# Patient Record
Sex: Female | Born: 1950
Health system: Southern US, Community
[De-identification: ages and names within clinical notes are randomized; demographics above are authoritative.]

## PROBLEM LIST (undated history)

## (undated) DIAGNOSIS — S22000A Wedge compression fracture of unspecified thoracic vertebra, initial encounter for closed fracture: Secondary | ICD-10-CM

## (undated) DIAGNOSIS — C9110 Chronic lymphocytic leukemia of B-cell type not having achieved remission: Secondary | ICD-10-CM

## (undated) DIAGNOSIS — E119 Type 2 diabetes mellitus without complications: Secondary | ICD-10-CM

## (undated) DIAGNOSIS — A64 Unspecified sexually transmitted disease: Secondary | ICD-10-CM

## (undated) DIAGNOSIS — M7122 Synovial cyst of popliteal space [Baker], left knee: Secondary | ICD-10-CM

## (undated) DIAGNOSIS — C801 Malignant (primary) neoplasm, unspecified: Secondary | ICD-10-CM

## (undated) DIAGNOSIS — G709 Myoneural disorder, unspecified: Secondary | ICD-10-CM

## (undated) DIAGNOSIS — J45909 Unspecified asthma, uncomplicated: Secondary | ICD-10-CM

## (undated) DIAGNOSIS — E785 Hyperlipidemia, unspecified: Secondary | ICD-10-CM

## (undated) DIAGNOSIS — G35 Multiple sclerosis: Secondary | ICD-10-CM

## (undated) DIAGNOSIS — A630 Anogenital (venereal) warts: Secondary | ICD-10-CM

## (undated) HISTORY — DX: Wedge compression fracture of unspecified thoracic vertebra, initial encounter for closed fracture: S22.000A

## (undated) HISTORY — PX: ABDOMINAL HYSTERECTOMY: SHX81

## (undated) HISTORY — DX: Chronic lymphocytic leukemia of B-cell type not having achieved remission: C91.10

## (undated) HISTORY — DX: Type 2 diabetes mellitus without complications: E11.9

## (undated) HISTORY — DX: Anogenital (venereal) warts: A63.0

## (undated) HISTORY — DX: Synovial cyst of popliteal space (Baker), left knee: M71.22

## (undated) HISTORY — DX: Unspecified asthma, uncomplicated: J45.909

## (undated) HISTORY — DX: Multiple sclerosis: G35

## (undated) HISTORY — PX: TONSILLECTOMY: SUR1361

## (undated) HISTORY — DX: Hyperlipidemia, unspecified: E78.5

## (undated) HISTORY — DX: Malignant (primary) neoplasm, unspecified: C80.1

## (undated) HISTORY — DX: Unspecified sexually transmitted disease: A64

---

## 2004-04-02 ENCOUNTER — Ambulatory Visit (HOSPITAL_COMMUNITY): Admission: RE | Admit: 2004-04-02 | Discharge: 2004-04-02 | Payer: Self-pay | Admitting: Gastroenterology

## 2004-04-02 ENCOUNTER — Encounter (INDEPENDENT_AMBULATORY_CARE_PROVIDER_SITE_OTHER): Payer: Self-pay | Admitting: Specialist

## 2006-07-18 ENCOUNTER — Encounter: Admission: RE | Admit: 2006-07-18 | Discharge: 2006-10-16 | Payer: Self-pay

## 2009-07-03 ENCOUNTER — Encounter: Payer: Self-pay | Admitting: Internal Medicine

## 2009-09-16 ENCOUNTER — Encounter: Payer: Self-pay | Admitting: Internal Medicine

## 2010-03-29 ENCOUNTER — Encounter: Payer: Self-pay | Admitting: Internal Medicine

## 2010-04-02 ENCOUNTER — Ambulatory Visit: Payer: Self-pay | Admitting: Internal Medicine

## 2010-04-02 ENCOUNTER — Telehealth: Payer: Self-pay | Admitting: Internal Medicine

## 2010-04-02 DIAGNOSIS — G35 Multiple sclerosis: Secondary | ICD-10-CM | POA: Insufficient documentation

## 2010-04-02 DIAGNOSIS — Z8719 Personal history of other diseases of the digestive system: Secondary | ICD-10-CM | POA: Insufficient documentation

## 2010-04-02 DIAGNOSIS — E119 Type 2 diabetes mellitus without complications: Secondary | ICD-10-CM | POA: Insufficient documentation

## 2010-04-02 DIAGNOSIS — J45909 Unspecified asthma, uncomplicated: Secondary | ICD-10-CM | POA: Insufficient documentation

## 2010-04-02 DIAGNOSIS — E785 Hyperlipidemia, unspecified: Secondary | ICD-10-CM | POA: Insufficient documentation

## 2010-04-02 DIAGNOSIS — J309 Allergic rhinitis, unspecified: Secondary | ICD-10-CM | POA: Insufficient documentation

## 2010-04-15 ENCOUNTER — Telehealth: Payer: Self-pay

## 2010-05-27 ENCOUNTER — Ambulatory Visit: Payer: Self-pay | Admitting: Internal Medicine

## 2010-07-20 ENCOUNTER — Telehealth: Payer: Self-pay | Admitting: Internal Medicine

## 2010-12-12 LAB — CONVERTED CEMR LAB
AST: 34 units/L (ref 0–37)
Alkaline Phosphatase: 42 units/L (ref 39–117)
Basophils Relative: 0.3 % (ref 0.0–3.0)
Bilirubin, Direct: 0 mg/dL (ref 0.0–0.3)
CO2: 33 meq/L — ABNORMAL HIGH (ref 19–32)
Calcium: 9.8 mg/dL (ref 8.4–10.5)
Creatinine, Ser: 0.8 mg/dL (ref 0.4–1.2)
Eosinophils Absolute: 0.2 10*3/uL (ref 0.0–0.7)
GFR calc non Af Amer: 79.83 mL/min (ref >60)
Hemoglobin: 12.8 g/dL (ref 12.0–15.0)
Lymphocytes Relative: 28.3 % (ref 12.0–46.0)
MCHC: 34 g/dL (ref 30.0–36.0)
Monocytes Relative: 8.4 % (ref 3.0–12.0)
Neutro Abs: 2.7 10*3/uL (ref 1.4–7.7)
Neutrophils Relative %: 58.4 % (ref 43.0–77.0)
RBC: 4.3 M/uL (ref 3.87–5.11)
Sodium: 147 meq/L — ABNORMAL HIGH (ref 135–145)
Total Protein: 6.7 g/dL (ref 6.0–8.3)
WBC: 4.7 10*3/uL (ref 4.5–10.5)

## 2010-12-16 NOTE — Progress Notes (Signed)
Summary: lisinopril  Phone Note Call from Patient   Caller: Patient Call For: Gordy Savers  MD Reason for Call: Refill Medication Summary of Call: left lisinopril 10 mg once daily off med list - needs refill - [facility] Initial call taken by: Duard Brady LPN,  Apr 02, 2010 9:47 AM  Follow-up for Phone Call        #90 RF 4 Follow-up by: Gordy Savers  MD,  Apr 02, 2010 12:36 PM    New/Updated Medications: LISINOPRIL 20 MG TABS (LISINOPRIL) qd Prescriptions: LISINOPRIL 20 MG TABS (LISINOPRIL) qd  #90 x 3   Entered by:   Duard Brady LPN   Authorized by:   Gordy Savers  MD   Signed by:   Duard Brady LPN on 16/08/9603   Method used:   Faxed to ...       [facility]* (retail)       [address]       [place], [place]  [address]       Ph: [phone] or [phone]       Fax: [phone]   RxID:   [id number]  faxed to pharmacy   KIK

## 2010-12-16 NOTE — Letter (Signed)
Summary: Records from [facility] 2010  Records from [facility] 2010   Imported By: Maryln Gottron 10/01/2010 14:24:14  _____________________________________________________________________  External Attachment:    Type:   Image     Comment:   External Document

## 2010-12-16 NOTE — Assessment & Plan Note (Signed)
Summary: consult re: fall that pt had 4 wks ago/indention in face/cjr   Vital Signs:  Patient profile:   60 year old female Weight:      178 pounds Temp:     98.5 degrees F oral BP sitting:   112 / 70  (left arm) Cuff size:   regular  Vitals Entered By: Duard Brady LPN (May 27, 2010 10:47 AM) CC: fell 7 wks ago - hit [place] with screen door - check "dent"   CC:  fell 7 wks ago - hit [place] with screen door - check "dent".  History of Present Illness: 60 year old patient who is seen today for follow-up.  Says a history of mild type 2 diabetes and  MS.  she fell approximately 7 weeks ago, sustaining facial trauma, especially in the left temporal and periorbital area.  She was concerned about a slight area of indentation involving the left temporal area.  She has had no headaches or any focal neurological symptoms.  Her MS has been stable.  Allergies: 1)  ! Codeine  Past History:  Past Medical History: Reviewed history from 04/02/2010 and no changes required. Allergic rhinitis Asthma Diabetes mellitus, type II Hyperlipidemia multiple sclerosis Rachel Coffey Fax:  [phone] ) Diverticulitis, hx of  Physical Exam  General:  Well-developed,well-nourished,in no acute distress; alert,appropriate and cooperative throughout examination Head:  Normocephalic and atraumatic without obvious abnormalities. No apparent alopecia or balding. the left temporal area was examined and there is no tenderness or abnormalities appreciated Eyes:  No corneal or conjunctival inflammation noted. EOMI. Perrla. Funduscopic exam benign, without hemorrhages, exudates or papilledema. Vision grossly normal. Ears:  External ear exam shows no significant lesions or deformities.  Otoscopic examination reveals clear canals, tympanic membranes are intact bilaterally without bulging, retraction, inflammation or discharge. Hearing is grossly normal bilaterally. Mouth:  Oral mucosa and oropharynx without  lesions or exudates.  Teeth in good repair. Neck:  No deformities, masses, or tenderness noted. Lungs:  Normal respiratory effort, chest expands symmetrically. Lungs are clear to auscultation, no crackles or wheezes.   Impression & Recommendations:  Problem # 1:  MULTIPLE SCLEROSIS (ICD-340)  Problem # 2:  DIABETES MELLITUS, TYPE II (ICD-250.00)  Her updated medication list for this problem includes:    Glipizide 2.5 Mg Xr24h-tab (Glipizide) ..... Bid    Lisinopril 20 Mg Tabs (Lisinopril) ..... Qd  Complete Medication List: 1)  Zolpidem Tartrate 10 Mg Tabs (Zolpidem tartrate) .... One at bedtime as needed for sleep 2)  Trilipix 135 Mg Cpdr (Choline fenofibrate) .... Qhs 3)  Cyclobenzaprine Hcl 10 Mg Tabs (Cyclobenzaprine hcl) .... Qhs 4)  Glipizide 2.5 Mg Xr24h-tab (Glipizide) .... Bid 5)  Vitamin D3 1000 Unit Caps (Cholecalciferol) .... Qd 6)  Cinnamon 500 Mg Tabs (Cinnamon) .... 2 qd 7)  Black Cohosh 540 Mg Caps (Black cohosh) .... Qd 8)  Avonex Prefilled 30 Mcg/0.71ml Kit (Interferon beta-1a) 9)  Lisinopril 20 Mg Tabs (Lisinopril) .... Qd  Patient Instructions: 1)  Please schedule a follow-up appointment in 4 months. 2)  Limit your Sodium (Salt). 3)  It is important that you exercise regularly at least 20 minutes 5 times a week. If you develop chest pain, have severe difficulty breathing, or feel very tired , stop exercising immediately and seek medical attention. 4)  Take calcium +Vitamin D daily. 5)  Check your blood sugars regularly. If your readings are usually above : or below 70 you should contact our office. 6)  It is important that  your Diabetic A1c level is checked every 3 months. 7)  See your eye doctor yearly to check for diabetic eye damage.

## 2010-12-16 NOTE — Assessment & Plan Note (Signed)
Summary: NEW TO EST//CCM pt rsc/njr   Vital Signs:  Patient profile:   60 year old female Height:      67 inches Weight:      180 pounds BMI:     28.29 Temp:     98.0 degrees F oral BP sitting:   118 / 70  (left arm) Cuff size:   regular  Vitals Entered By: Duard Brady LPN (Apr 02, 2010 8:32 AM) CC: new to establish   CC:  new to establish.  History of Present Illness: 60 year old patient who is seen today to establish with our practice.  She is followed by neurology in [place].  Due to MS first diagnosed in 27-Mar-1998.  She has a history of type 2 diabetes, diagnosed in 27-Mar-2004.  She has been on glipizide with much glycemic control.  Her last him go.  A1c.1.  She denies any hypoglycemic symptoms.  She has recently retired.  She has a history of dyslipidemia, controlled on  fibrate therapy  Preventive Screening-Counseling & Management  Alcohol-Tobacco     Smoking Status: quit  Allergies (verified): 1)  ! Codeine  Past History:  Past Medical History: Allergic rhinitis Asthma Diabetes mellitus, type II Hyperlipidemia multiple sclerosis Junie Spencer Fax:  [phone] ) Diverticulitis, hx of  Past Surgical History: Hysterectomy 1992 colonoscopy Mar 27, 2008 Pap smear  2009/03/27 mammogram Mar 27, 2010 Family History: father died age 71, complications of cirrhosis, history of type 2 diabetes mother died Ates 80, palpitations of COPD one sister, history of scoliosis and congenital heart disease, as well as dyslipidemia  Social History: Reviewed history and no changes required. Retired Married discontinued tobacco 5 years agoSmoking Status:  quit  Review of Systems  The patient denies anorexia, fever, weight loss, weight gain, vision loss, decreased hearing, hoarseness, chest pain, syncope, dyspnea on exertion, peripheral edema, prolonged cough, headaches, hemoptysis, abdominal pain, melena, hematochezia, severe indigestion/heartburn, hematuria, incontinence, genital sores, muscle  weakness, suspicious skin lesions, transient blindness, difficulty walking, depression, unusual weight change, abnormal bleeding, enlarged lymph nodes, angioedema, and breast masses.    Physical Exam  General:  Well-developed,well-nourished,in no acute distress; alert,appropriate and cooperative throughout examination;  blood pressure low normal Head:  Normocephalic and atraumatic without obvious abnormalities. No apparent alopecia or balding. Eyes:  No corneal or conjunctival inflammation noted. EOMI. Perrla. Funduscopic exam benign, without hemorrhages, exudates or papilledema. Vision grossly normal. Ears:  External ear exam shows no significant lesions or deformities.  Otoscopic examination reveals clear canals, tympanic membranes are intact bilaterally without bulging, retraction, inflammation or discharge. Hearing is grossly normal bilaterally. Nose:  External nasal examination shows no deformity or inflammation. Nasal mucosa are pink and moist without lesions or exudates. Mouth:  Oral mucosa and oropharynx without lesions or exudates.  Teeth in good repair. Neck:  No deformities, masses, or tenderness noted. Chest Wall:  No deformities, masses, or tenderness noted. Lungs:  Normal respiratory effort, chest expands symmetrically. Lungs are clear to auscultation, no crackles or wheezes. Heart:  Normal rate and regular rhythm. S1 and S2 normal without gallop, murmur, click, rub or other extra sounds. Abdomen:  Bowel sounds positive,abdomen soft and non-tender without masses, organomegaly or hernias noted. Msk:  No deformity or scoliosis noted of thoracic or lumbar spine.   Pulses:  R and L carotid,radial,femoral,dorsalis pedis and posterior tibial pulses are full and equal bilaterally Extremities:  No clubbing, cyanosis, edema, or deformity noted with normal full range of motion of all joints.   Neurologic:  slight diminished  sensation to light touch, plantar aspect, left foot Skin:  Intact  without suspicious lesions or rashes Cervical Nodes:  No lymphadenopathy noted Axillary Nodes:  No palpable lymphadenopathy Inguinal Nodes:  No significant adenopathy Psych:  Cognition and judgment appear intact. Alert and cooperative with normal attention span and concentration. No apparent delusions, illusions, hallucinations  Diabetes Management Exam:    Eye Exam:       Eye Exam done here today          Results: normal   Impression & Recommendations:  Problem # 1:  MULTIPLE SCLEROSIS (ICD-340)  Orders: Venipuncture ([address]) TLB-BMP (Basic Metabolic Panel-BMET) (80048-METABOL) TLB-CBC Platelet - w/Differential (85025-CBCD) TLB-Hepatic/Liver Function Pnl (80076-HEPATIC) TLB-TSH (Thyroid Stimulating Hormone) (84443-TSH)  Problem # 2:  HYPERLIPIDEMIA (ICD-272.4)  Her updated medication list for this problem includes:    Trilipix 135 Mg Cpdr (Choline fenofibrate) ..... Qhs  Orders: Venipuncture (60454) TLB-BMP (Basic Metabolic Panel-BMET) (80048-METABOL) TLB-CBC Platelet - w/Differential (85025-CBCD) TLB-Hepatic/Liver Function Pnl (80076-HEPATIC) TLB-TSH (Thyroid Stimulating Hormone) (84443-TSH)  Problem # 3:  DIABETES MELLITUS, TYPE II (ICD-250.00)  Her updated medication list for this problem includes:    Glipizide 2.5 Mg Xr24h-tab (Glipizide) ..... Bid  Orders: Venipuncture (09811) TLB-BMP (Basic Metabolic Panel-BMET) (80048-METABOL) TLB-CBC Platelet - w/Differential (85025-CBCD) TLB-Hepatic/Liver Function Pnl (80076-HEPATIC) TLB-TSH (Thyroid Stimulating Hormone) (84443-TSH) TLB-A1C / Hgb A1C (Glycohemoglobin) (83036-A1C)  Complete Medication List: 1)  Zolpidem Tartrate 10 Mg Tabs (Zolpidem tartrate) .... One at bedtime as needed for sleep 2)  Trilipix 135 Mg Cpdr (Choline fenofibrate) .... Qhs 3)  Cyclobenzaprine Hcl 10 Mg Tabs (Cyclobenzaprine hcl) .... Qhs 4)  Glipizide 2.5 Mg Xr24h-tab (Glipizide) .... Bid 5)  Vitamin D3 1000 Unit Caps (Cholecalciferol) ....  Qd 6)  Cinnamon 500 Mg Tabs (Cinnamon) .... 2 qd 7)  Black Cohosh 540 Mg Caps (Black cohosh) .... Qd 8)  Avonex Prefilled 30 Mcg/0.32ml Kit (Interferon beta-1a)  Patient Instructions: 1)  Please schedule a follow-up appointment in 3 months. 2)  It is important that you exercise regularly at least 20 minutes 5 times a week. If you develop chest pain, have severe difficulty breathing, or feel very tired , stop exercising immediately and seek medical attention. 3)  Take calcium +Vitamin D daily. 4)  Check your blood sugars regularly. If your readings are usually above : or below 70 you should contact our office. 5)  It is important that your Diabetic A1c level is checked every 3 months. 6)  See your eye doctor yearly to check for diabetic eye damage. Prescriptions: GLIPIZIDE 2.5 MG XR24H-TAB (GLIPIZIDE) bid  #180 x 4   Entered and Authorized by:   Gordy Savers  MD   Signed by:   Gordy Savers  MD on 04/02/2010   Method used:   Print then Give to Patient   RxID:   [id number] CYCLOBENZAPRINE HCL 10 MG TABS (CYCLOBENZAPRINE HCL) qhs  #90 x 4   Entered and Authorized by:   Gordy Savers  MD   Signed by:   Gordy Savers  MD on 04/02/2010   Method used:   Print then Give to Patient   RxID:   [id number] TRILIPIX 135 MG CPDR (CHOLINE FENOFIBRATE) qhs  #90 x 4   Entered and Authorized by:   Gordy Savers  MD   Signed by:   Gordy Savers  MD on 04/02/2010   Method used:   Print then Give to Patient   RxID:   [id number] ZOLPIDEM TARTRATE 10 MG TABS (ZOLPIDEM  TARTRATE) one at bedtime as needed for sleep  #30 x 1   Entered and Authorized by:   Gordy Savers  MD   Signed by:   Gordy Savers  MD on 04/02/2010   Method used:   Print then Give to Patient   RxID:   [id number]

## 2010-12-16 NOTE — Letter (Signed)
Summary: [facility]  [facility]   Imported By: Maryln Gottron 04/07/2010 11:26:29  _____________________________________________________________________  External Attachment:    Type:   Image     Comment:   External Document

## 2010-12-16 NOTE — Progress Notes (Signed)
Summary: refill ventolin hfa  Phone Note Refill Request Message from:  Fax from Pharmacy on July 20, 2010 1:33 PM  ventolin hfa 20mcg/inh aer gm#18 sig: 2 puffs qid as needed  not currently on med list - was being rx'd by Dr. Laqueta Due , they are requesting we fill.  [facility]  [phone]   Method Requested: Fax to Local Pharmacy Initial call taken by: Duard Brady LPN,  July 20, 2010 1:35 PM  Follow-up for Phone Call        OK  RF 2 Follow-up by: Gordy Savers  MD,  July 20, 2010 2:28 PM    New/Updated Medications: VENTOLIN HFA 108 (90 BASE) MCG/ACT AERS (ALBUTEROL SULFATE) 2 puffs qid prn Prescriptions: VENTOLIN HFA 108 (90 BASE) MCG/ACT AERS (ALBUTEROL SULFATE) 2 puffs qid prn  #1 x 2   Entered by:   Duard Brady LPN   Authorized by:   Gordy Savers  MD   Signed by:   Duard Brady LPN on 45/40/9811   Method used:   Faxed to ...       [facility]* (retail)       [address]       [place], [place]  [address]       Ph: [phone] or [phone]       Fax: [phone]   RxID:   [id number]

## 2010-12-16 NOTE — Progress Notes (Signed)
Summary: copy of bood work  Phone Note Call from Patient   Caller: Patient Summary of Call: Please mail or e-mail copy of blood work to [email or web address] Initial call taken by: Kathrynn Speed CMA,  April 15, 2010 10:39 AM  Follow-up for Phone Call        pt aware - copy of labs being mailed KIK Follow-up by: Duard Brady LPN,  April 15, 1609 12:10 PM

## 2011-04-01 NOTE — Op Note (Signed)
NAME:  Rachel Coffey, Rachel Coffey [facility]                         ACCOUNT NO.:  [id number]   MEDICAL RECORD NO.:  [id number]                   PATIENT TYPE:  AMB   LOCATION:  ENDO                                 FACILITY:  [facility]   PHYSICIAN:  John C. Madilyn Fireman, M.D.                 DATE OF BIRTH:  1950/12/23   DATE OF PROCEDURE:  04/02/2004  DATE OF DISCHARGE:                                 OPERATIVE REPORT   PROCEDURE:  Colonoscopy.   INDICATIONS FOR PROCEDURE:  Colon cancer screening in a 60 year old patient  with no prior screening.   DESCRIPTION OF PROCEDURE:  The patient was placed in the left lateral  decubitus position then placed on the pulse monitor with continuous low flow  oxygen delivered by nasal cannula. She was sedated with 100 mcg IV fentanyl  and 10 mg IV Versed. The Olympus video colonoscope was inserted into the  rectum and advanced to the cecum, confirmed by transillumination at  McBurney's point and visualization of the ileocecal valve and appendiceal  orifice. The prep was excellent. The cecum and ascending colon appeared  normal with no masses, polyps, diverticula or other mucosal abnormalities.  Within the transverse colon, there was a 6 mm polyp that was fulgurated by  hot biopsy. The remainder of the transverse, descending and rectum appeared  normal with no further polyps, masses, diverticula or other mucosal  abnormalities.  The scope was then withdrawn and the patient returned to the  recovery room in stable condition. She tolerated the procedure well and  there were no immediate complications.   IMPRESSION:  6 mm transverse colon polyp otherwise normal study.   PLAN:  Await histology to determine method and interval for future colon  screening.                                               John C. Madilyn Fireman, M.D.    [facility]MED[facility]  D:  04/02/2004  T:  04/02/2004  Job:  [id number]   cc:   Laqueta Due, M.D.  [place], [place]

## 2012-02-23 ENCOUNTER — Other Ambulatory Visit: Payer: Self-pay | Admitting: Internal Medicine

## 2012-07-11 ENCOUNTER — Other Ambulatory Visit: Payer: Self-pay | Admitting: Dermatology

## 2012-11-20 ENCOUNTER — Other Ambulatory Visit: Payer: Self-pay | Admitting: Gastroenterology

## 2012-11-20 DIAGNOSIS — R109 Unspecified abdominal pain: Secondary | ICD-10-CM

## 2012-11-20 DIAGNOSIS — R197 Diarrhea, unspecified: Secondary | ICD-10-CM

## 2012-11-21 ENCOUNTER — Other Ambulatory Visit: Payer: Self-pay

## 2012-11-23 ENCOUNTER — Ambulatory Visit
Admission: RE | Admit: 2012-11-23 | Discharge: 2012-11-23 | Disposition: A | Discharge status: Home / Self Care | Payer: 59 | Source: Ambulatory Visit | Attending: Gastroenterology | Admitting: Gastroenterology

## 2012-11-23 DIAGNOSIS — R197 Diarrhea, unspecified: Secondary | ICD-10-CM

## 2012-11-23 DIAGNOSIS — R109 Unspecified abdominal pain: Secondary | ICD-10-CM

## 2013-01-31 ENCOUNTER — Encounter (INDEPENDENT_AMBULATORY_CARE_PROVIDER_SITE_OTHER): Payer: Self-pay | Admitting: Surgery

## 2013-02-06 ENCOUNTER — Encounter (INDEPENDENT_AMBULATORY_CARE_PROVIDER_SITE_OTHER): Payer: Self-pay | Admitting: Surgery

## 2013-02-06 ENCOUNTER — Ambulatory Visit (INDEPENDENT_AMBULATORY_CARE_PROVIDER_SITE_OTHER): Payer: 59 | Admitting: Surgery

## 2013-02-06 VITALS — BP 102/60 | HR 81 | Temp 98.8°F | Resp 18 | Ht 67.5 in | Wt 181.6 lb

## 2013-02-06 DIAGNOSIS — K8010 Calculus of gallbladder with chronic cholecystitis without obstruction: Secondary | ICD-10-CM

## 2013-02-06 NOTE — Progress Notes (Signed)
General Surgery [facility]  Chief Complaint  Patient presents with  . New Evaluation    eval symptomatic GB - referral by Dr. Dorena Cookey and Dr. Sigmund Hazel    HISTORY: Patient is a 62 year old white female referred by her gastroenterologist for consideration for cholecystectomy. Patient has had intermittent episodes of epigastric abdominal pain radiating to the back. These to been associated with nausea, vomiting, and diarrhea. The most severe episode occurred in January 2014. She denies fevers or chills. There is no history of jaundice or acholic stools.  Workup included abdominal ultrasound performed on 11/23/2012. This documented multiple gallstones and probable fatty infiltration of the liver. Laboratory studies show normal liver function test.  Patient has had previous gynecologic surgery. She also has a history of multiple sclerosis and has been on medication. This has somewhat complicated her symptoms.  Past Medical History  Diagnosis Date  . Diabetes mellitus without complication   . Asthma   . Hyperlipidemia   . MS (multiple sclerosis)      Current Outpatient Prescriptions  Medication Sig Dispense Refill  . ALPRAZolam (XANAX) 0.5 MG tablet Take 0.5 mg by mouth at bedtime as needed for sleep.      Marland Kitchen ascorbic acid (VITAMIN C) 500 MG tablet Take 500 mg by mouth daily.      . calcium-vitamin D 250-100 MG-UNIT per tablet Take 1 tablet by mouth 2 (two) times daily.      . Choline Fenofibrate (TRILIPIX) 135 MG capsule Take 135 mg by mouth daily.      . cyclobenzaprine (FLEXERIL) 10 MG tablet Take 10 mg by mouth 3 (three) times daily as needed for muscle spasms.      Marland Kitchen glipiZIDE (GLUCOTROL XL) 2.5 MG 24 hr tablet Take 2.5 mg by mouth daily.      Marland Kitchen lisinopril (PRINIVIL,ZESTRIL) 10 MG tablet Take 10 mg by mouth daily.      . Multiple Vitamins-Minerals (MULTIVITAMIN WITH MINERALS) tablet Take 1 tablet by mouth daily.      . VENTOLIN HFA 108 (90 BASE) MCG/ACT  inhaler INHALE TWO PUFFS FOUR TIMES A DAY AS    NEEDED  1 Inhaler  0   No current facility-administered medications for this visit.     Allergies  Allergen Reactions  . Codeine   . Hydrocodone Itching     Family History  Problem Relation Age of Onset  . COPD Mother   . Heart disease Mother   . Diabetes Father   . Heart disease Sister      History   Social History  . Marital Status: Married    Spouse Name: N/A    Number of Children: N/A  . Years of Education: N/A   Social History Main Topics  . Smoking status: Former Smoker    Types: Cigarettes    Quit date: 01/31/2005  . Smokeless tobacco: Never Used  . Alcohol Use: No  . Drug Use: No  . Sexually Active: None   Other Topics Concern  . None   Social History Narrative  . None     REVIEW OF SYSTEMS - PERTINENT POSITIVES ONLY: Epigastric abdominal pain with radiation to back. Intermittent episodes of nausea and vomiting. Frequent episodes of diarrhea. No history of hepatobiliary or pancreatic disease.  EXAM: Filed Vitals:   02/06/13 0911  BP: 102/60  Pulse: 81  Temp: 98.8 F (37.1 C)  Resp: 18    HEENT: normocephalic; pupils equal and reactive; sclerae clear; dentition good; mucous membranes  moist NECK:  symmetric on extension; no palpable anterior or posterior cervical lymphadenopathy; no supraclavicular masses; no tenderness CHEST: clear to auscultation bilaterally without rales, rhonchi, or wheezes CARDIAC: regular rate and rhythm without significant murmur; peripheral pulses are full ABDOMEN: soft without distension; bowel sounds present; no mass; no hepatosplenomegaly; no hernia; well-healed incisions below umbilicus and in the Pfannenstiel location; mild tenderness in the right upper quadrant and epigastrium EXT:  non-tender without edema; no deformity NEURO: no gross focal deficits; no sign of tremor   LABORATORY RESULTS: See [facility] ([facility]Epic) for most recent results   RADIOLOGY  RESULTS: See [facility] (CHL-Epic) for most recent results   IMPRESSION: #1 symptomatic cholelithiasis, probable chronic cholecystitis #2 personal history of multiple sclerosis  PLAN: I discussed at length with the patient and her husband the indications for surgery. I provided them with written literature to review regarding laparoscopic gallbladder surgery. I have recommended laparoscopic cholecystectomy with intraoperative cholangiography. We discussed risk and benefits of the procedure including the possibility of conversion to open surgery. We have discussed the hospital stay to be anticipated and her recovery. She and her husband understand and wish to proceed with surgery in the near future.  The risks and benefits of the procedure have been discussed at length with the patient.  The patient understands the proposed procedure, potential alternative treatments, and the course of recovery to be expected.  All of the patient's questions have been answered at this time.  The patient wishes to proceed with surgery.  Velora Heckler, MD, FACS General & Endocrine Surgery [facility]   Visit Diagnoses: 1. Cholelithiasis with cholecystitis     Primary Care Physician: Neldon Labella, MD

## 2013-02-06 NOTE — Patient Instructions (Signed)
  [facility]  LAPAROSCOPIC SURGERY - POST-OP INSTRUCTIONS  Always review your discharge instruction sheet given to you by the facility where your surgery was performed.  A prescription for pain medication may be given to you upon discharge.  Take your pain medication as prescribed.  If narcotic pain medicine is not needed, then you may take acetaminophen (Tylenol) or ibuprofen (Advil) as needed.  Take your usually prescribed medications unless otherwise directed.  If you need a refill on your pain medication, please contact your pharmacy.  They will contact our office to request authorization. Prescriptions will not be filled after 5 P.M. or on weekends.  You should follow a light diet the first few days after arrival home, such as soup and crackers or toast.  Be sure to include plenty of fluids daily.  Most patients will experience some swelling and bruising in the area of the incisions.  Ice packs will help.  Swelling and bruising can take several days to resolve.   It is common to experience some constipation if taking pain medication after surgery.  Increasing fluid intake and taking a stool softener (such as Colace) will usually help or prevent this problem from occurring.  A mild laxative (Milk of Magnesia or Miralax) should be taken according to package instructions if there are no bowel movements after 48 hours.  Unless discharge instructions indicate otherwise, you may remove your bandages 24-48 hours after surgery, and you may shower at that time.  You may have steri-strips (small skin tapes) in place directly over the incision.  These strips should be left on the skin for 7-10 days.  If your surgeon used skin glue on the incision, you may shower in 24 hours.  The glue will flake off over the next 2-3 weeks.  Any sutures or staples will be removed at the office during your follow-up visit.  ACTIVITIES:  You may resume regular (light) daily activities beginning the  next day-such as daily self-care, walking, climbing stairs-gradually increasing activities as tolerated.  You may have sexual intercourse when it is comfortable.  Refrain from any heavy lifting or straining until approved by your doctor.  You may drive when you are no longer taking prescription pain medication, you can comfortably wear a seatbelt, and you can safely maneuver your car and apply brakes.  You should see your doctor in the office for a follow-up appointment approximately 2-3 weeks after your surgery.  Make sure that you call for this appointment within a day or two after you arrive home to insure a convenient appointment time.  WHEN TO CALL YOUR DOCTOR: 1. Fever over 101.0 2. Inability to urinate 3. Continued bleeding from incision 4. Increased pain, redness, or drainage from the incision 5. Increasing abdominal pain  The clinic staff is available to answer your questions during regular business hours.  Please don't hesitate to call and ask to speak to one of the nurses for clinical concerns.  If you have a medical emergency, go to the nearest emergency room or call 911.  A surgeon from [facility] is always on call for the hospital.  Jack Bolio M. Amai Cappiello, MD, FACS [facility] Office: [phone] Toll Free:  [phone] FAX [phone]  Web site: [email or web address] 

## 2013-02-11 ENCOUNTER — Encounter (HOSPITAL_COMMUNITY): Payer: Self-pay | Admitting: Pharmacy Technician

## 2013-02-19 ENCOUNTER — Encounter (HOSPITAL_COMMUNITY): Payer: Self-pay

## 2013-02-19 ENCOUNTER — Encounter (HOSPITAL_COMMUNITY)
Admission: RE | Admit: 2013-02-19 | Discharge: 2013-02-19 | Disposition: A | Discharge status: Home / Self Care | Payer: 59 | Source: Ambulatory Visit | Attending: Surgery | Admitting: Surgery

## 2013-02-19 HISTORY — DX: Myoneural disorder, unspecified: G70.9

## 2013-02-19 HISTORY — PX: COLONOSCOPY: SHX174

## 2013-02-19 LAB — BASIC METABOLIC PANEL
BUN: 19 mg/dL (ref 6–23)
Creatinine, Ser: 0.55 mg/dL (ref 0.50–1.10)
GFR calc Af Amer: >90 mL/min (ref >90)
GFR calc non Af Amer: >90 mL/min (ref >90)
Potassium: 4.3 mEq/L (ref 3.5–5.1)

## 2013-02-19 LAB — SURGICAL PCR SCREEN: MRSA, PCR: NEGATIVE

## 2013-02-19 NOTE — Pre-Procedure Instructions (Addendum)
02-19-13 recent labs -3-18-14CBC/d, Lipase, CMP(02-06-13), with chart. BMP done, EKG done today.W.Shreyansh Tiffany,RN

## 2013-02-19 NOTE — Patient Instructions (Addendum)
20 Rachel Coffey  02/19/2013   Your procedure is scheduled on: 4-14  -2014  Report to [facility] at    0530    AM.  Call this number if you have problems the morning of surgery: [phone]  Or Presurgical Testing [phone](Colisha Redler)     Do not eat food:After Midnight.    Take these medicines the morning of surgery with A SIP OF WATER: Bring Ventolin inhaler and use if needed.   Do not wear jewelry, make-up or nail polish.  Do not wear lotions, powders, or perfumes. You may wear deodorant.  Do not shave 12 hours prior to first CHG shower(legs and under arms).(face and neck okay.)  Do not bring valuables to the hospital.  Contacts, dentures or bridgework,body piercing,  may not be worn into surgery.  Leave suitcase in the car. After surgery it may be brought to your room.  For patients admitted to the hospital, checkout time is 11:00 AM the day of discharge.   Patients discharged the day of surgery will not be allowed to drive home. Must have responsible person with you x 24 hours once discharged.  Name and phone number of your driver: mahima, hottle - [phone]  Special Instructions: CHG(Chlorhedine 4%-"Hibiclens","Betasept","Aplicare") Shower Use Special Wash: see special instructions.(avoid face and genitals)   Please read over the following fact sheets that you were given: MRSA Information, Blood Transfusion fact sheet, Incentive Spirometry Instruction.    Failure to follow these instructions may result in Cancellation of your surgery.   Patient signature_______________________________________________________

## 2013-02-21 ENCOUNTER — Encounter (INDEPENDENT_AMBULATORY_CARE_PROVIDER_SITE_OTHER): Payer: Self-pay

## 2013-02-25 ENCOUNTER — Encounter (HOSPITAL_COMMUNITY): Payer: Self-pay | Admitting: Anesthesiology

## 2013-02-25 ENCOUNTER — Encounter (HOSPITAL_COMMUNITY): Payer: Self-pay | Admitting: *Deleted

## 2013-02-25 ENCOUNTER — Observation Stay (HOSPITAL_COMMUNITY)
Admission: RE | Admit: 2013-02-25 | Discharge: 2013-02-26 | Disposition: A | Discharge status: Home / Self Care | Payer: 59 | Source: Ambulatory Visit | Attending: Surgery | Admitting: Surgery

## 2013-02-25 ENCOUNTER — Encounter (HOSPITAL_COMMUNITY)
Admission: RE | Disposition: A | Discharge status: Home / Self Care | Payer: Self-pay | Source: Ambulatory Visit | Attending: Surgery

## 2013-02-25 ENCOUNTER — Ambulatory Visit (HOSPITAL_COMMUNITY): Payer: 59 | Admitting: Anesthesiology

## 2013-02-25 ENCOUNTER — Ambulatory Visit (HOSPITAL_COMMUNITY): Payer: 59

## 2013-02-25 DIAGNOSIS — Z79899 Other long term (current) drug therapy: Secondary | ICD-10-CM | POA: Insufficient documentation

## 2013-02-25 DIAGNOSIS — Z01810 Encounter for preprocedural cardiovascular examination: Secondary | ICD-10-CM | POA: Insufficient documentation

## 2013-02-25 DIAGNOSIS — K8010 Calculus of gallbladder with chronic cholecystitis without obstruction: Principal | ICD-10-CM | POA: Diagnosis present

## 2013-02-25 DIAGNOSIS — E119 Type 2 diabetes mellitus without complications: Secondary | ICD-10-CM | POA: Insufficient documentation

## 2013-02-25 DIAGNOSIS — E785 Hyperlipidemia, unspecified: Secondary | ICD-10-CM | POA: Insufficient documentation

## 2013-02-25 DIAGNOSIS — Z01812 Encounter for preprocedural laboratory examination: Secondary | ICD-10-CM | POA: Insufficient documentation

## 2013-02-25 DIAGNOSIS — G35 Multiple sclerosis: Secondary | ICD-10-CM | POA: Insufficient documentation

## 2013-02-25 HISTORY — PX: CHOLECYSTECTOMY: SHX55

## 2013-02-25 LAB — GLUCOSE, CAPILLARY
Glucose-Capillary: 150 mg/dL — ABNORMAL HIGH (ref 70–99)
Glucose-Capillary: 113 mg/dL — ABNORMAL HIGH (ref 70–99)

## 2013-02-25 SURGERY — LAPAROSCOPIC CHOLECYSTECTOMY WITH INTRAOPERATIVE CHOLANGIOGRAM
Anesthesia: General | Site: Abdomen | Wound class: Contaminated

## 2013-02-25 MED ORDER — CEFAZOLIN SODIUM-DEXTROSE 2-3 GM-% IV SOLR
2.0000 g | INTRAVENOUS | Status: AC
  Administered 2013-02-25: 2 g via INTRAVENOUS

## 2013-02-25 MED ORDER — GLYCOPYRROLATE 0.2 MG/ML IJ SOLN
INTRAMUSCULAR | Status: DC | PRN
  Administered 2013-02-25 (×2): .3 mg via INTRAVENOUS

## 2013-02-25 MED ORDER — SODIUM CHLORIDE 0.9 % IJ SOLN
INTRAMUSCULAR | Status: DC | PRN
  Administered 2013-02-25: 08:00:00

## 2013-02-25 MED ORDER — FENTANYL CITRATE 0.05 MG/ML IJ SOLN
INTRAMUSCULAR | Status: DC | PRN
  Administered 2013-02-25: 25 ug via INTRAVENOUS
  Administered 2013-02-25: 100 ug via INTRAVENOUS
  Administered 2013-02-25: 25 ug via INTRAVENOUS
  Administered 2013-02-25: 100 ug via INTRAVENOUS

## 2013-02-25 MED ORDER — IOHEXOL 300 MG/ML  SOLN
INTRAMUSCULAR | Status: AC
  Filled 2013-02-25: qty 1

## 2013-02-25 MED ORDER — ALPRAZOLAM 0.5 MG PO TABS: 0.5000 mg | ORAL_TABLET | Freq: Every day | ORAL | Status: DC | PRN

## 2013-02-25 MED ORDER — KCL IN DEXTROSE-NACL 20-5-0.45 MEQ/L-%-% IV SOLN
INTRAVENOUS | Status: DC
  Administered 2013-02-25 (×2): via INTRAVENOUS
  Filled 2013-02-25 (×2): qty 1000

## 2013-02-25 MED ORDER — HYDROMORPHONE HCL PF 1 MG/ML IJ SOLN
0.2500 mg | INTRAMUSCULAR | Status: DC | PRN
  Administered 2013-02-25 (×2): 0.5 mg via INTRAVENOUS

## 2013-02-25 MED ORDER — LIDOCAINE HCL (CARDIAC) 20 MG/ML IV SOLN
INTRAVENOUS | Status: DC | PRN
  Administered 2013-02-25: 80 mg via INTRAVENOUS

## 2013-02-25 MED ORDER — LACTATED RINGERS IR SOLN
Status: DC | PRN
  Administered 2013-02-25: 1

## 2013-02-25 MED ORDER — GLIPIZIDE ER 2.5 MG PO TB24
2.5000 mg | ORAL_TABLET | Freq: Two times a day (BID) | ORAL | Status: DC
  Administered 2013-02-25 – 2013-02-26 (×2): 2.5 mg via ORAL
  Filled 2013-02-25 (×4): qty 1

## 2013-02-25 MED ORDER — INSULIN ASPART 100 UNIT/ML ~~LOC~~ SOLN: 0.0000 [IU] | Freq: Three times a day (TID) | SUBCUTANEOUS | Status: DC

## 2013-02-25 MED ORDER — PROMETHAZINE HCL 25 MG/ML IJ SOLN
6.2500 mg | INTRAMUSCULAR | Status: DC | PRN
  Administered 2013-02-25: 12.5 mg via INTRAVENOUS

## 2013-02-25 MED ORDER — ONDANSETRON HCL 4 MG/2ML IJ SOLN
INTRAMUSCULAR | Status: DC | PRN
  Administered 2013-02-25: 4 mg via INTRAVENOUS

## 2013-02-25 MED ORDER — CYCLOBENZAPRINE HCL 10 MG PO TABS
10.0000 mg | ORAL_TABLET | Freq: Three times a day (TID) | ORAL | Status: DC | PRN
  Filled 2013-02-25: qty 1

## 2013-02-25 MED ORDER — INSULIN ASPART 100 UNIT/ML ~~LOC~~ SOLN: 0.0000 [IU] | Freq: Every day | SUBCUTANEOUS | Status: DC

## 2013-02-25 MED ORDER — LACTATED RINGERS IV SOLN
INTRAVENOUS | Status: DC | PRN
  Administered 2013-02-25: 07:00:00 via INTRAVENOUS

## 2013-02-25 MED ORDER — CEFAZOLIN SODIUM-DEXTROSE 2-3 GM-% IV SOLR
INTRAVENOUS | Status: AC
  Filled 2013-02-25: qty 50

## 2013-02-25 MED ORDER — HYDROMORPHONE HCL PF 1 MG/ML IJ SOLN
INTRAMUSCULAR | Status: AC
  Filled 2013-02-25: qty 2

## 2013-02-25 MED ORDER — PROPOFOL 10 MG/ML IV BOLUS
INTRAVENOUS | Status: DC | PRN
  Administered 2013-02-25: 120 mg via INTRAVENOUS

## 2013-02-25 MED ORDER — NEOSTIGMINE METHYLSULFATE 1 MG/ML IJ SOLN
INTRAMUSCULAR | Status: DC | PRN
  Administered 2013-02-25 (×2): 2 mg via INTRAVENOUS

## 2013-02-25 MED ORDER — TRAMADOL HCL 50 MG PO TABS
50.0000 mg | ORAL_TABLET | Freq: Four times a day (QID) | ORAL | Status: DC | PRN
  Administered 2013-02-25 – 2013-02-26 (×3): 50 mg via ORAL
  Filled 2013-02-25 (×3): qty 1

## 2013-02-25 MED ORDER — ALBUTEROL SULFATE HFA 108 (90 BASE) MCG/ACT IN AERS
2.0000 | INHALATION_SPRAY | Freq: Four times a day (QID) | RESPIRATORY_TRACT | Status: DC | PRN
  Filled 2013-02-25: qty 6.7

## 2013-02-25 MED ORDER — PROMETHAZINE HCL 25 MG/ML IJ SOLN
12.5000 mg | INTRAMUSCULAR | Status: DC | PRN
  Administered 2013-02-26: 12.5 mg via INTRAVENOUS
  Filled 2013-02-25: qty 1

## 2013-02-25 MED ORDER — ACETAMINOPHEN 325 MG PO TABS: 650.0000 mg | ORAL_TABLET | ORAL | Status: DC | PRN

## 2013-02-25 MED ORDER — KCL IN DEXTROSE-NACL 20-5-0.45 MEQ/L-%-% IV SOLN
INTRAVENOUS | Status: AC
  Administered 2013-02-25: 10:00:00
  Filled 2013-02-25: qty 1000

## 2013-02-25 MED ORDER — 0.9 % SODIUM CHLORIDE (POUR BTL) OPTIME
TOPICAL | Status: DC | PRN
  Administered 2013-02-25: 1000 mL

## 2013-02-25 MED ORDER — PROMETHAZINE HCL 25 MG/ML IJ SOLN
INTRAMUSCULAR | Status: AC
  Filled 2013-02-25: qty 1

## 2013-02-25 MED ORDER — LISINOPRIL 10 MG PO TABS
10.0000 mg | ORAL_TABLET | Freq: Every morning | ORAL | Status: DC
  Administered 2013-02-25 – 2013-02-26 (×2): 10 mg via ORAL
  Filled 2013-02-25 (×2): qty 1

## 2013-02-25 MED ORDER — ROCURONIUM BROMIDE 100 MG/10ML IV SOLN
INTRAVENOUS | Status: DC | PRN
  Administered 2013-02-25: 25 mg via INTRAVENOUS
  Administered 2013-02-25: 10 mg via INTRAVENOUS

## 2013-02-25 MED ORDER — BUPIVACAINE-EPINEPHRINE 0.25% -1:200000 IJ SOLN
INTRAMUSCULAR | Status: AC
  Filled 2013-02-25: qty 1

## 2013-02-25 MED ORDER — BUPIVACAINE-EPINEPHRINE 0.5% -1:200000 IJ SOLN
INTRAMUSCULAR | Status: DC | PRN
  Administered 2013-02-25: 20 mL

## 2013-02-25 MED ORDER — HYDROMORPHONE HCL PF 1 MG/ML IJ SOLN
1.0000 mg | INTRAMUSCULAR | Status: DC | PRN
  Administered 2013-02-25 – 2013-02-26 (×4): 1 mg via INTRAVENOUS
  Filled 2013-02-25 (×4): qty 1

## 2013-02-25 SURGICAL SUPPLY — 35 items
APPLIER CLIP ROT 10 11.4 M/L (STAPLE) ×2
BENZOIN TINCTURE PRP APPL 2/3 (GAUZE/BANDAGES/DRESSINGS) ×2 IMPLANT
CABLE HIGH FREQUENCY MONO STRZ (ELECTRODE) ×2 IMPLANT
CANISTER SUCTION 2500CC (MISCELLANEOUS) ×2 IMPLANT
CHLORAPREP W/TINT 26ML (MISCELLANEOUS) ×2 IMPLANT
CLIP APPLIE ROT 10 11.4 M/L (STAPLE) ×1 IMPLANT
CLOTH BEACON ORANGE TIMEOUT ST (SAFETY) ×2 IMPLANT
COVER MAYO STAND STRL (DRAPES) ×2 IMPLANT
DECANTER SPIKE VIAL GLASS SM (MISCELLANEOUS) ×2 IMPLANT
DRAPE C-ARM 42X72 X-RAY (DRAPES) ×2 IMPLANT
DRAPE LAPAROSCOPIC ABDOMINAL (DRAPES) ×2 IMPLANT
DRAPE UTILITY XL STRL (DRAPES) ×2 IMPLANT
ELECT REM PT RETURN 9FT ADLT (ELECTROSURGICAL) ×2
ELECTRODE REM PT RTRN 9FT ADLT (ELECTROSURGICAL) ×1 IMPLANT
GLOVE BIOGEL PI IND STRL 7.0 (GLOVE) ×1 IMPLANT
GLOVE BIOGEL PI INDICATOR 7.0 (GLOVE) ×1
GLOVE SURG ORTHO 8.0 STRL STRW (GLOVE) ×2 IMPLANT
GOWN STRL NON-REIN LRG LVL3 (GOWN DISPOSABLE) IMPLANT
GOWN STRL REIN XL XLG (GOWN DISPOSABLE) ×6 IMPLANT
HEMOSTAT SURGICEL 4X8 (HEMOSTASIS) IMPLANT
KIT BASIN OR (CUSTOM PROCEDURE TRAY) ×2 IMPLANT
NS IRRIG 1000ML POUR BTL (IV SOLUTION) ×2 IMPLANT
POUCH SPECIMEN RETRIEVAL 10MM (ENDOMECHANICALS) ×2 IMPLANT
SCISSORS LAP 5X35 DISP (ENDOMECHANICALS) IMPLANT
SET CHOLANGIOGRAPH MIX (MISCELLANEOUS) ×2 IMPLANT
SET IRRIG TUBING LAPAROSCOPIC (IRRIGATION / IRRIGATOR) ×2 IMPLANT
SLEEVE XCEL OPT CAN 5 100 (ENDOMECHANICALS) ×2 IMPLANT
SOLUTION ANTI FOG 6CC (MISCELLANEOUS) ×2 IMPLANT
STRIP CLOSURE SKIN 1/2X4 (GAUZE/BANDAGES/DRESSINGS) ×2 IMPLANT
SUT MNCRL AB 4-0 PS2 18 (SUTURE) ×2 IMPLANT
TOWEL OR 17X26 10 PK STRL BLUE (TOWEL DISPOSABLE) ×2 IMPLANT
TRAY LAP CHOLE (CUSTOM PROCEDURE TRAY) ×2 IMPLANT
TROCAR XCEL BLUNT TIP 100MML (ENDOMECHANICALS) ×2 IMPLANT
TROCAR XCEL NON-BLD 11X100MML (ENDOMECHANICALS) ×2 IMPLANT
TUBING INSUFFLATION 10FT LAP (TUBING) ×2 IMPLANT

## 2013-02-25 NOTE — Interval H&P Note (Signed)
History and Physical Interval Note:  02/25/2013 7:20 AM  Rachel Coffey  has presented today for surgery, with the diagnosis of cholelithiasis, chronic cholecystitis.  The various methods of treatment have been discussed with the patient and family. After consideration of risks, benefits and other options for treatment, the patient has consented to    Procedure(s): LAPAROSCOPIC CHOLECYSTECTOMY WITH INTRAOPERATIVE CHOLANGIOGRAM (N/A) as a surgical intervention .    The patient's history has been reviewed, patient examined, no change in status, stable for surgery.  I have reviewed the patient's chart and labs.  Questions were answered to the patient's satisfaction.    Velora Heckler, MD, [facility] Office: [phone]    [name]

## 2013-02-25 NOTE — Op Note (Signed)
Procedure Note  Pre-operative Diagnosis: Calculus of gallbladder with other cholecystitis, without mention of obstruction  Post-operative Diagnosis: Same  Surgeon:  Velora Heckler, MD, FACS  Procedure:  Laparoscopic cholecystectomy with intra-operative cholangiography  Assistant:  none   Anesthesia:  General  Indications: This patient presents with symptomatic gallbladder disease and will undergo laparoscopic cholecystectomy with intraoperative cholangiography.  Procedure Details: The patient was seen in the pre-op holding area. The risks, benefits, complications, treatment options, and expected outcomes have been discussed with the patient. The patient and/or family agreed with the proposed plan and signed the informed consent form.  The patient was taken to Operating Room, identified as Rachel Coffey and the procedure verified as Laparoscopic Cholecystectomy with Intraoperative Cholangiogram. A "time out" was completed and the above information confirmed.  Prior to the induction of general anesthesia, antibiotic prophylaxis was administered. General endotracheal anesthesia was then administered and tolerated well. After the induction, the abdomen was prepped in the usual strict aseptic fashion. The patient was in the supine position.  An incision was made in the skin near the umbilicus. The midline fascia was incised and the peritoneal cavity entered and the Hasson canula was introduced under direct vision. Hasson canula was secured with a pursestring 0-Vicryl suture. Pneumoperitoneum was then established with carbon dioxide and tolerated well without any adverse changes in the patient's vital signs. Additional trocars were introduced under direct vision along the right costal margin in the midline, mid-clavicular line, and anterior axillary line.  The gallbladder was identified and the fundus grasped and retracted cephalad. Adhesions were taken down bluntly and with the electrocautery  as needed, taking care not to injure any adjacent structures. The infundibulum was grasped and retracted laterally, exposing the peritoneum overlying the triangle of Calot. This was incised and structures exposed in a blunt fashion. The cystic duct was clearly identified and bluntly dissected circumferentially and clipped at the neck of the gallbladder.  An incision was made in the cystic duct and the cholangiogram catheter introduced. The catheter was secured using an ligaclip.  Real-time cholangiography was performed using the C-arm.  There was rapid filling of a normal caliber common bile duct.  There was reflux of contrast into the left and right hepatic ductal systems.  There was free flow distally into the duodenum without filling defect or obstruction.  Catheter was removed from the peritoneal cavity.  The cystic duct was then triply ligated with surgical clips and divided. The cystic artery was identified, dissected circumferentially, ligated with ligaclips, and divided.   The gallbladder was dissected from the liver bed with the electrocautery used for hemostasis. The gallbladder was completely removed and placed into an endocatch bag. The right upper quadrant was irrigated and inspected. Hemostasis was achieved with the electrocautery. Warm saline irrigation was utilized and was repeatedly aspirated until clear.  Pneumoperitoneum was released after viewing removal of the trocars with good hemostasis noted. The umbilical wound was irrigated and the fascia was then closed with the pursestring suture.  The skin was then closed with 4-0 Monocril subcuticular sutures and sterile dressings were applied.  Instrument, sponge, and needle counts were correct at the conclusion of the case.  The patient tolerated the procedure well.  Estimated Blood Loss: Minimal         Drains: none         Specimens: Gallbladder to pathology         Disposition: PACU - hemodynamically stable.         Condition:  stable   Velora Heckler, MD, [facility] Office: [phone]

## 2013-02-25 NOTE — Anesthesia Preprocedure Evaluation (Addendum)
Anesthesia Evaluation  Patient identified by MRN, date of birth, ID band Patient awake    Reviewed: Allergy & Precautions, H&P , NPO status , Patient's Chart, lab work & pertinent test results, reviewed documented beta blocker date and time   Airway Mallampati: II TM Distance: >3 FB Neck ROM: full    Dental no notable dental hx.    Pulmonary asthma ,  Rare asthma symptoms. breath sounds clear to auscultation  Pulmonary exam normal       Cardiovascular Exercise Tolerance: Good negative cardio ROS  Rhythm:regular Rate:Normal     Neuro/Psych Multiple Sclerosis. Symptoms are neuropathy of feet (mostly tingling).  Neuromuscular disease negative psych ROS   GI/Hepatic negative GI ROS, Neg liver ROS,   Endo/Other  diabetes, Type 2, Oral Hypoglycemic Agents  Renal/GU negative Renal ROS  negative genitourinary   Musculoskeletal   Abdominal   Peds  Hematology negative hematology ROS (+)   Anesthesia Other Findings   Reproductive/Obstetrics negative OB ROS                          Anesthesia Physical Anesthesia Plan  ASA: III  Anesthesia Plan: General   Post-op Pain Management:    Induction: Intravenous  Airway Management Planned: Oral ETT  Additional Equipment:   Intra-op Plan:   Post-operative Plan: Extubation in OR  Informed Consent: I have reviewed the patients History and Physical, chart, labs and discussed the procedure including the risks, benefits and alternatives for the proposed anesthesia with the patient or authorized representative who has indicated his/her understanding and acceptance.   Dental Advisory Given  Plan Discussed with: CRNA  Anesthesia Plan Comments:         Anesthesia Quick Evaluation

## 2013-02-25 NOTE — Preoperative (Signed)
Beta Blockers   Reason not to administer Beta Blockers:Not Applicable 

## 2013-02-25 NOTE — Transfer of Care (Signed)
Immediate Anesthesia Transfer of Care Note  Patient: Rachel Coffey  Procedure(s) Performed: Procedure(s): LAPAROSCOPIC CHOLECYSTECTOMY WITH INTRAOPERATIVE CHOLANGIOGRAM (N/A)  Patient Location: PACU  Anesthesia Type:General  Level of Consciousness: awake and alert   Airway & Oxygen Therapy: Patient Spontanous Breathing and Patient connected to face mask oxygen  Post-op Assessment: Report given to PACU RN and Post -op Vital signs reviewed and stable  Post vital signs: Reviewed and stable  Complications: No apparent anesthesia complications

## 2013-02-25 NOTE — Anesthesia Postprocedure Evaluation (Signed)
  Anesthesia Post-op Note  Patient: Rachel Coffey  Procedure(s) Performed: Procedure(s) (LRB): LAPAROSCOPIC CHOLECYSTECTOMY WITH INTRAOPERATIVE CHOLANGIOGRAM (N/A)  Patient Location: PACU  Anesthesia Type: General  Level of Consciousness: awake and alert   Airway and Oxygen Therapy: Patient Spontanous Breathing  Post-op Pain: mild  Post-op Assessment: Post-op Vital signs reviewed, Patient's Cardiovascular Status Stable, Respiratory Function Stable, Patent Airway and No signs of Nausea or vomiting  Last Vitals:  Filed Vitals:   02/25/13 1000  BP: 123/63  Pulse:   Temp:   Resp:     Post-op Vital Signs: stable   Complications: No apparent anesthesia complications

## 2013-02-25 NOTE — H&P (View-Only) (Signed)
General Surgery - [facility]  Chief Complaint  Patient presents with  . New Evaluation    eval symptomatic GB - referral by Dr. John Hayes and Dr. Lisa Miller    HISTORY: Patient is a 61-year-old white female referred by her gastroenterologist for consideration for cholecystectomy. Patient has had intermittent episodes of epigastric abdominal pain radiating to the back. These to been associated with nausea, vomiting, and diarrhea. The most severe episode occurred in January 2014. She denies fevers or chills. There is no history of jaundice or acholic stools.  Workup included abdominal ultrasound performed on 11/23/2012. This documented multiple gallstones and probable fatty infiltration of the liver. Laboratory studies show normal liver function test.  Patient has had previous gynecologic surgery. She also has a history of multiple sclerosis and has been on medication. This has somewhat complicated her symptoms.  Past Medical History  Diagnosis Date  . Diabetes mellitus without complication   . Asthma   . Hyperlipidemia   . MS (multiple sclerosis)      Current Outpatient Prescriptions  Medication Sig Dispense Refill  . ALPRAZolam (XANAX) 0.5 MG tablet Take 0.5 mg by mouth at bedtime as needed for sleep.      . ascorbic acid (VITAMIN C) 500 MG tablet Take 500 mg by mouth daily.      . calcium-vitamin D 250-100 MG-UNIT per tablet Take 1 tablet by mouth 2 (two) times daily.      . Choline Fenofibrate (TRILIPIX) 135 MG capsule Take 135 mg by mouth daily.      . cyclobenzaprine (FLEXERIL) 10 MG tablet Take 10 mg by mouth 3 (three) times daily as needed for muscle spasms.      . glipiZIDE (GLUCOTROL XL) 2.5 MG 24 hr tablet Take 2.5 mg by mouth daily.      . lisinopril (PRINIVIL,ZESTRIL) 10 MG tablet Take 10 mg by mouth daily.      . Multiple Vitamins-Minerals (MULTIVITAMIN WITH MINERALS) tablet Take 1 tablet by mouth daily.      . VENTOLIN HFA 108 (90 BASE) MCG/ACT  inhaler INHALE TWO PUFFS FOUR TIMES A DAY AS    NEEDED  1 Inhaler  0   No current facility-administered medications for this visit.     Allergies  Allergen Reactions  . Codeine   . Hydrocodone Itching     Family History  Problem Relation Age of Onset  . COPD Mother   . Heart disease Mother   . Diabetes Father   . Heart disease Sister      History   Social History  . Marital Status: Married    Spouse Name: N/A    Number of Children: N/A  . Years of Education: N/A   Social History Main Topics  . Smoking status: Former Smoker    Types: Cigarettes    Quit date: 01/31/2005  . Smokeless tobacco: Never Used  . Alcohol Use: No  . Drug Use: No  . Sexually Active: None   Other Topics Concern  . None   Social History Narrative  . None     REVIEW OF SYSTEMS - PERTINENT POSITIVES ONLY: Epigastric abdominal pain with radiation to back. Intermittent episodes of nausea and vomiting. Frequent episodes of diarrhea. No history of hepatobiliary or pancreatic disease.  EXAM: Filed Vitals:   02/06/13 0911  BP: 102/60  Pulse: 81  Temp: 98.8 F (37.1 C)  Resp: 18    HEENT: normocephalic; pupils equal and reactive; sclerae clear; dentition good; mucous membranes   moist NECK:  symmetric on extension; no palpable anterior or posterior cervical lymphadenopathy; no supraclavicular masses; no tenderness CHEST: clear to auscultation bilaterally without rales, rhonchi, or wheezes CARDIAC: regular rate and rhythm without significant murmur; peripheral pulses are full ABDOMEN: soft without distension; bowel sounds present; no mass; no hepatosplenomegaly; no hernia; well-healed incisions below umbilicus and in the Pfannenstiel location; mild tenderness in the right upper quadrant and epigastrium EXT:  non-tender without edema; no deformity NEURO: no gross focal deficits; no sign of tremor   LABORATORY RESULTS: See [facility] (CHL-Epic) for most recent results   RADIOLOGY  RESULTS: See [facility] (CHL-Epic) for most recent results   IMPRESSION: #1 symptomatic cholelithiasis, probable chronic cholecystitis #2 personal history of multiple sclerosis  PLAN: I discussed at length with the patient and her husband the indications for surgery. I provided them with written literature to review regarding laparoscopic gallbladder surgery. I have recommended laparoscopic cholecystectomy with intraoperative cholangiography. We discussed risk and benefits of the procedure including the possibility of conversion to open surgery. We have discussed the hospital stay to be anticipated and her recovery. She and her husband understand and wish to proceed with surgery in the near future.  The risks and benefits of the procedure have been discussed at length with the patient.  The patient understands the proposed procedure, potential alternative treatments, and the course of recovery to be expected.  All of the patient's questions have been answered at this time.  The patient wishes to proceed with surgery.  Creek Gan M. Angeni Chaudhuri, MD, FACS General & Endocrine Surgery [facility]   Visit Diagnoses: 1. Cholelithiasis with cholecystitis     Primary Care Physician: MILLER,LISA LYNN, MD   

## 2013-02-26 ENCOUNTER — Encounter (HOSPITAL_COMMUNITY): Payer: Self-pay | Admitting: Surgery

## 2013-02-26 MED ORDER — TRAMADOL HCL 50 MG PO TABS: 50.0000 mg | ORAL_TABLET | Freq: Four times a day (QID) | ORAL | Status: DC | PRN

## 2013-02-26 NOTE — Progress Notes (Signed)
Vital signs are stable, patient is alert and oriented, incisions are within normal limits, patient is tolerating diet, patient to follow up with MD within 2 weeks, Stanford Breed RN 02-26-2013 11:00am

## 2013-02-26 NOTE — Discharge Summary (Signed)
Physician Discharge Summary [facility]  Patient ID: Rachel Coffey MRN: [id number] DOB/AGE: July 28, 1951 62 y.o.  Admit date: 02/25/2013 Discharge date: 02/26/2013  Admission Diagnoses:  Chronic cholecystitis, cholelithiasis  Discharge Diagnoses:  Principal Problem:   Cholelithiasis with cholecystitis   Discharged Condition: good  Hospital Course: patient admitted for observation after lap chole with IOC.  Post op course uneventful.  Tolerating regular diet.  Pain well controlled.  Prepared for discharge on POD#1.  Consults: None  Significant Diagnostic Studies: labs: none  Treatments: surgery: laparoscopic cholecystectomy with IOC  Discharge Exam: Blood pressure 135/68, pulse 77, temperature 97.9 F (36.6 C), temperature source Oral, resp. rate 16, height 5\' 7"  (1.702 m), weight 181 lb (82.101 kg), SpO2 97.00%. HEENT - clear Chest - clear bilat Cor - RRR Abd - soft without distension; dressings dry and intact  Disposition: Home with family  Discharge Orders   Future Appointments Provider Department Dept Phone   03/11/2013 2:00 PM Velora Heckler, MD [facility], [place] [phone]   Future Orders Complete By Expires     Diet - low sodium heart healthy  As directed     Discharge instructions  As directed     Comments:      [facility]  LAPAROSCOPIC SURGERY - POST-OP INSTRUCTIONS  Always review your discharge instruction sheet given to you by the facility where your surgery was performed.  A prescription for pain medication may be given to you upon discharge.  Take your pain medication as prescribed.  If narcotic pain medicine is not needed, then you may take acetaminophen (Tylenol) or ibuprofen (Advil) as needed.  Take your usually prescribed medications unless otherwise directed.  If you need a refill on your pain medication, please contact your pharmacy.  They will contact our office to request authorization.  Prescriptions will not be filled after 5 P.M. or on weekends.  You should follow a light diet the first few days after arrival home, such as soup and crackers or toast.  Be sure to include plenty of fluids daily.  Most patients will experience some swelling and bruising in the area of the incisions.  Ice packs will help.  Swelling and bruising can take several days to resolve.   It is common to experience some constipation if taking pain medication after surgery.  Increasing fluid intake and taking a stool softener (such as Colace) will usually help or prevent this problem from occurring.  A mild laxative (Milk of Magnesia or Miralax) should be taken according to package instructions if there are no bowel movements after 48 hours.  Unless discharge instructions indicate otherwise, you may remove your bandages 24-48 hours after surgery, and you may shower at that time.  You may have steri-strips (small skin tapes) in place directly over the incision.  These strips should be left on the skin for 7-10 days.  If your surgeon used skin glue on the incision, you may shower in 24 hours.  The glue will flake off over the next 2-3 weeks.  Any sutures or staples will be removed at the office during your follow-up visit.  ACTIVITIES:  You may resume regular (light) daily activities beginning the next day-such as daily self-care, walking, climbing stairs-gradually increasing activities as tolerated.  You may have sexual intercourse when it is comfortable.  Refrain from any heavy lifting or straining until approved by your doctor.  You may drive when you are no longer taking prescription pain medication, you can  comfortably wear a seatbelt, and you can safely maneuver your car and apply brakes.  You should see your doctor in the office for a follow-up appointment approximately 2-3 weeks after your surgery.  Make sure that you call for this appointment within a day or two after you arrive home to insure a convenient  appointment time.  WHEN TO CALL YOUR DOCTOR: Fever over 101.0 Inability to urinate Continued bleeding from incision Increased pain, redness, or drainage from the incision Increasing abdominal pain  The clinic staff is available to answer your questions during regular business hours.  Please don't hesitate to call and ask to speak to one of the nurses for clinical concerns.  If you have a medical emergency, go to the nearest emergency room or call 911.  A surgeon from [facility] is always on call for the hospital.  Velora Heckler, MD, [facility] Office: [phone] Toll Free:  [phone] FAX [phone]  Web site: [email or web address]    Increase activity slowly  As directed     Remove dressing in 24 hours  As directed         Medication List    TAKE these medications       albuterol 108 (90 BASE) MCG/ACT inhaler  Commonly known as:  PROVENTIL HFA;VENTOLIN HFA  Inhale 2 puffs into the lungs every 6 (six) hours as needed for wheezing.     ALPRAZolam 0.5 MG tablet  Commonly known as:  XANAX  Take 0.5 mg by mouth daily as needed (for pain).     CALCIUM 600+D 600-400 MG-UNIT per tablet  Generic drug:  Calcium Carbonate-Vitamin D  Take 1 tablet by mouth daily.     calcium-vitamin D 250-100 MG-UNIT per tablet  Take 1 tablet by mouth 2 (two) times daily.     cholecalciferol 1000 UNITS tablet  Commonly known as:  VITAMIN D  Take 500 Units by mouth daily.     Cinnamon 500 MG Tabs  Take 500 mg by mouth daily.     cyclobenzaprine 10 MG tablet  Commonly known as:  FLEXERIL  Take 10 mg by mouth 3 (three) times daily as needed for muscle spasms.     glipiZIDE 2.5 MG 24 hr tablet  Commonly known as:  GLUCOTROL XL  Take 2.5 mg by mouth 2 (two) times daily.     lisinopril 10 MG tablet  Commonly known as:  PRINIVIL,ZESTRIL  Take 10 mg by mouth every morning.     multivitamin with minerals tablet  Take 1 tablet by  mouth daily.     simvastatin 20 MG tablet  Commonly known as:  ZOCOR  Take 20 mg by mouth at bedtime.     traMADol 50 MG tablet  Commonly known as:  ULTRAM  Take 1 tablet (50 mg total) by mouth every 6 (six) hours as needed for pain.     TRILIPIX 135 MG capsule  Generic drug:  Choline Fenofibrate  Take 135 mg by mouth daily.         Velora Heckler, MD, [facility] Office: [phone]   Signed: Velora Heckler 02/26/2013, 10:29 AM

## 2013-02-26 NOTE — Care Management Note (Signed)
    Page 1 of 1   02/26/2013     11:02:02 AM   CARE MANAGEMENT NOTE 02/26/2013  Patient:  Rachel Coffey, Rachel Coffey   Account Number:  [id number]  Date Initiated:  02/26/2013  Documentation initiated by:  Lorenda Ishihara  Subjective/Objective Assessment:   62 yo female admitted s/p lap chole.     Action/Plan:   Home when stable   Anticipated DC Date:  02/26/2013   Anticipated DC Plan:  HOME/SELF CARE      DC Planning Services  CM consult      Choice offered to / List presented to:             Status of service:  Completed, signed off Medicare Important Message given?   (If response is "NO", the following Medicare IM given date fields will be blank) Date Medicare IM given:   Date Additional Medicare IM given:    Discharge Disposition:  HOME/SELF CARE  Per UR Regulation:  Reviewed for med. necessity/level of care/duration of stay  If discussed at Long Length of Stay Meetings, dates discussed:    Comments:

## 2013-03-04 ENCOUNTER — Encounter (INDEPENDENT_AMBULATORY_CARE_PROVIDER_SITE_OTHER): Payer: Self-pay

## 2013-03-11 ENCOUNTER — Encounter (INDEPENDENT_AMBULATORY_CARE_PROVIDER_SITE_OTHER): Payer: Self-pay | Admitting: Surgery

## 2013-03-11 ENCOUNTER — Ambulatory Visit (INDEPENDENT_AMBULATORY_CARE_PROVIDER_SITE_OTHER): Payer: 59 | Admitting: Surgery

## 2013-03-11 VITALS — BP 120/64 | HR 70 | Temp 97.2°F | Resp 16 | Ht 68.0 in | Wt 181.0 lb

## 2013-03-11 DIAGNOSIS — K8010 Calculus of gallbladder with chronic cholecystitis without obstruction: Secondary | ICD-10-CM

## 2013-03-11 NOTE — Progress Notes (Signed)
General Surgery [facility]  Visit Diagnoses: 1. Cholelithiasis with cholecystitis     HISTORY: Patient returns for postoperative visit having undergone laparoscopic cholecystectomy with intraoperative cholangiography on 02/25/2013. Final pathology shows chronic cholecystitis and cholelithiasis. Intraoperative cholangiogram was normal.  EXAM: Abdomen is soft, nontender, without distention. Surgical wounds are well healed. No sign of herniation. No sign of infection. Right upper quadrant is soft and nontender without mass.  IMPRESSION: Status post laparoscopic cholecystectomy with intraoperative cholangiography  PLAN: Patient is released to full activity without restriction. She will begin applying topical creams to her incisions. She will return for surgical care as needed.  Velora Heckler, MD, FACS General & Endocrine Surgery [facility]

## 2013-03-11 NOTE — Patient Instructions (Signed)
  COCOA BUTTER & VITAMIN E CREAM  ([employer] or other brand)  Apply cocoa butter/vitamin E cream to your incision 2 - 3 times daily.  Massage cream into incision for one minute with each application.  Use sunscreen (50 SPF or higher) for first 6 months after surgery if area is exposed to sun.  You may substitute Mederma or other scar reducing creams as desired.   

## 2013-04-24 ENCOUNTER — Other Ambulatory Visit: Payer: Self-pay | Admitting: Gastroenterology

## 2013-05-11 ENCOUNTER — Other Ambulatory Visit: Payer: Self-pay | Admitting: Internal Medicine

## 2014-01-08 ENCOUNTER — Ambulatory Visit (HOSPITAL_COMMUNITY)
Admission: RE | Admit: 2014-01-08 | Discharge: 2014-01-08 | Disposition: A | Discharge status: Home / Self Care | Payer: 59 | Source: Ambulatory Visit | Attending: Family Medicine | Admitting: Family Medicine

## 2014-01-08 DIAGNOSIS — E119 Type 2 diabetes mellitus without complications: Secondary | ICD-10-CM | POA: Insufficient documentation

## 2014-01-08 DIAGNOSIS — J45909 Unspecified asthma, uncomplicated: Secondary | ICD-10-CM | POA: Insufficient documentation

## 2014-01-08 DIAGNOSIS — R29898 Other symptoms and signs involving the musculoskeletal system: Secondary | ICD-10-CM | POA: Insufficient documentation

## 2014-01-08 DIAGNOSIS — M545 Low back pain, unspecified: Secondary | ICD-10-CM | POA: Insufficient documentation

## 2014-01-08 DIAGNOSIS — IMO0001 Reserved for inherently not codable concepts without codable children: Secondary | ICD-10-CM | POA: Insufficient documentation

## 2014-01-08 DIAGNOSIS — E785 Hyperlipidemia, unspecified: Secondary | ICD-10-CM | POA: Insufficient documentation

## 2014-01-08 DIAGNOSIS — G35 Multiple sclerosis: Secondary | ICD-10-CM | POA: Insufficient documentation

## 2014-01-08 NOTE — Evaluation (Signed)
Physical Therapy Evaluation  Patient Details  Name: Rachel Coffey MRN: [id number] Date of Birth: July 21, 1951  Today's Date: 01/08/2014 Time: 1520-1610 PT Time Calculation (min): 50 min Charge:  Evaluation 1520-1600; there ex 1600-1610             Visit#: 2 of 12  Re-eval: 02/07/14 Assessment Diagnosis: lumbar pain  Prior Therapy: none  Authorization: [employer]    Past Medical History:  Past Medical History  Diagnosis Date  . Diabetes mellitus without complication   . Hyperlipidemia   . MS (multiple sclerosis)   . Asthma     stress induced with allergies  . Neuromuscular disorder     dx. "39- MS" -Dr. Barkley Boards, [place](Neuropathy and "band around the chest"   Past Surgical History:  Past Surgical History  Procedure Laterality Date  . Colonoscopy  02-19-13    1 polyp removal in past  . Abdominal hysterectomy      with 1 ovary removed  . Tonsillectomy    . Cholecystectomy N/A 02/25/2013    Procedure: LAPAROSCOPIC CHOLECYSTECTOMY WITH INTRAOPERATIVE CHOLANGIOGRAM;  Surgeon: Earnstine Regal, MD;  Location: [facility];  Service: General;  Laterality: N/A;    Subjective Symptoms/Limitations Symptoms: Ms. Maya states that she has been experiencing pain when she is weight bearing B lumbar area which is progessive in nature..  She has been taking xanax everyday to control her pain. Pertinent History: MS  How long can you sit comfortably?: sitting is no problem for her How long can you stand comfortably?: fifteen minutes How long can you walk comfortably?: fifteen miutes.   Pain Assessment Currently in Pain?: No/denies (pain is with weight bearing 5/10) Pain Location: Back Pain Orientation: Right;Left Pain Type: Chronic pain Pain Onset: More than a month ago Pain Frequency: Intermittent Pain Relieving Factors: lying down Effect of Pain on Daily Activities: increases  Multiple Pain Sites: No  Balance Screening Balance Screen Has the patient fallen in the past 6 months:  No  Prior Functioning  Prior Function Vocation: Retired Leisure: Hobbies-yes (Comment) Comments: read, working out   Sensation/Coordination/Flexibility/Functional Tests Functional Tests Functional Tests: FS 47  Assessment RLE Strength Right Hip Flexion: 5/5 Right Hip Extension: 3/5 Right Hip ABduction:  (4+/5) Right Hip ADduction: 5/5 Right Knee Flexion: 3+/5 Right Knee Extension: 5/5 Right Ankle Dorsiflexion: 5/5 LLE Strength Left Hip Flexion: 5/5 Left Hip Extension: 3/5 Left Hip ABduction: 5/5 Left Hip ADduction: 5/5 Left Knee Flexion: 3+/5 Left Knee Extension: 5/5 Left Ankle Dorsiflexion: 5/5 Lumbar AROM Lumbar Flexion: wfl Lumbar Extension: wfl Lumbar - Right Side Bend: wfl Lumbar - Left Side Bend: wfl with increased pain  Lumbar - Right Rotation: wfl Lumbar - Left Rotation: wfl Palpation Palpation: Pt has tight paraspinal mm B   Exercise/Treatments    Stretches Active Hamstring Stretch: 3 reps;30 seconds Single Knee to Chest Stretch: 3 reps;30 seconds Supine Ab Set: 10 reps Clam: 10 reps Bent Knee Raise: 10 reps    Physical Therapy Assessment and Plan PT Assessment and Plan Clinical Impression Statement: Pt is a 63 yo with MS who has had progressive lumbar pain upon standing.  She has been referred to PT to decrease her sx. Exam demonstrates decresed strength as well as mm spasms in the lumbar area. Pt will benefit from skilled PT to address these issues and improve the pts quality of life.  Pt will benefit from skilled therapeutic intervention in order to improve on the following deficits: Decreased activity tolerance;Pain;Increased muscle spasms;Increased fascial restricitons Rehab Potential: Good PT  Frequency: Min 2X/week PT Duration: 4 weeks PT Treatment/Interventions: Therapeutic exercise;Therapeutic activities;Manual techniques PT Plan: begin manual techniques to decrease spasm and fascial restrictions.     Goals Home Exercise  Program Pt/caregiver will Perform Home Exercise Program: For increased strengthening PT Short Term Goals Time to Complete Short Term Goals: 2 weeks PT Short Term Goal 1: Pt pain to be no greater than 3/10 80% of the day. PT Short Term Goal 2: Pt strength to improve by 1/2 grade PT Short Term Goal 3: Pt to be able to walk for 20 minutes without increased pain in order to complete short shopping sprees PT Short Term Goal 4: Pt to be able to stand for 20 minutes to make a small meal without increased pain. PT Long Term Goals Time to Complete Long Term Goals: 4 weeks PT Long Term Goal 1: Pt pain to be no greater than a 1 80% of the day PT Long Term Goal 2: Pt strength to be improved one grade for the above to occur. Long Term Goal 3: Pt to be able to walk for 40 minutes without having increased pain,. Long Term Goal 4: Pt to be able to stand for 40 miinutes in order to make a normal meal.   PT Long Term Goal 5: Pt to no longer have any mm spasms in lumbar spine.   Problem List Patient Active Problem List   Diagnosis Date Noted  . Weakness of back 01/08/2014  . Back pain, lumbosacral 01/08/2014  . Cholelithiasis with cholecystitis 02/06/2013  . DIABETES MELLITUS, TYPE II 04/02/2010  . HYPERLIPIDEMIA 04/02/2010  . MULTIPLE SCLEROSIS 04/02/2010  . ALLERGIC RHINITIS 04/02/2010  . ASTHMA 04/02/2010  . DIVERTICULITIS, HX OF 04/02/2010    PT Plan of Care PT Home Exercise Plan: given Consulted and Agree with Plan of Care: Patient  GP    Rena Sweeden,CINDY 01/08/2014, 4:43 PM  Physician Documentation Your signature is required to indicate approval of the treatment plan as stated above.  Please sign and either send electronically or make a copy of this report for your files and return this physician signed original.   Please mark one 1.__approve of plan  2. ___approve of plan with the following conditions.   ______________________________                                                           _____________________ Physician Signature                                                                                                             Date

## 2014-01-13 ENCOUNTER — Ambulatory Visit (HOSPITAL_COMMUNITY)
Admission: RE | Admit: 2014-01-13 | Discharge: 2014-01-13 | Disposition: A | Discharge status: Home / Self Care | Payer: 59 | Source: Ambulatory Visit | Attending: Family Medicine | Admitting: Family Medicine

## 2014-01-13 DIAGNOSIS — E119 Type 2 diabetes mellitus without complications: Secondary | ICD-10-CM | POA: Insufficient documentation

## 2014-01-13 DIAGNOSIS — M545 Low back pain, unspecified: Secondary | ICD-10-CM | POA: Insufficient documentation

## 2014-01-13 DIAGNOSIS — G35 Multiple sclerosis: Secondary | ICD-10-CM | POA: Insufficient documentation

## 2014-01-13 DIAGNOSIS — IMO0001 Reserved for inherently not codable concepts without codable children: Secondary | ICD-10-CM | POA: Insufficient documentation

## 2014-01-13 DIAGNOSIS — E785 Hyperlipidemia, unspecified: Secondary | ICD-10-CM | POA: Insufficient documentation

## 2014-01-13 DIAGNOSIS — J45909 Unspecified asthma, uncomplicated: Secondary | ICD-10-CM | POA: Insufficient documentation

## 2014-01-13 NOTE — Progress Notes (Signed)
Physical Therapy Treatment Patient Details  Name: Rachel Coffey MRN: [id number] Date of Birth: 1951-11-04  Today's Date: 01/13/2014 Time: 0932-3557 PT Time Calculation (min): 39 min Charges: Therex x [phone]) Manual x 62'([phone])  Visit#: 3 of 12  Re-eval: 02/07/14 Authorization: [employer]    Subjective: Symptoms/Limitations Symptoms: Pt states that her back feels weak and tired. Pain Assessment Currently in Pain?: Yes Pain Score: 2  (Standing) Pain Location: Back Pain Orientation: Right;Left   Exercise/Treatments Stretches Active Hamstring Stretch: 3 reps;30 seconds Single Knee to Chest Stretch: 3 reps;30 seconds Supine Ab Set: 10 reps Clam: 10 reps Bent Knee Raise: 10 reps Bridge: 10 reps;5 seconds Prone  Other Prone Lumbar Exercises: Manual internal rotation stretch bilateral 3x30  Manual Therapy Manual Therapy: Massage Massage: Along with myofascial release to lumbar musculature to decrease tightness and pain  Physical Therapy Assessment and Plan PT Assessment and Plan Clinical Impression Statement: Pt completes therex well after initial cueing and demo. Decrease bilateral hip internal rotation noted. Tightness noted throughout thoracic and lumbar musculature. Manual techniques completed to decrease tightness and pain. Pt reports decreased stiffness and pain at end of session. Pt will benefit from skilled therapeutic intervention in order to improve on the following deficits: Decreased activity tolerance;Pain;Increased muscle spasms;Increased fascial restricitons Rehab Potential: Good PT Frequency: Min 2X/week PT Duration: 4 weeks PT Treatment/Interventions: Therapeutic exercise;Therapeutic activities;Manual techniques PT Plan: Continue to progress core stability and decrease pain per PT POC. Begin prone exercises next session.     Problem List Patient Active Problem List   Diagnosis Date Noted  . Weakness of back 01/08/2014  . Back pain, lumbosacral  01/08/2014  . Cholelithiasis with cholecystitis 02/06/2013  . DIABETES MELLITUS, TYPE II 04/02/2010  . HYPERLIPIDEMIA 04/02/2010  . MULTIPLE SCLEROSIS 04/02/2010  . ALLERGIC RHINITIS 04/02/2010  . ASTHMA 04/02/2010  . DIVERTICULITIS, HX OF 04/02/2010    PT - End of Session Activity Tolerance: Patient tolerated treatment well General Behavior During Therapy: [facility] for tasks assessed/performed  [name], PTA 01/13/2014, 4:36 PM

## 2014-01-15 ENCOUNTER — Ambulatory Visit (HOSPITAL_COMMUNITY)
Admission: RE | Admit: 2014-01-15 | Discharge: 2014-01-15 | Disposition: A | Discharge status: Home / Self Care | Payer: 59 | Source: Ambulatory Visit | Attending: Family Medicine | Admitting: Family Medicine

## 2014-01-15 DIAGNOSIS — M545 Low back pain, unspecified: Secondary | ICD-10-CM

## 2014-01-15 DIAGNOSIS — R29898 Other symptoms and signs involving the musculoskeletal system: Secondary | ICD-10-CM

## 2014-01-15 NOTE — Progress Notes (Signed)
Physical Therapy Treatment Patient Details  Name: Rachel Coffey MRN: [id number] Date of Birth: 1951-04-20  Today's Date: 01/15/2014 Time: 1520-1600 PT Time Calculation (min): 40 min Charge:  The ex 24; manual 16 Visit#: 4 of 12  Re-eval: 02/07/14    Authorization: [employer]  Authorization Time Period:    Authorization Visit#:   of     Subjective: Symptoms/Limitations Symptoms: Pt states that she spent five hours working in the garage secondary to her hot water heater leaking Pain Assessment Pain Score: 4  Pain Location: Back Pain Orientation: Right;Left      Exercise/Treatments  Stretches Active Hamstring Stretch: 3 reps;30 seconds Single Knee to Chest Stretch: 3 reps;30 seconds Lower Trunk Rotation: 5 reps Pelvic Tilt: 5 reps Standing Scapular Retraction: 10 reps;Theraband Theraband Level (Scapular Retraction): Level 3 (Green) Row: 10 reps;Theraband Theraband Level (Row): Level 3 (Green) Shoulder Extension: 10 reps;Theraband Theraband Level (Shoulder Extension): Level 3 (Green) Single Arm Raise: 10 reps Straight Leg Raise: 10 reps Opposite Arm/Leg Raise: 10 reps Other Prone Lumbar Exercises: rows and B shoulder extension x 10     Manual Therapy Manual Therapy: Myofascial release Massage: along with myofascial release and grade II/III mobs to improve motion.  Physical Therapy Assessment and Plan PT Assessment and Plan Clinical Impression Statement: Pt has very tight paaraspinal mm decreased but not obliterated with manual techniques..  Instructed pt in t-band and prone exercises today. PT Plan: Continue stabiliy and stretching exercises.     Problem List Patient Active Problem List   Diagnosis Date Noted  . Weakness of back 01/08/2014  . Back pain, lumbosacral 01/08/2014  . Cholelithiasis with cholecystitis 02/06/2013  . DIABETES MELLITUS, TYPE II 04/02/2010  . HYPERLIPIDEMIA 04/02/2010  . MULTIPLE SCLEROSIS 04/02/2010  . ALLERGIC RHINITIS 04/02/2010  .  ASTHMA 04/02/2010  . DIVERTICULITIS, HX OF 04/02/2010       GP    RUSSELL,CINDY 01/15/2014, 5:04 PM

## 2014-01-16 ENCOUNTER — Ambulatory Visit (HOSPITAL_COMMUNITY): Payer: 59 | Admitting: Physical Therapy

## 2014-01-21 ENCOUNTER — Ambulatory Visit (HOSPITAL_COMMUNITY)
Admission: RE | Admit: 2014-01-21 | Discharge: 2014-01-21 | Disposition: A | Discharge status: Home / Self Care | Payer: 59 | Source: Ambulatory Visit | Attending: Family Medicine | Admitting: Family Medicine

## 2014-01-21 NOTE — Progress Notes (Addendum)
Physical Therapy Treatment Patient Details  Name: Rachel Coffey MRN: [id number] Date of Birth: Jan 03, 1951  Today's Date: 01/21/2014 Time: 4920-1007 PT Time Calculation (min): 41 min Charges: Therex x 38'  Visit#: 5 of 12  Re-eval: 02/07/14  Authorization: [employer]   Subjective: Symptoms/Limitations Symptoms: Pt states that she has been working hard on stretching her back at home. Pain Assessment Currently in Pain?: Yes Pain Score: 2  Pain Location: Back Pain Orientation: Right;Left   Exercise/Treatments Stretches Single Knee to Chest Stretch: 2 reps;30 seconds Standing Scapular Retraction: 10 reps;Theraband Theraband Level (Scapular Retraction): Level 3 (Green) Row: 10 reps;Theraband Theraband Level (Row): Level 3 (Green) Shoulder Extension: 10 reps;Theraband Theraband Level (Shoulder Extension): Level 3 (Green) Supine Large Ball Abdominal Isometric: 10 reps;5 seconds Large Ball Oblique Isometric: 10 reps;5 seconds Prone  Single Arm Raise: 10 reps Straight Leg Raise: 10 reps Opposite Arm/Leg Raise: 10 reps Multifidus contraction/relax x 10  Physical Therapy Assessment and Plan PT Assessment and Plan Clinical Impression Statement: Pt completes therex well after initial cueing and demo. Progress abdominal strengthening with minimal difficulty. Began multifidus contractions with focus on relaxing after contraction. Pt is without complaint of increased pain throughout session. PT Plan: Continue to progress core stability and lumbar/lower extremity flexibility.     Problem List Patient Active Problem List   Diagnosis Date Noted  . Weakness of back 01/08/2014  . Back pain, lumbosacral 01/08/2014  . Cholelithiasis with cholecystitis 02/06/2013  . DIABETES MELLITUS, TYPE II 04/02/2010  . HYPERLIPIDEMIA 04/02/2010  . MULTIPLE SCLEROSIS 04/02/2010  . ALLERGIC RHINITIS 04/02/2010  . ASTHMA 04/02/2010  . DIVERTICULITIS, HX OF 04/02/2010    PT - End of Session Activity  Tolerance: Patient tolerated treatment well General Behavior During Therapy: [facility] for tasks assessed/performed  [name], PTA  01/21/2014, 5:29 PM

## 2014-01-23 ENCOUNTER — Ambulatory Visit (HOSPITAL_COMMUNITY)
Admission: RE | Admit: 2014-01-23 | Discharge: 2014-01-23 | Disposition: A | Discharge status: Home / Self Care | Payer: 59 | Source: Ambulatory Visit | Attending: Family Medicine | Admitting: Family Medicine

## 2014-01-23 NOTE — Progress Notes (Signed)
Physical Therapy Treatment Patient Details  Name: Rachel Coffey MRN: [id number] Date of Birth: January 10, 1951  Today's Date: 01/23/2014 Time: 6834-1962 PT Time Calculation (min): 39 min Charges: Therex x 38'  Visit#: 6 of 12  Re-eval: 02/07/14  Authorization: [employer]    Subjective: Symptoms/Limitations Symptoms: Pt is currently pain free in lower back. Pt reports increased cervical pain. Pain Assessment Currently in Pain?: Yes Pain Score: 4  Pain Location: Neck Pain Orientation: Right   Exercise/Treatments Stretches Double Knee to Chest Stretch: 1 rep;30 seconds Lower Trunk Rotation: 5 reps;10 seconds Piriformis Stretch: 2 reps;30 seconds Standing Scapular Retraction:  (Held due to cervical pain) Row: 10 reps;Theraband Theraband Level (Row): Level 3 (Green) Shoulder Extension: 10 reps;Theraband Theraband Level (Shoulder Extension): Level 3 (Green) Supine Large Ball Abdominal Isometric: 10 reps;5 seconds Large Ball Oblique Isometric: 10 reps;5 seconds Prone  Straight Leg Raise: 10 reps Opposite Arm/Leg Raise: 10 reps Other Prone Lumbar Exercises: Multifidus contraction/relax x 10 Quadruped Madcat/Old Horse: 5 reps Other Quadruped Lumbar Exercises: Child's pose stretch 3x15"   Physical Therapy Assessment and Plan PT Assessment and Plan Clinical Impression Statement: Pt completes therex well after initial cueing and demo. Progressed to figure four piriformis and child's pose stretch to improve mobility. Decreased tightness noted in lumbar musculature this session. Pt is to attempt standing to make a meal over the week and assess pain. Pt has met or is progressing toward all goals.  PT Plan: Continue to progress core stability and lumbar/lower extremity flexibility. Begin quadruped arm/leg raises.     Goals PT Short Term Goals PT Short Term Goal 1: Pt pain to be no greater than 3/10 80% of the day. PT Short Term Goal 1 - Progress: Met PT Short Term Goal 2: Pt strength  to improve by 1/2 grade PT Short Term Goal 2 - Progress: Progressing toward goal PT Short Term Goal 3: Pt to be able to walk for 20 minutes without increased pain in order to complete short shopping sprees PT Short Term Goal 3 - Progress: Met PT Short Term Goal 4: Pt to be able to stand for 20 minutes to make a small meal without increased pain. PT Short Term Goal 4 - Progress: Progressing toward goal PT Long Term Goals Time to Complete Long Term Goals: 4 weeks PT Long Term Goal 1: Pt pain to be no greater than a 1 80% of the day PT Long Term Goal 1 - Progress: Progressing toward goal PT Long Term Goal 2: Pt strength to be improved one grade for the above to occur. PT Long Term Goal 2 - Progress: Progressing toward goal Long Term Goal 3: Pt to be able to walk for 40 minutes without having increased pain,. Long Term Goal 3 Progress: Met Long Term Goal 4: Pt to be able to stand for 40 miinutes in order to make a normal meal.   Long Term Goal 4 Progress: Progressing toward goal PT Long Term Goal 5: Pt to no longer have any mm spasms in lumbar spine.  Long Term Goal 5 Progress: Progressing toward goal  Problem List Patient Active Problem List   Diagnosis Date Noted  . Weakness of back 01/08/2014  . Back pain, lumbosacral 01/08/2014  . Cholelithiasis with cholecystitis 02/06/2013  . DIABETES MELLITUS, TYPE II 04/02/2010  . HYPERLIPIDEMIA 04/02/2010  . MULTIPLE SCLEROSIS 04/02/2010  . ALLERGIC RHINITIS 04/02/2010  . ASTHMA 04/02/2010  . DIVERTICULITIS, HX OF 04/02/2010    PT - End of Session Activity Tolerance: Patient tolerated  treatment well General Behavior During Therapy: [facility] for tasks assessed/performed  [name], PTA  01/23/2014, 4:02 PM

## 2014-01-27 ENCOUNTER — Ambulatory Visit (HOSPITAL_COMMUNITY)
Admission: RE | Admit: 2014-01-27 | Discharge: 2014-01-27 | Disposition: A | Discharge status: Home / Self Care | Payer: 59 | Source: Ambulatory Visit | Attending: Family Medicine | Admitting: Family Medicine

## 2014-01-27 NOTE — Progress Notes (Signed)
Physical Therapy Treatment Patient Details  Name: Rachel Coffey MRN: [id number] Date of Birth: 1951/05/28  Today's Date: 01/27/2014 Time: 1510-1558 PT Time Calculation (min): 48 min Charges: Therex x 43'  Visit#: 7 of 12  Re-eval: 02/07/14  Authorization: [employer]    Subjective: Symptoms/Limitations Symptoms: Pt reports continued HEP compliance. Pain Assessment Currently in Pain?: Yes Pain Score: 1  Pain Location: Back Pain Orientation: Right   Exercise/Treatments Stretches Piriformis Stretch: 2 reps;30 seconds Seated Hip Flexion on Ball: 10 reps Other Seated Lumbar Exercises: Anterior/posterior right/left pelvic tilts on physiobal x 15 each  Supine Bridge: 10 reps;Limitations Bridge Limitations: With BLE on physiobal  Large Ball Abdominal Isometric: 10 reps;5 seconds Large Ball Oblique Isometric: 10 reps;5 seconds Other Supine Lumbar Exercises: Trunk rotation with feet on physioball x 10  Prone  Other Prone Lumbar Exercises: Multifidus contraction/relax x 10 Other Prone Lumbar Exercises: POE x 3' for lumbar extensor relaxation  Quadruped Madcat/Old Horse: 10 reps Single Arm Raise: 10 reps Straight Leg Raise: 5 reps;Limitations Straight Leg Raises Limitations: Unable to tolerate Other Quadruped Lumbar Exercises: Child's pose stretch 3x15"   Physical Therapy Assessment and Plan PT Assessment and Plan Clinical Impression Statement: Progressed activates with physioball to improve core/postural stability. Pt completes therex well after initial cueing and demo. Pt requires multimodal cueing to completed relax lower lumbar musculature. Pt appears to be progressing well overall. HEP given for physioball activities. PT Plan: Continue to progress core stability and lumbar/lower extremity flexibility.     Problem List Patient Active Problem List   Diagnosis Date Noted  . Weakness of back 01/08/2014  . Back pain, lumbosacral 01/08/2014  . Cholelithiasis with cholecystitis  02/06/2013  . DIABETES MELLITUS, TYPE II 04/02/2010  . HYPERLIPIDEMIA 04/02/2010  . MULTIPLE SCLEROSIS 04/02/2010  . ALLERGIC RHINITIS 04/02/2010  . ASTHMA 04/02/2010  . DIVERTICULITIS, HX OF 04/02/2010    PT - End of Session Activity Tolerance: Patient tolerated treatment well General Behavior During Therapy: [facility] for tasks assessed/performed  [name], PTA 01/27/2014, 4:22 PM

## 2014-01-29 ENCOUNTER — Ambulatory Visit (HOSPITAL_COMMUNITY)
Admission: RE | Admit: 2014-01-29 | Discharge: 2014-01-29 | Disposition: A | Discharge status: Home / Self Care | Payer: 59 | Source: Ambulatory Visit | Attending: Family Medicine | Admitting: Family Medicine

## 2014-01-29 NOTE — Evaluation (Signed)
Physical Therapy Discharge Summary  Patient Details  Name: Rachel Coffey MRN: [id number] Date of Birth: Oct 11, 1951  Today's Date: 01/29/2014 Time: 3614-4315 PT Time Calculation (min): 30 min Charges: MMT x 1 [phone]) Self [phone])              Visit#: 8 of 12  Re-eval: 02/07/14 Assessment Diagnosis: lumbar pain   Authorization: [facility]      Past Medical History:  Past Medical History  Diagnosis Date  . Diabetes mellitus without complication   . Hyperlipidemia   . MS (multiple sclerosis)   . Asthma     stress induced with allergies  . Neuromuscular disorder     dx. "2- MS" -Dr. Barkley Boards, [place](Neuropathy and "band around the chest"   Past Surgical History:  Past Surgical History  Procedure Laterality Date  . Colonoscopy  02-19-13    1 polyp removal in past  . Abdominal hysterectomy      with 1 ovary removed  . Tonsillectomy    . Cholecystectomy N/A 02/25/2013    Procedure: LAPAROSCOPIC CHOLECYSTECTOMY WITH INTRAOPERATIVE CHOLANGIOGRAM;  Surgeon: Earnstine Regal, MD;  Location: [facility];  Service: General;  Laterality: N/A;    Subjective Symptoms/Limitations Symptoms: Pt states that she feels ready for discharge. Pain Assessment Currently in Pain?: No/denies  Precautions/Restrictions       Assessment RLE Strength Right Hip Flexion: 5/5 (Last measured 01/08/14: 5/5) Right Hip Extension: 5/5 (Last measured 01/08/14: 3/5) Right Hip ABduction: 5/5 (Last measured 01/08/14: 4+/5) Right Hip ADduction: 5/5 (Last measured 01/08/14: 5/5) Right Knee Flexion: 5/5 (Last measured 01/08/14: 3+/5) Right Knee Extension: 5/5 (Last measured 01/08/14: 5/5) Right Ankle Dorsiflexion: 5/5 (Last measured 01/08/14: 5/5) LLE Strength Left Hip Flexion: 5/5 (Last measured 01/08/14: 5/5) Left Hip Extension: 5/5 (Last measured 01/08/14: 3/5) Left Hip ABduction: 5/5 (Last measured 01/08/14: 5/5) Left Hip ADduction: 5/5 (Last measured 01/08/14: 5/5) Left Knee Flexion: 5/5 (Last measured  01/08/14: 3+/5) Left Knee Extension: 5/5 (Last measured 01/08/14: 5/5) Left Ankle Dorsiflexion: 5/5 (Last measured 01/08/14: 5/5) Lumbar AROM Lumbar Flexion: wfl (Last measured 01/08/14: wfl) Lumbar Extension: wfl (Last measured 01/08/14: wfl) Lumbar - Right Side Bend: wfl (Last measured 01/08/14: wfl) Lumbar - Left Side Bend: wfl (Last measured 01/08/14: wfl with increased pain) Lumbar - Right Rotation: wfl (Last measured 01/08/14: wfl) Lumbar - Left Rotation: wfl (Last measured 01/08/14: wfl)  Physical Therapy Assessment and Plan PT Assessment and Plan Clinical Impression Statement:  Has progressed well with therapy. Pt displays significant improvement in LE strength and decrease tightness/spams in lumbar musculature. Pt is independent with HEP. Pt has met all goals except long term goals 1 and 5 which she is progressing toward. Pt is comfortable with D/C to HEP.  PT Plan: Recommend D/C to HEP.    Goals PT Short Term Goals PT Short Term Goal 1: Pt pain to be no greater than 3/10 80% of the day. PT Short Term Goal 1 - Progress: Met PT Short Term Goal 2: Pt strength to improve by 1/2 grade PT Short Term Goal 2 - Progress: Met PT Short Term Goal 3: Pt to be able to walk for 20 minutes without increased pain in order to complete short shopping sprees PT Short Term Goal 3 - Progress: Met PT Short Term Goal 4: Pt to be able to stand for 20 minutes to make a small meal without increased pain. PT Short Term Goal 4 - Progress: Met PT Long Term Goals Time to Complete Long Term Goals: 4 weeks PT  Long Term Goal 1: Pt pain to be no greater than a 1 80% of the day PT Long Term Goal 1 - Progress: Progressing toward goal PT Long Term Goal 2: Pt strength to be improved one grade for the above to occur. PT Long Term Goal 2 - Progress: Met Long Term Goal 3: Pt to be able to walk for 40 minutes without having increased pain,. Long Term Goal 3 Progress: Met Long Term Goal 4: Pt to be able to stand for 40  minutes in order to make a normal meal.   Long Term Goal 4 Progress: Met PT Long Term Goal 5: Pt to no longer have any mm spasms in lumbar spine.  Long Term Goal 5 Progress: Progressing toward goal  Problem List Patient Active Problem List   Diagnosis Date Noted  . Weakness of back 01/08/2014  . Back pain, lumbosacral 01/08/2014  . Cholelithiasis with cholecystitis 02/06/2013  . DIABETES MELLITUS, TYPE II 04/02/2010  . HYPERLIPIDEMIA 04/02/2010  . MULTIPLE SCLEROSIS 04/02/2010  . ALLERGIC RHINITIS 04/02/2010  . ASTHMA 04/02/2010  . DIVERTICULITIS, HX OF 04/02/2010    PT - End of Session Activity Tolerance: Patient tolerated treatment well General Behavior During Therapy: [facility] for tasks assessed/performed  [name], PTA  01/29/2014, 5:07 PM  Physician Documentation Your signature is required to indicate approval of the treatment plan as stated above.  Please sign and either send electronically or make a copy of this report for your files and return this physician signed original.   Please mark one 1.__approve of plan  2. ___approve of plan with the following conditions.   ______________________________                                                          _____________________ Physician Signature                                                                                                             Date

## 2014-02-03 ENCOUNTER — Ambulatory Visit (HOSPITAL_COMMUNITY): Payer: 59 | Admitting: Physical Therapy

## 2014-02-05 ENCOUNTER — Ambulatory Visit (HOSPITAL_COMMUNITY): Payer: 59 | Admitting: Physical Therapy

## 2014-04-03 NOTE — Addendum Note (Signed)
Encounter addended by: Debby Bud, OT on: 04/03/2014 12:55 PM<BR>     Documentation filed: Episodes

## 2014-07-25 ENCOUNTER — Encounter: Payer: Self-pay | Admitting: Obstetrics and Gynecology

## 2014-07-25 ENCOUNTER — Ambulatory Visit (INDEPENDENT_AMBULATORY_CARE_PROVIDER_SITE_OTHER): Payer: 59 | Admitting: Obstetrics and Gynecology

## 2014-07-25 VITALS — BP 132/70 | HR 76 | Resp 14 | Ht 67.0 in | Wt 187.0 lb

## 2014-07-25 DIAGNOSIS — N816 Rectocele: Secondary | ICD-10-CM

## 2014-07-25 DIAGNOSIS — N952 Postmenopausal atrophic vaginitis: Secondary | ICD-10-CM

## 2014-07-25 NOTE — Patient Instructions (Signed)
Atrophic Vaginitis Atrophic vaginitis is a problem of low levels of estrogen in women. This problem can happen at any age. It is most common in women who have gone through menopause ("the change").  HOW WILL I KNOW IF I HAVE THIS PROBLEM? You may have:  Trouble with peeing (urinating), such as:  Going to the bathroom often.  A hard time holding your pee until you reach a bathroom.  Leaking pee.  Having pain when you pee.  Itching or a burning feeling.  Vaginal bleeding and spotting.  Pain during sex.  Dryness of the vagina.  A yellow, bad-smelling fluid (discharge) coming from the vagina. HOW WILL MY DOCTOR CHECK FOR THIS PROBLEM?  During your exam, your doctor will likely find the problem.  If there is a vaginal fluid, it may be checked for infection. HOW WILL THIS PROBLEM BE TREATED? Keep the vulvar skin as clean as possible. Moisturizers and lubricants can help with some of the symptoms. Estrogen replacement can help. There are 2 ways to take estrogen:  Systemic estrogen gets estrogen to your whole body. It takes many weeks or months before the symptoms get better.  You take an estrogen pill.  You use a skin patch. This is a patch that you put on your skin.  If you still have your uterus, your doctor may ask you to take a hormone. Talk to your doctor about the right medicine for you.  Estrogen cream.  This puts estrogen only at the part of your body where you apply it. The cream is put into the vagina or put on the vulvar skin. For some women, estrogen cream works faster than pills or the patch. CAN ALL WOMEN WITH THIS PROBLEM USE ESTROGEN? No. Women with certain types of cancer, liver problems, or problems with blood clots should not take estrogen. Your doctor can help you decide the best treatment for your symptoms. Document Released: 04/18/2008 Document Revised: 11/05/2013 Document Reviewed: 04/18/2008 [facility] Patient Information 2015 [place], [place]. This  information is not intended to replace advice given to you by your health care provider. Make sure you discuss any questions you have with your health care provider.  Vaginitis Vaginitis is an inflammation of the vagina. It is most often caused by a change in the normal balance of the bacteria and yeast that live in the vagina. This change in balance causes an overgrowth of certain bacteria or yeast, which causes the inflammation. There are different types of vaginitis, but the most common types are:  Bacterial vaginosis.  Yeast infection (candidiasis).  Trichomoniasis vaginitis. This is a sexually transmitted infection (STI).  Viral vaginitis.  Atropic vaginitis.  Allergic vaginitis. CAUSES  The cause depends on the type of vaginitis. Vaginitis can be caused by:  Bacteria (bacterial vaginosis).  Yeast (yeast infection).  A parasite (trichomoniasis vaginitis)  A virus (viral vaginitis).  Low hormone levels (atrophic vaginitis). Low hormone levels can occur during pregnancy, breastfeeding, or after menopause.  Irritants, such as bubble baths, scented tampons, and feminine sprays (allergic vaginitis). Other factors can change the normal balance of the yeast and bacteria that live in the vagina. These include:  Antibiotic medicines.  Poor hygiene.  Diaphragms, vaginal sponges, spermicides, birth control pills, and intrauterine devices (IUD).  Sexual intercourse.  Infection.  Uncontrolled diabetes.  A weakened immune system. SYMPTOMS  Symptoms can vary depending on the cause of the vaginitis. Common symptoms include:  Abnormal vaginal discharge.  The discharge is white, gray, or yellow with bacterial vaginosis.  The discharge is thick, white, and cheesy with a yeast infection.  The discharge is frothy and yellow or greenish with trichomoniasis.  A bad vaginal odor.  The odor is fishy with bacterial vaginosis.  Vaginal itching, pain, or swelling.  Painful  intercourse.  Pain or burning when urinating. Sometimes, there are no symptoms. TREATMENT  Treatment will vary depending on the type of infection.   Bacterial vaginosis and trichomoniasis are often treated with antibiotic creams or pills.  Yeast infections are often treated with antifungal medicines, such as vaginal creams or suppositories.  Viral vaginitis has no cure, but symptoms can be treated with medicines that relieve discomfort. Your sexual partner should be treated as well.  Atrophic vaginitis may be treated with an estrogen cream, pill, suppository, or vaginal ring. If vaginal dryness occurs, lubricants and moisturizing creams may help. You may be told to avoid scented soaps, sprays, or douches.  Allergic vaginitis treatment involves quitting the use of the product that is causing the problem. Vaginal creams can be used to treat the symptoms. HOME CARE INSTRUCTIONS   Take all medicines as directed by your caregiver.  Keep your genital area clean and dry. Avoid soap and only rinse the area with water.  Avoid douching. It can remove the healthy bacteria in the vagina.  Do not use tampons or have sexual intercourse until your vaginitis has been treated. Use sanitary pads while you have vaginitis.  Wipe from front to back. This avoids the spread of bacteria from the rectum to the vagina.  Let air reach your genital area.  Wear cotton underwear to decrease moisture buildup.  Avoid wearing underwear while you sleep until your vaginitis is gone.  Avoid tight pants and underwear or nylons without a cotton panel.  Take off wet clothing (especially bathing suits) as soon as possible.  Use mild, non-scented products. Avoid using irritants, such as:  Scented feminine sprays.  Fabric softeners.  Scented detergents.  Scented tampons.  Scented soaps or bubble baths.  Practice safe sex and use condoms. Condoms may prevent the spread of trichomoniasis and viral  vaginitis. SEEK MEDICAL CARE IF:   You have abdominal pain.  You have a fever or persistent symptoms for more than 2-3 days.  You have a fever and your symptoms suddenly get worse. Document Released: 08/28/2007 Document Revised: 07/25/2012 Document Reviewed: 04/12/2012 [facility] Patient Information 2015 [place], [place]. This information is not intended to replace advice given to you by your health care provider. Make sure you discuss any questions you have with your health care provider.

## 2014-07-25 NOTE — Progress Notes (Signed)
Patient ID: Rachel Coffey, female   DOB: January 28, 1951, 63 y.o.   MRN: [id number] GYNECOLOGY VISIT  PCP:  Jefferson Fuel  Referring provider:  Kathyrn Lass, MD  HPI: 63 y.o.   Married  Caucasian  female   [phone] with No LMP recorded. Patient has had a hysterectomy.   here for   Evaluation of green vaginal discharge and possible rectocele.  Vaginal hysterectomy in her 45s for prolapse and probable LSO later for an ovarian cyst.  (Not completely certain of unilaterality.) Rectocele present for 4 years.  Feels with wiping.  Does rare manipulation to have bowel movements.  Has rectal urgency for bowel movements.  This is normal for patient.  Takes Fibercon.   Uses Imodium every three days.  Some rectal itching.  Had green vaginal discharge since July 2015. Notes more in the am upon waking.  No iching, burning, or odor.  Treated with Monistat. Treated for BV by PCP and was treated with Flagyl. Also had treatment for yeast infection and was treated for yeast vaginitis.  Saw GYN at [facility] and had suggestion for treatment for estrogen for atrophic symptoms when she had negative vaginitis panel.  Patient has declined this.  Not vaginal internal products or douching.  Not having intercourse.   Patient has concerns about her multiple sclerosis.  Saw Dr. Matilde Sprang for evaluation.  He suggested Macrodantin.  Took for a short period of time. Had diarrhea following that.  Diarrhea preceded this due to a medication change for her MS.  Hgb:   Urine:    GYNECOLOGIC HISTORY: No LMP recorded. Patient has had a hysterectomy. Sexually active:  no Partner preference: female Contraception:  hysterectomy  Menopausal hormone therapy: no DES exposure:  no  Blood transfusions:  no  Sexually transmitted diseases:  Hx of genital warts years ago.   GYN procedures and prior surgeries:  TVH --still has one ovary(pt thinks she has right ovary left) Last mammogram: 11/2013 UMP:NTIRW                Last  pap and high risk HPV testing:  Years ERX:VQMGQQ  History of abnormal pap smear:  no   OB History   Grav Para Term Preterm Abortions TAB SAB Ect Mult Living   3 2 2  1  1   2        LIFESTYLE: Exercise:   no          Tobacco:   Former smoker Alcohol:     no Drug use:  no  Patient Active Problem List   Diagnosis Date Noted  . Weakness of back 01/08/2014  . Back pain, lumbosacral 01/08/2014  . Cholelithiasis with cholecystitis 02/06/2013  . DIABETES MELLITUS, TYPE II 04/02/2010  . HYPERLIPIDEMIA 04/02/2010  . MULTIPLE SCLEROSIS 04/02/2010  . ALLERGIC RHINITIS 04/02/2010  . ASTHMA 04/02/2010  . DIVERTICULITIS, HX OF 04/02/2010    Past Medical History  Diagnosis Date  . Diabetes mellitus without complication   . Hyperlipidemia   . MS (multiple sclerosis)   . Asthma     stress induced with allergies  . Neuromuscular disorder     dx. "18- MS" -Dr. Barkley Boards, [place] (Neuropathy and "band around the chest"  . Genital warts   . STD (sexually transmitted disease)     genital warts--years ago    Past Surgical History  Procedure Laterality Date  . Colonoscopy  02-19-13    1 polyp removal in past  . Tonsillectomy    . Cholecystectomy N/A 02/25/2013  Procedure: LAPAROSCOPIC CHOLECYSTECTOMY WITH INTRAOPERATIVE CHOLANGIOGRAM;  Surgeon: Earnstine Regal, MD;  Location: [facility];  Service: General;  Laterality: N/A;  . Abdominal hysterectomy      with 1 ovary removed-TVH    Current Outpatient Prescriptions  Medication Sig Dispense Refill  . albuterol (PROVENTIL HFA;VENTOLIN HFA) 108 (90 BASE) MCG/ACT inhaler Inhale 2 puffs into the lungs every 6 (six) hours as needed for wheezing.      Marland Kitchen ALPRAZolam (XANAX) 0.5 MG tablet Take 0.5 mg by mouth daily as needed (for pain).       . Calcium Carbonate-Vitamin D (CALCIUM 600+D) 600-400 MG-UNIT per tablet Take 1 tablet by mouth daily.      . calcium-vitamin D 250-100 MG-UNIT per tablet Take 1 tablet by mouth 2 (two) times daily.      .  cholecalciferol (VITAMIN D) 1000 UNITS tablet Take 500 Units by mouth daily.      . Choline Fenofibrate (TRILIPIX) 135 MG capsule Take 135 mg by mouth daily.      . Cinnamon 500 MG TABS Take 500 mg by mouth daily.      . cyclobenzaprine (FLEXERIL) 10 MG tablet Take 10 mg by mouth 3 (three) times daily as needed for muscle spasms.      Marland Kitchen glipiZIDE (GLUCOTROL XL) 2.5 MG 24 hr tablet Take 2.5 mg by mouth 2 (two) times daily.       Marland Kitchen lisinopril (PRINIVIL,ZESTRIL) 10 MG tablet Take 10 mg by mouth every morning.       . Multiple Vitamins-Minerals (MULTIVITAMIN WITH MINERALS) tablet Take 1 tablet by mouth daily.      . simvastatin (ZOCOR) 20 MG tablet Take 20 mg by mouth at bedtime.      . traMADol (ULTRAM) 50 MG tablet Take 1 tablet (50 mg total) by mouth every 6 (six) hours as needed for pain.  20 tablet  1   No current facility-administered medications for this visit.     ALLERGIES: Codeine; Fenofibrate; and Hydrocodone  Family History  Problem Relation Age of Onset  . COPD Mother   . Heart disease Mother   . Diabetes Father   . Heart disease Sister     History   Social History  . Marital Status: Married    Spouse Name: N/A    Number of Children: N/A  . Years of Education: N/A   Occupational History  . Not on file.   Social History Main Topics  . Smoking status: Former Smoker    Types: Cigarettes    Quit date: 01/31/2005  . Smokeless tobacco: Never Used  . Alcohol Use: No  . Drug Use: No  . Sexual Activity: No     Comment: TVH--still has 1 ovary   Other Topics Concern  . Not on file   Social History Narrative  . No narrative on file    ROS:  Pertinent items are noted in HPI.  PHYSICAL EXAMINATION:    BP 132/70  Pulse 76  Resp 14  Ht 5\' 7"  (1.702 m)  Wt 187 lb (84.823 kg)  BMI 29.28 kg/m2   Wt Readings from Last 3 Encounters:  07/25/14 187 lb (84.823 kg)  03/11/13 181 lb (82.101 kg)  02/25/13 181 lb (82.101 kg)     Ht Readings from Last 3 Encounters:   07/25/14 5\' 7"  (1.702 m)  03/11/13 5\' 8"  (1.727 m)  02/25/13 5\' 7"  (1.702 m)    General appearance: alert, cooperative and appears stated age Head: Normocephalic, without obvious abnormality,  atraumatic Neck: no adenopathy, supple, symmetrical, trachea midline and thyroid not enlarged, symmetric, no tenderness/mass/nodules Lungs: clear to auscultation bilaterally Abdomen: soft, non-tender; no masses,  no organomegaly Extremities: extremities normal, atraumatic, no cyanosis or edema Skin: Skin color, texture, turgor normal. No rashes or lesions Lymph nodes: Cervical, supraclavicular, and axillary nodes normal. No abnormal inguinal nodes palpated Neurologic: Grossly normal  Pelvic: External genitalia:  no lesions              Urethra:  normal appearing urethra with no masses, tenderness or lesions              Bartholins and Skenes: normal                 Vagina:  Erythema of mucosa, no drainage, no lesions, second degree rectocele.              Cervix:  absent                 Bimanual Exam:  Uterus:   absent                                      Adnexa:  no masses                                      Rectovaginal: Confirms                                      Anus:  normal sphincter tone, no lesions  Wet prep - ph 5.5, negative yeast, clue cells, trichomonas.  ASSESSMENT  Atrophic vaginitis.  Rectocele. Declines surgical repair.  Rectal urgency.  Multiple sclerosis.  Diabetes mellitus.   PLAN  Discussion regarding atrophic vaginitis and vaginitis in general.  Discussed vaginal estrogens versus using lubricants - H2O based, cooking oils, and Vit E.   Will use lubricants first.  I did discuss risks of breast cancer, DVT, PE, MI, and stroke with estrogen use.  Rectocele discussed briefly and ACOG literature given.   An After Visit Summary was printed and given to the patient.  45 minutes face to face time of which over 50% was spent in counseling.

## 2014-08-18 ENCOUNTER — Encounter: Payer: Self-pay | Admitting: Obstetrics and Gynecology

## 2014-09-15 ENCOUNTER — Encounter: Payer: Self-pay | Admitting: Obstetrics and Gynecology

## 2016-01-13 DIAGNOSIS — C4361 Malignant melanoma of right upper limb, including shoulder: Secondary | ICD-10-CM | POA: Insufficient documentation

## 2016-02-24 ENCOUNTER — Other Ambulatory Visit: Payer: Self-pay | Admitting: Physician Assistant

## 2016-02-24 DIAGNOSIS — R609 Edema, unspecified: Secondary | ICD-10-CM

## 2016-02-26 ENCOUNTER — Ambulatory Visit
Admission: RE | Admit: 2016-02-26 | Discharge: 2016-02-26 | Disposition: A | Discharge status: Home / Self Care | Payer: 59 | Source: Ambulatory Visit | Attending: Physician Assistant | Admitting: Physician Assistant

## 2016-02-26 ENCOUNTER — Other Ambulatory Visit: Payer: Self-pay

## 2016-02-26 DIAGNOSIS — R609 Edema, unspecified: Secondary | ICD-10-CM

## 2016-05-06 ENCOUNTER — Other Ambulatory Visit: Payer: Self-pay | Admitting: Physician Assistant

## 2016-05-06 DIAGNOSIS — R1032 Left lower quadrant pain: Secondary | ICD-10-CM

## 2016-05-08 ENCOUNTER — Encounter (HOSPITAL_COMMUNITY): Payer: Self-pay | Admitting: Emergency Medicine

## 2016-05-08 ENCOUNTER — Emergency Department (HOSPITAL_COMMUNITY): Payer: 59

## 2016-05-08 ENCOUNTER — Emergency Department (HOSPITAL_COMMUNITY)
Admission: EM | Admit: 2016-05-08 | Discharge: 2016-05-08 | Disposition: A | Discharge status: Home / Self Care | Payer: 59 | Attending: Emergency Medicine | Admitting: Emergency Medicine

## 2016-05-08 DIAGNOSIS — S6391XA Sprain of unspecified part of right wrist and hand, initial encounter: Secondary | ICD-10-CM | POA: Diagnosis not present

## 2016-05-08 DIAGNOSIS — Y929 Unspecified place or not applicable: Secondary | ICD-10-CM | POA: Diagnosis not present

## 2016-05-08 DIAGNOSIS — Y999 Unspecified external cause status: Secondary | ICD-10-CM | POA: Insufficient documentation

## 2016-05-08 DIAGNOSIS — W010XXA Fall on same level from slipping, tripping and stumbling without subsequent striking against object, initial encounter: Secondary | ICD-10-CM | POA: Insufficient documentation

## 2016-05-08 DIAGNOSIS — E119 Type 2 diabetes mellitus without complications: Secondary | ICD-10-CM | POA: Insufficient documentation

## 2016-05-08 DIAGNOSIS — E785 Hyperlipidemia, unspecified: Secondary | ICD-10-CM | POA: Insufficient documentation

## 2016-05-08 DIAGNOSIS — Z87891 Personal history of nicotine dependence: Secondary | ICD-10-CM | POA: Diagnosis not present

## 2016-05-08 DIAGNOSIS — S6991XA Unspecified injury of right wrist, hand and finger(s), initial encounter: Secondary | ICD-10-CM | POA: Diagnosis present

## 2016-05-08 DIAGNOSIS — Y939 Activity, unspecified: Secondary | ICD-10-CM | POA: Insufficient documentation

## 2016-05-08 MED ORDER — OXYCODONE-ACETAMINOPHEN 5-325 MG PO TABS
1.0000 | ORAL_TABLET | Freq: Once | ORAL | Status: AC
  Administered 2016-05-08: 1 via ORAL
  Filled 2016-05-08: qty 1

## 2016-05-08 MED ORDER — OXYCODONE-ACETAMINOPHEN 5-325 MG PO TABS: 1.0000 | ORAL_TABLET | Freq: Four times a day (QID) | ORAL | Status: DC | PRN

## 2016-05-08 MED ORDER — IBUPROFEN 800 MG PO TABS
800.0000 mg | ORAL_TABLET | Freq: Once | ORAL | Status: AC
  Administered 2016-05-08: 800 mg via ORAL
  Filled 2016-05-08: qty 1

## 2016-05-08 NOTE — Discharge Instructions (Signed)
Your x-rays are negative for fracture or dislocation. Your examination is consistent with a sprain of the hand, and/or a portion of the wrist. Please use ice for 15 minute intervals over the next 48 hours. Continue your current medications. May use Percocet at bedtime if needed for pain, or every 6 hours for more severe pain. This medication may cause drowsiness, and/or constipation. Please use a stool softener when taking this medication. Intermetacarpal Sprain The intermetacarpal ligaments run between the knuckles at the base of the fingers. These ligaments are vulnerable to sprain and injury in which the ligament becomes overstretched or torn. Intermetacarpal sprains are classified into 3 categories. Grade 1 sprains cause pain, but the tendon is not lengthened. Grade 2 sprains include a lengthened ligament, due to the ligament being stretched or partially ruptured. With grade 2 sprains there is still function, although function may be decreased. Grade 3 sprains include a complete tear of the ligament, and the joint usually displays a loss of function.  SYMPTOMS   Severe pain at the time of injury.  Often, a feeling of popping or tearing inside the hand.  Tenderness and inflammation at the knuckles.  Bruising within a couple days of injury.  Impaired ability to use the hand. CAUSES  This condition occurs when the intermetacarpal ligaments are subjected to a greater stress than they can handle. This causes the ligaments to become stretched or torn. RISK INCREASES WITH:  Previous hand injury.  Fighting sports (boxing, wrestling, martial arts).  Sports in which you could fall on an outstretched hand (soccer, basketball, volleyball).  Other sports with repeated hand trauma (water polo, gymnastics).  Poor hand strength and flexibility.  Inadequate or poorly fitted protective equipment. PREVENTION   Warm up and stretch properly before activity.  Maintain appropriate conditioning:  Hand  flexibility.  Muscle strength and endurance.  Applying tape, protective strapping, or a brace may help prevent injury.  Provide the hand with support during sports and practice activities for 6 to 12 months following injury. PROGNOSIS  With proper treatment, healing should occur without impairment. The length of healing varies from 2 to 12 weeks, depending on the severity of injury. RELATED COMPLICATIONS   Longer healing time, if activities are resumed too soon.  Recurring symptoms or repeated injury, resulting in a chronic problem.  Injury to other nearby structures (bone, cartilage, tendon).  Arthritis of the knuckle (intermetacarpal) joint, with repeated sprains.  Prolonged disability (sometimes).  Hand and finger stiffness or weakness. TREATMENT Treatment first involves ice and medicine to reduce pain and inflammation. An elastic compression bandage may be worn to reduce discomfort and to protect the area. Depending on the severity of injury, you may be required to restrain the area with a cast, splint, or brace. After the ligament has been allowed to heal, strengthening and stretching exercises may be needed to regain strength and a full range of motion. Exercises may be completed at home or with a therapist. Surgery is rarely needed. MEDICATION   If pain medicine is needed, nonsteroidal anti-inflammatory medicines (aspirin and ibuprofen), or other minor pain relievers (acetaminophen), are often advised.  Do not take pain medicine for 7 days before surgery.  Stronger pain relievers may be prescribed if your caregiver thinks they are needed. Use only as directed and only as much as you need. HEAT AND COLD  Cold treatment (icing) should be applied for 10 to 15 minutes every 2 to 3 hours for inflammation and pain, and immediately after activity that aggravates  your symptoms. Use ice packs or an ice massage.  Heat treatment may be used before performing stretching and  strengthening activities prescribed by your caregiver, physical therapist, or athletic trainer. Use a heat pack or a warm water soak. SEEK MEDICAL CARE IF:   Symptoms remain or get worse, despite treatment for longer than 2 to 4 weeks.  You experience pain, numbness, discoloration, or coldness in the hand or fingers.  You develop blue, gray, or dark fingernails.  Any of the following occur after surgery: increased pain, swelling, redness, drainage of fluids, bleeding in the affected area, or signs of infection, including fever.  New, unexplained symptoms develop. (Drugs used in treatment may produce side effects.)   This information is not intended to replace advice given to you by your health care provider. Make sure you discuss any questions you have with your health care provider.   Document Released: 10/31/2005 Document Revised: 11/21/2014 Document Reviewed: 04/20/2015 Elsevier Interactive Patient Education [employer].

## 2016-05-08 NOTE — ED Notes (Signed)
Pt reports falling down today after tripping and has pain to right wrist from catching self.  Pt denies loc/head injury.

## 2016-05-08 NOTE — ED Provider Notes (Signed)
History  By signing my name below, I, Bea Graff, attest that this documentation has been prepared under the direction and in the presence of Lily Kocher, PA-C. Electronically Signed: Bea Graff, ED Scribe. 05/08/2016. 7:23 PM.  Chief Complaint  Patient presents with  . Wrist Pain   The history is provided by the patient and medical records. No language interpreter was used.    HPI Comments:  Rachel Coffey is a 65 y.o. female who presents to the Emergency Department complaining of right wrist pain that began secondary to tripping and falling on the outstretched right hand that occurred PTA. She reports associated swelling of the right wrist. She has not taken anything for pain. Touching the area or trying to move the right wrist increases the pain. She denies numbness, tingling or weakness of the right hand, wrist or RUE, head trauma, LOC.  Past Medical History  Diagnosis Date  . Diabetes mellitus without complication ([place])   . Hyperlipidemia   . MS (multiple sclerosis) ([place])   . Asthma     stress induced with allergies  . Neuromuscular disorder ([place])     dx. "16- MS" -Dr. Barkley Boards, [place](Neuropathy and "band around the chest"  . Genital warts   . STD (sexually transmitted disease)     genital warts--years ago   Past Surgical History  Procedure Laterality Date  . Colonoscopy  02-19-13    1 polyp removal in past  . Tonsillectomy    . Cholecystectomy N/A 02/25/2013    Procedure: LAPAROSCOPIC CHOLECYSTECTOMY WITH INTRAOPERATIVE CHOLANGIOGRAM;  Surgeon: Earnstine Regal, MD;  Location: [facility];  Service: General;  Laterality: N/A;  . Abdominal hysterectomy      with 1 ovary removed-TVH   Family History  Problem Relation Age of Onset  . COPD Mother   . Heart disease Mother   . Diabetes Father   . Heart disease Sister    Social History  Substance Use Topics  . Smoking status: Former Smoker    Types: Cigarettes    Quit date: 01/31/2005  . Smokeless tobacco:  Never Used  . Alcohol Use: No   OB History    Gravida Para Term Preterm AB TAB SAB Ectopic Multiple Living   3 2 2  1  1   2      Review of Systems  Musculoskeletal: Positive for arthralgias.  Neurological: Negative for syncope.  All other systems reviewed and are negative.   Allergies  Codeine; Fenofibrate; and Hydrocodone  Home Medications   Prior to Admission medications   Medication Sig Start Date End Date Taking? Authorizing Provider  albuterol (PROVENTIL HFA;VENTOLIN HFA) 108 (90 BASE) MCG/ACT inhaler Inhale 2 puffs into the lungs every 6 (six) hours as needed for wheezing.    Historical Provider, MD  ALPRAZolam Duanne Moron) 0.5 MG tablet Take 0.5 mg by mouth daily as needed (for pain).     Historical Provider, MD  Calcium Carbonate-Vitamin D (CALCIUM 600+D) 600-400 MG-UNIT per tablet Take 1 tablet by mouth daily.    Historical Provider, MD  calcium-vitamin D 250-100 MG-UNIT per tablet Take 1 tablet by mouth 2 (two) times daily.    Historical Provider, MD  cholecalciferol (VITAMIN D) 1000 UNITS tablet Take 500 Units by mouth daily.    Historical Provider, MD  Choline Fenofibrate (TRILIPIX) 135 MG capsule Take 135 mg by mouth daily.    Historical Provider, MD  Cinnamon 500 MG TABS Take 500 mg by mouth daily.    Historical Provider, MD  cyclobenzaprine (FLEXERIL)  10 MG tablet Take 10 mg by mouth 3 (three) times daily as needed for muscle spasms.    Historical Provider, MD  glipiZIDE (GLUCOTROL XL) 2.5 MG 24 hr tablet Take 2.5 mg by mouth 2 (two) times daily.     Historical Provider, MD  lisinopril (PRINIVIL,ZESTRIL) 10 MG tablet Take 10 mg by mouth every morning.     Historical Provider, MD  Multiple Vitamins-Minerals (MULTIVITAMIN WITH MINERALS) tablet Take 1 tablet by mouth daily.    Historical Provider, MD  simvastatin (ZOCOR) 20 MG tablet Take 20 mg by mouth at bedtime.    Historical Provider, MD  traMADol (ULTRAM) 50 MG tablet Take 1 tablet (50 mg total) by mouth every 6 (six)  hours as needed for pain. 02/26/13   Armandina Gemma, MD   Triage Vitals: BP 103/74 mmHg  Pulse 87  Temp(Src) 98.2 F (36.8 C) (Oral)  Resp 18  Ht 5\' 7"  (1.702 m)  Wt 184 lb (83.462 kg)  BMI 28.81 kg/m2  SpO2 97% Physical Exam  Constitutional: She is oriented to person, place, and time. She appears well-developed and well-nourished.  HENT:  Head: Normocephalic and atraumatic.  Eyes: EOM are normal.  Neck: Normal range of motion.  Cardiovascular: Normal rate.   Cap refill of right hand less than 2 seconds. Right radial pulse is 2+.  Pulmonary/Chest: Effort normal.  Musculoskeletal: Normal range of motion.  Small bruise to anterior right shoulder. Soreness to palpation of anterior right shoulder. No deformity of right humerus. No effusion of the right elbow. No hematoma of the right forearm. Tenderness to palpation and swelling at 2nd and 3rd MCP joints. Tenderness of right thumb. Minimal swelling.  Neurological: She is alert and oriented to person, place, and time.  Skin: Skin is warm and dry.  Psychiatric: She has a normal mood and affect. Her behavior is normal.  Nursing note and vitals reviewed.   ED Course  Procedures (including critical care time) DIAGNOSTIC STUDIES: Oxygen Saturation is 97% on RA, normal by my interpretation.   COORDINATION OF CARE: 7:22 PM- Will order X-Ray of right hand and provide ice pack for swelling. Pt verbalizes understanding and agrees to plan.  Medications - No data to display  Labs Review Labs Reviewed - No data to display  Imaging Review Dg Wrist Complete Right  05/08/2016  CLINICAL DATA:  Right wrist pain after fall. EXAM: RIGHT WRIST - COMPLETE 3+ VIEW COMPARISON:  None. FINDINGS: Osseous alignment is normal. No fracture line or displaced fracture fragment seen. No significant degenerative change. Adjacent soft tissues are unremarkable. IMPRESSION: Negative. Electronically Signed   By: Franki Cabot M.D.   On: 05/08/2016 18:59   I have  personally reviewed and evaluated these images and lab results as part of my medical decision-making.   EKG Interpretation None      MDM  X-ray of the right wrist is negative for acute fracture. X-ray of the right hand shows degenerative changes, but no acute fracture. Vital signs are within normal limits.  Suspect the patient has a strain/sprain of the right hand/wrist. Patient will use an ice pack for 15 minute intervals. She has mobic daily. Will add Percocet at bedtime, and or every 6 hours if needed for more severe pain. The patient will follow-up with orthopedics if not improving. Patient is in agreement with this discharge plan.    Final diagnoses:  None    *I have reviewed nursing notes, vital signs, and all appropriate lab and imaging results for this  patient.**  I personally performed the services described in this documentation, which was scribed in my presence. The recorded information has been reviewed and is accurate.    Lily Kocher, PA-C 05/08/16 2051  Fredia Sorrow, MD 05/11/16 [phone]

## 2016-05-11 ENCOUNTER — Ambulatory Visit
Admission: RE | Admit: 2016-05-11 | Discharge: 2016-05-11 | Disposition: A | Discharge status: Home / Self Care | Payer: 59 | Source: Ambulatory Visit | Attending: Physician Assistant | Admitting: Physician Assistant

## 2016-05-11 DIAGNOSIS — R1032 Left lower quadrant pain: Secondary | ICD-10-CM

## 2016-05-11 MED ORDER — IOPAMIDOL (ISOVUE-300) INJECTION 61%
100.0000 mL | Freq: Once | INTRAVENOUS | Status: AC | PRN
  Administered 2016-05-11: 100 mL via INTRAVENOUS

## 2016-05-31 ENCOUNTER — Telehealth: Payer: Self-pay | Admitting: Oncology

## 2016-05-31 ENCOUNTER — Ambulatory Visit (HOSPITAL_BASED_OUTPATIENT_CLINIC_OR_DEPARTMENT_OTHER): Payer: 59 | Admitting: Oncology

## 2016-05-31 VITALS — BP 110/54 | HR 83 | Temp 98.2°F | Resp 18 | Ht 67.0 in | Wt 186.9 lb

## 2016-05-31 DIAGNOSIS — E119 Type 2 diabetes mellitus without complications: Secondary | ICD-10-CM

## 2016-05-31 DIAGNOSIS — R591 Generalized enlarged lymph nodes: Secondary | ICD-10-CM | POA: Diagnosis not present

## 2016-05-31 DIAGNOSIS — Z85820 Personal history of malignant melanoma of skin: Secondary | ICD-10-CM

## 2016-05-31 DIAGNOSIS — G35 Multiple sclerosis: Secondary | ICD-10-CM

## 2016-05-31 NOTE — Progress Notes (Signed)
Please see consult note.  

## 2016-05-31 NOTE — Telephone Encounter (Signed)
Gave  Pt cal & avs

## 2016-05-31 NOTE — Consult Note (Signed)
Reason for Referral: Abdominal adenopathy.   HPI: 65 year old woman currently resides in [place] after living in multiple locations. She is a pleasant woman with history of diabetes and multiple sclerosis. She is currently not receiving any therapy for her MS. She was found to have a melanoma on her right forearm diagnosed in February 2017. The melanoma was noted due to be at the 0.69 mm with Clark's level IV and no ulceration. She subsequently underwent wide excision and sentinel lymph node biopsy in March 2017 at [facility] and the final staging was stage Ib T1b N0. She subsequently started developing respiratory symptoms including cough and bronchitis and developed abdominal discomfort after persistent cough. She was evaluated by her gastroenterologist and a CT scan of the abdomen and pelvis done on 05/11/2016 showed abdominal adenopathy. The CT scan showed significant adenopathy in the gastrohepatic area as well as the portacaval and the periaortic region. Anterior celiac axis lymph node measuring 3.4 x 4.1 cm. Mesenteric lymph node measuring 4.3 x 6 cm. This on these findings patient referred to me for evaluation clinically, she continues to have lower pelvic pain associated with these findings. Her pain is rather dull and intermittent in nature. Is not associated with any nausea, vomiting or weight loss. Her appetite, performance status remains reasonable. She denied any constitutional symptoms of fevers or chills or sweats. She denied any decline in her ability to perform activities of daily living. She denied any lymphadenopathy anywhere else. She denies any major changes in her bowel habits. He is up-to-date on colonoscopies.  She denies any headaches, blurry vision, syncope or seizures. She does not report any fevers, chills, sweats or weight loss. He does not report any other satiety or abdominal distention. She does not report any chest pain, palpitation or leg edema. She  does not report any cough, wheezing or hemoptysis. She does not report any nausea, abdominal pain or diarrhea. She does not report any frequency urgency or hesitancy. She does not report any skeletal complaints. She denied any new neurological deficits. Remaining review of systems unremarkable.   Past Medical History  Diagnosis Date  . Diabetes mellitus without complication ([place])   . Hyperlipidemia   . MS (multiple sclerosis) ([place])   . Asthma     stress induced with allergies  . Neuromuscular disorder ([place])     dx. "41- MS" -Dr. Barkley Boards, [place](Neuropathy and "band around the chest"  . Genital warts   . STD (sexually transmitted disease)     genital warts--years ago  :  Past Surgical History  Procedure Laterality Date  . Colonoscopy  02-19-13    1 polyp removal in past  . Tonsillectomy    . Cholecystectomy N/A 02/25/2013    Procedure: LAPAROSCOPIC CHOLECYSTECTOMY WITH INTRAOPERATIVE CHOLANGIOGRAM;  Surgeon: Earnstine Regal, MD;  Location: [facility];  Service: General;  Laterality: N/A;  . Abdominal hysterectomy      with 1 ovary removed-TVH  :   Current outpatient prescriptions:  .  albuterol (PROVENTIL HFA;VENTOLIN HFA) 108 (90 BASE) MCG/ACT inhaler, Inhale 2 puffs into the lungs every 6 (six) hours as needed for wheezing., Disp: , Rfl:  .  ALPRAZolam (XANAX) 0.5 MG tablet, Take 0.5 mg by mouth daily as needed (for pain). , Disp: , Rfl:  .  Calcium Carbonate-Vitamin D (CALCIUM 600+D) 600-400 MG-UNIT per tablet, Take 1 tablet by mouth daily., Disp: , Rfl:  .  cholecalciferol (VITAMIN D) 1000 UNITS tablet, Take 2,000 Units by mouth daily. , Disp: ,  Rfl:  .  cyclobenzaprine (FLEXERIL) 10 MG tablet, Take 10 mg by mouth 3 (three) times daily as needed for muscle spasms., Disp: , Rfl:  .  glipiZIDE (GLUCOTROL XL) 2.5 MG 24 hr tablet, Take 2.5 mg by mouth 2 (two) times daily. , Disp: , Rfl:  .  lisinopril (PRINIVIL,ZESTRIL) 10 MG tablet, Take 10 mg by mouth every morning. , Disp: , Rfl:  .   meloxicam (MOBIC) 15 MG tablet, Take 15 mg by mouth daily. Prn, Disp: , Rfl:  .  NON FORMULARY, Apply 100 mg topically as needed., Disp: , Rfl:  .  Probiotic Product (PROBIOTIC PEARLS PO), Take 1 tablet by mouth daily., Disp: , Rfl:  .  simvastatin (ZOCOR) 20 MG tablet, Take 20 mg by mouth at bedtime., Disp: , Rfl: :  Allergies  Allergen Reactions  . Codeine Itching  . Fenofibrate     aching joints  . Hydrocodone Itching  :  Family History  Problem Relation Age of Onset  . COPD Mother   . Heart disease Mother   . Diabetes Father   . Heart disease Sister   :  Social History   Social History  . Marital Status: Married    Spouse Name: N/A  . Number of Children: N/A  . Years of Education: N/A   Occupational History  . Not on file.   Social History Main Topics  . Smoking status: Former Smoker    Types: Cigarettes    Quit date: 01/31/2005  . Smokeless tobacco: Never Used  . Alcohol Use: No  . Drug Use: No  . Sexual Activity: No     Comment: TVH--still has 1 ovary   Other Topics Concern  . Not on file   Social History Narrative  :  Pertinent items are noted in HPI.  Exam: Blood pressure 110/54, pulse 83, temperature 98.2 F (36.8 C), temperature source Oral, resp. rate 18, height 5\' 7"  (1.702 m), weight 186 lb 14.4 oz (84.777 kg), SpO2 98 %.  ECOG 0  General appearance: alert and cooperative. Without distress. Head: Normocephalic, without obvious abnormality Throat: No oral thrush. Neck: no adenopathy Back: negative Resp: clear to auscultation bilaterally without rhonchi, wheezes or dullness to percussion. Chest wall: no tenderness Cardio: regular rate and rhythm, S1, S2 normal, no murmur, click, rub or gallop GI: soft, non-tender; bowel sounds normal; no masses,  no organomegaly Extremities: extremities normal, atraumatic, no cyanosis or edema Pulses: 2+ and symmetric Skin: Skin color, texture, turgor normal. No rashes or lesions Lymph nodes: Cervical,  supraclavicular, and axillary nodes normal.     Ct Abdomen Pelvis W Contrast  05/12/2016  CLINICAL DATA:  Left lower quadrant pain 5 weeks. Previous hysterectomy and cholecystectomy. Newly diagnosed melanoma right arm post resection. EXAM: CT ABDOMEN AND PELVIS WITH CONTRAST TECHNIQUE: Multidetector CT imaging of the abdomen and pelvis was performed using the standard protocol following bolus administration of intravenous contrast. CONTRAST:  163mL ISOVUE-300 IOPAMIDOL (ISOVUE-300) INJECTION 61% COMPARISON:  None. FINDINGS: Lower chest: Lung bases are within normal. Right retrocrural adenopathy. Hepatobiliary: There are a few small scattered liver cysts. Evidence of previous cholecystectomy. Pancreas: No mass, inflammatory changes, or other significant abnormality. Spleen: Within normal limits in size and appearance. Adrenals/Urinary Tract: No masses identified. No evidence of hydronephrosis. Stomach/Bowel: No evidence of obstruction, inflammatory process, or abnormal fluid collections. Normal appendix. Minimal diverticulosis of the colon. Vascular/Lymphatic: There is significant adenopathy over the gastrohepatic ligament, portacaval and periaortic region as well as within the mesentery and  along the iliac chains. Anterior celiac axis lymph node measures 3.4 x 4.1 cm. Lower midline mesenteric lymph node measures 4.3 x 6 cm. Minimal calcified plaque over the abdominal aorta and iliac arteries. Reproductive: Previous hysterectomy. Adnexal regions are within normal. Other: None. Musculoskeletal: Small focal sclerotic density over the left inferior pubic ramus likely a bone island, although cannot exclude metastatic disease. IMPRESSION: No acute findings in the abdomen/pelvis. Extensive adenopathy as described within the right retrocrural region, abdomen/retroperitoneum and pelvis. Largest node over the mesentery of the midline lower abdomen measuring 4.3 x 6 cm. Findings may be due to metastatic disease in this  patient with newly diagnosed melanoma. Other neoplastic processes such as lymphoma/leukemia should be considered. Few small liver cysts. Minimal diverticulosis of the colon. Solitary sclerotic focus over the left inferior pubic ramus likely a bone island, although cannot exclude metastatic disease. Electronically Signed   By: Marin Olp M.D.   On: 05/12/2016 08:26      Assessment and Plan:    65 year old woman with the following issues:  1. Extensive adenopathy in the abdomen and retroperitoneum. The largest lymph node over the mesentery measuring 4.3 x 6 cm. These findings are associated with lower pelvic abdominal pain but no other symptoms. Her physical examination did not show any evidence of palpable adenopathy.  The differential diagnosis was discussed today with the patient and her family. Lymphoproliferative process is the most likely diagnosis with conditions such as lymphoma would be a very likely presentation. Other considerations would include metastatic solid tumor malignancy with abdominal adenopathy. Metastatic melanoma as possible but less likely given her early stage disease. Metastatic colon cancer would also be less likely given her vigilance and surveillance.  Her management standpoint, staging PET CT scan would be required to determine any other areas of lymphadenopathy and to complete the staging process. This will also an important step in determining tissue sampling. Given the possibility of lymphoma, and excisional biopsy would be preferred at this time.  I will refer her to [facility] surgery where she has been seen in the past by Dr. Harlow Asa. I will kindly asked him to perform an excisional biopsy after her PET scan is completed. The biopsy could be of her mesenteric adenopathy or potentially from a different site that would be easily accessible such as axillary or supraclavicular area.  I have discussed the role of core biopsies in this particular setting  utilizing interventional radiology. I feel the likelihood of obtaining adequate sample that will determine not only the diagnosis but the grade of her lymphoma would be problematic. Again to spare her and necessary procedures, I prefer that she undergoes an excisional biopsy by Gen. surgery if possible. I alerted her to the possibility that she might require a laparoscopic procedure or potentially laparotomy.  We have discussed treatment option at this time which certainly will be dictated by the nature of her illness. It is very likely if we are dealing with lymphoma that was she will require treatment given the bulk of her disease.  2. History of melanoma: Her melanoma is early stage at 0.69 mm with negative sentinel lymph node sampling completed in March 2017. This is less likely to spread at this time but continuous monitoring will be needed.  3. IV access: She will likely require a Port-A-Cath insertion if we are dealing with advanced malignancy. If she has lymphoma, she will require treatment to in her abdominal symptomatology and bulky disease. It would be reasonable to have a Port-A-Cath  insertion at the same time of her biopsy if that makes the logistics easier for her and Dr. Harlow Asa.   4. Follow-up: Will be upon completing her PET scan and tissue biopsy.

## 2016-06-02 ENCOUNTER — Telehealth: Payer: Self-pay | Admitting: Oncology

## 2016-06-02 NOTE — Telephone Encounter (Signed)
Faxed records to Dr. Gerkin °

## 2016-06-03 ENCOUNTER — Telehealth: Payer: Self-pay | Admitting: Oncology

## 2016-06-03 DIAGNOSIS — R590 Localized enlarged lymph nodes: Secondary | ICD-10-CM | POA: Insufficient documentation

## 2016-06-03 NOTE — Telephone Encounter (Signed)
Pt called in with concerns and requested to be transfer to Dr. Benay Spice due to other family members being treated by him. She verbalized a dis-connection with current md. Pt family member (daughter) also commented on reasons she wanted her mother to tranfer care to Dr. Benay Spice. Practice Administrator was made aware of pt's concerns and assisted with the transfer of care to Dr. Benay Spice. Pt's records was provided to  Dr Benay Spice to  review her records and provide an appt date/time.

## 2016-06-13 ENCOUNTER — Ambulatory Visit (HOSPITAL_COMMUNITY)
Admission: RE | Admit: 2016-06-13 | Discharge: 2016-06-13 | Disposition: A | Discharge status: Home / Self Care | Payer: 59 | Source: Ambulatory Visit | Attending: Oncology | Admitting: Oncology

## 2016-06-13 DIAGNOSIS — R591 Generalized enlarged lymph nodes: Secondary | ICD-10-CM

## 2016-06-13 DIAGNOSIS — C8590 Non-Hodgkin lymphoma, unspecified, unspecified site: Secondary | ICD-10-CM | POA: Insufficient documentation

## 2016-06-13 LAB — GLUCOSE, CAPILLARY: Glucose-Capillary: 125 mg/dL — ABNORMAL HIGH (ref 65–99)

## 2016-06-13 MED ORDER — FLUDEOXYGLUCOSE F - 18 (FDG) INJECTION
8.9000 | Freq: Once | INTRAVENOUS | Status: AC | PRN
  Administered 2016-06-13: 8.9 via INTRAVENOUS

## 2016-06-17 ENCOUNTER — Other Ambulatory Visit (HOSPITAL_COMMUNITY): Payer: Self-pay | Admitting: Surgery

## 2016-06-17 ENCOUNTER — Other Ambulatory Visit: Payer: Self-pay | Admitting: Radiology

## 2016-06-17 DIAGNOSIS — R590 Localized enlarged lymph nodes: Secondary | ICD-10-CM

## 2016-06-20 ENCOUNTER — Ambulatory Visit (HOSPITAL_COMMUNITY)
Admission: RE | Admit: 2016-06-20 | Discharge: 2016-06-20 | Disposition: A | Discharge status: Home / Self Care | Payer: 59 | Source: Ambulatory Visit | Attending: Surgery | Admitting: Surgery

## 2016-06-20 ENCOUNTER — Encounter (HOSPITAL_COMMUNITY): Payer: Self-pay

## 2016-06-20 ENCOUNTER — Telehealth: Payer: Self-pay | Admitting: *Deleted

## 2016-06-20 DIAGNOSIS — R590 Localized enlarged lymph nodes: Secondary | ICD-10-CM

## 2016-06-20 DIAGNOSIS — Z7984 Long term (current) use of oral hypoglycemic drugs: Secondary | ICD-10-CM | POA: Insufficient documentation

## 2016-06-20 DIAGNOSIS — Z8249 Family history of ischemic heart disease and other diseases of the circulatory system: Secondary | ICD-10-CM | POA: Diagnosis not present

## 2016-06-20 DIAGNOSIS — Z833 Family history of diabetes mellitus: Secondary | ICD-10-CM | POA: Insufficient documentation

## 2016-06-20 DIAGNOSIS — C8583 Other specified types of non-Hodgkin lymphoma, intra-abdominal lymph nodes: Secondary | ICD-10-CM | POA: Insufficient documentation

## 2016-06-20 DIAGNOSIS — Z825 Family history of asthma and other chronic lower respiratory diseases: Secondary | ICD-10-CM | POA: Diagnosis not present

## 2016-06-20 DIAGNOSIS — Z87891 Personal history of nicotine dependence: Secondary | ICD-10-CM | POA: Insufficient documentation

## 2016-06-20 DIAGNOSIS — E785 Hyperlipidemia, unspecified: Secondary | ICD-10-CM | POA: Diagnosis not present

## 2016-06-20 DIAGNOSIS — Z79899 Other long term (current) drug therapy: Secondary | ICD-10-CM | POA: Diagnosis not present

## 2016-06-20 DIAGNOSIS — G35 Multiple sclerosis: Secondary | ICD-10-CM | POA: Insufficient documentation

## 2016-06-20 DIAGNOSIS — R591 Generalized enlarged lymph nodes: Secondary | ICD-10-CM | POA: Diagnosis present

## 2016-06-20 DIAGNOSIS — Z85820 Personal history of malignant melanoma of skin: Secondary | ICD-10-CM | POA: Insufficient documentation

## 2016-06-20 DIAGNOSIS — E119 Type 2 diabetes mellitus without complications: Secondary | ICD-10-CM | POA: Insufficient documentation

## 2016-06-20 LAB — APTT: APTT: 28 s (ref 24–36)

## 2016-06-20 LAB — CBC
HCT: 43.6 % (ref 36.0–46.0)
HEMOGLOBIN: 14.3 g/dL (ref 12.0–15.0)
MCH: 29.5 pg (ref 26.0–34.0)
MCHC: 32.8 g/dL (ref 30.0–36.0)
MCV: 89.9 fL (ref 78.0–100.0)
Platelets: 168 10*3/uL (ref 150–400)
RBC: 4.85 MIL/uL (ref 3.87–5.11)
RDW: 12.3 % (ref 11.5–15.5)
WBC: 5 10*3/uL (ref 4.0–10.5)

## 2016-06-20 LAB — GLUCOSE, CAPILLARY: Glucose-Capillary: 109 mg/dL — ABNORMAL HIGH (ref 65–99)

## 2016-06-20 LAB — PROTIME-INR
INR: 0.94
PROTHROMBIN TIME: 12.6 s (ref 11.4–15.2)

## 2016-06-20 MED ORDER — MIDAZOLAM HCL 2 MG/2ML IJ SOLN
INTRAMUSCULAR | Status: AC
  Administered 2016-06-20: 2 mg via INTRAVASCULAR
  Filled 2016-06-20: qty 2

## 2016-06-20 MED ORDER — FENTANYL CITRATE (PF) 100 MCG/2ML IJ SOLN
INTRAMUSCULAR | Status: AC
  Administered 2016-06-20: 100 ug via INTRAVASCULAR
  Filled 2016-06-20: qty 2

## 2016-06-20 MED ORDER — FENTANYL CITRATE (PF) 100 MCG/2ML IJ SOLN
INTRAMUSCULAR | Status: AC | PRN
  Administered 2016-06-20 (×2): 50 ug via INTRAVENOUS

## 2016-06-20 MED ORDER — SODIUM CHLORIDE 0.9 % IV SOLN: INTRAVENOUS | Status: DC

## 2016-06-20 MED ORDER — MIDAZOLAM HCL 2 MG/2ML IJ SOLN
INTRAMUSCULAR | Status: AC | PRN
  Administered 2016-06-20 (×2): 1 mg via INTRAVENOUS

## 2016-06-20 MED ORDER — LIDOCAINE HCL 1 % IJ SOLN
INTRAMUSCULAR | Status: AC
  Administered 2016-06-20: 10 mL via INTRAMUSCULAR
  Filled 2016-06-20: qty 20

## 2016-06-20 NOTE — H&P (Signed)
Chief Complaint: Lymphadenopathy  Referring Physician(s): Gerkin,Todd  Supervising Physician: Arne Cleveland  Patient Status: Outpatient  History of Present Illness: Rachel Coffey is a 65 y.o. female with history of diabetes and multiple sclerosis.   She is currently not receiving any therapy for her MS.   She was found to have a melanoma on her right forearm diagnosed in February 2017.   It measured 0.69 mm with Clark's level IV and no ulceration.   She subsequently underwent wide excision and sentinel lymph node biopsy in March 2017 at [facility] and the final staging was stage Ib T1b N0.   Afterwards she started developing respiratory symptoms including cough and bronchitis and developed abdominal discomfort after persistent cough.   She was evaluated by her gastroenterologist and a CT scan of the abdomen and pelvis done on 05/11/2016 showed an anterior celiac axis lymph node which measured 3.4 x 4.1 cm and a mesenteric lymph node which measured 4.3 x 6 cm.   PET scan done 06/13/2016 revealed abdominopelvic lymphadenopathy including:  --2.9 cm short axis node in the porta hepatis (series 4/ image 108), max SUV 6.1  --1.4 cm short axis left para-aortic node (series 4/ image 119), max SUV 3.7  --2.1 cm short axis node in the right jejunal mesentery (series 4/image 126), max SUV 4.7  --3.9 cm short axis node in the left lower jejunal mesentery (series 4/ image 154), max SUV 6.5   We are asked to perform an image guided biopsy today.  She denies any recent illness, fever or chills .  She does not take blood thinners.  Past Medical History:  Diagnosis Date  . Asthma    stress induced with allergies  . Diabetes mellitus without complication ([place])   . Genital warts   . Hyperlipidemia   . MS (multiple sclerosis) ([place])   . Neuromuscular disorder ([place])    dx. "13- MS" -Dr. Barkley Boards, [place](Neuropathy and "band around the chest"  . STD  (sexually transmitted disease)    genital warts--years ago    Past Surgical History:  Procedure Laterality Date  . ABDOMINAL HYSTERECTOMY     with 1 ovary removed-TVH  . CHOLECYSTECTOMY N/A 02/25/2013   Procedure: LAPAROSCOPIC CHOLECYSTECTOMY WITH INTRAOPERATIVE CHOLANGIOGRAM;  Surgeon: Earnstine Regal, MD;  Location: [facility];  Service: General;  Laterality: N/A;  . COLONOSCOPY  02-19-13   1 polyp removal in past  . TONSILLECTOMY      Allergies: Codeine; Fenofibrate; and Hydrocodone  Medications: Prior to Admission medications   Medication Sig Start Date End Date Taking? Authorizing Provider  albuterol (PROVENTIL HFA;VENTOLIN HFA) 108 (90 BASE) MCG/ACT inhaler Inhale 2 puffs into the lungs every 6 (six) hours as needed for wheezing.   Yes Historical Provider, MD  ALPRAZolam Duanne Moron) 0.5 MG tablet Take 0.5 mg by mouth daily as needed (for pain).    Yes Historical Provider, MD  cholecalciferol (VITAMIN D) 1000 UNITS tablet Take 2,000 Units by mouth daily.    Yes Historical Provider, MD  cyclobenzaprine (FLEXERIL) 10 MG tablet Take 10 mg by mouth 3 (three) times daily as needed for muscle spasms.   Yes Historical Provider, MD  diclofenac sodium (VOLTAREN) 1 % GEL Apply 1 application topically daily as needed.   Yes Historical Provider, MD  glipiZIDE (GLUCOTROL XL) 2.5 MG 24 hr tablet Take 2.5 mg by mouth 2 (two) times daily.    Yes Historical Provider, MD  lisinopril (PRINIVIL,ZESTRIL) 10 MG tablet Take 10 mg by mouth  every morning.    Yes Historical Provider, MD  meloxicam (MOBIC) 15 MG tablet Take 15 mg by mouth daily. Prn   Yes Historical Provider, MD  Probiotic Product (PROBIOTIC PEARLS PO) Take 1 tablet by mouth daily.   Yes Historical Provider, MD  simvastatin (ZOCOR) 20 MG tablet Take 20 mg by mouth at bedtime.   Yes Historical Provider, MD     Family History  Problem Relation Age of Onset  . COPD Mother   . Heart disease Mother   . Diabetes Father   . Heart disease Sister      Social History   Social History  . Marital status: Married    Spouse name: N/A  . Number of children: N/A  . Years of education: N/A   Social History Main Topics  . Smoking status: Former Smoker    Types: Cigarettes    Quit date: 01/31/2005  . Smokeless tobacco: Never Used  . Alcohol use No  . Drug use: No  . Sexual activity: No     Comment: TVH--still has 1 ovary   Other Topics Concern  . None   Social History Narrative  . None     Review of Systems: A 12 point ROS discussed Review of Systems  Constitutional: Negative for activity change, appetite change, chills, fatigue and fever.  HENT: Negative.   Respiratory: Positive for cough and shortness of breath.   Cardiovascular: Negative for chest pain.  Gastrointestinal: Negative for abdominal distention and abdominal pain.  Genitourinary: Negative.   Musculoskeletal: Negative.   Neurological: Negative.   Hematological: Negative.   Psychiatric/Behavioral: Negative.     Vital Signs: BP 128/66 (BP Location: Left Arm)   Pulse 72   Temp 98.5 F (36.9 C) (Oral)   Resp 20   Ht 5\' 7"  (1.702 m)   Wt 184 lb (83.5 kg)   SpO2 96%   BMI 28.82 kg/m   Physical Exam  Constitutional: She appears well-developed and well-nourished.  HENT:  Head: Normocephalic and atraumatic.  Neck: Normal range of motion.  Cardiovascular: Normal rate, regular rhythm and normal heart sounds.   No murmur heard. Pulmonary/Chest: Effort normal and breath sounds normal. She has no wheezes.  Abdominal: Soft. She exhibits no distension. There is no tenderness.  Musculoskeletal: Normal range of motion.  Right thumb fracture with immobilizer in place  Skin: Skin is warm and dry.  Psychiatric: She has a normal mood and affect. Her behavior is normal. Judgment and thought content normal.  Vitals reviewed.   Mallampati Score:  MD Evaluation Airway: WNL Heart: WNL Abdomen: WNL Chest/ Lungs: WNL ASA  Classification: 2 Mallampati/Airway  Score: Two  Imaging: Nm Pet Image Initial (pi) Skull Base To Thigh  Result Date: 06/13/2016 CLINICAL DATA:  Initial treatment strategy for lymphoma. EXAM: NUCLEAR MEDICINE PET SKULL BASE TO THIGH TECHNIQUE: 8.9 mCi F-18 FDG was injected intravenously. Full-ring PET imaging was performed from the skull base to thigh after the radiotracer. CT data was obtained and used for attenuation correction and anatomic localization. FASTING BLOOD GLUCOSE:  Value: 125 mg/dl COMPARISON:  None. FINDINGS: NECK No hypermetabolic cervical lymphadenopathy. CHEST No suspicious pulmonary nodules. Mild atherosclerotic calcifications of the thoracic aorta. 12 mm short axis right retrocrural node, max SUV 3.6. Otherwise, no hypermetabolic thoracic lymphadenopathy. ABDOMEN/PELVIS Scattered hepatic cysts. Status post cholecystectomy. Atherosclerotic calcifications of the abdominal aorta and branch vessels. Abdominopelvic lymphadenopathy, including: --2.9 cm short axis node in the porta hepatis (series 4/ image 108), max SUV 6.1 --1.4 cm short  axis left para-aortic node (series 4/ image 119), max SUV 3.7 --2.1 cm short axis node in the right jejunal mesentery (series 4/image 126), max SUV 4.7 --3.9 cm short axis node in the left lower jejunal mesentery (series 4/ image 154), max SUV 6.5 SKELETON No focal hypermetabolic activity to suggest skeletal metastasis. IMPRESSION: Nodal lymphoma within the right retrocrural space, upper abdomen, and jejunal mesentery of the abdomen/pelvis, as described above. Electronically Signed   By: Julian Hy M.D.   On: 06/13/2016 14:10    Labs:  CBC:  Recent Labs  06/20/16 0956  WBC 5.0  HGB 14.3  HCT 43.6  PLT 168    COAGS:  Recent Labs  06/20/16 0956  INR 0.94  APTT 28    BMP: No results for input(s): NA, K, CL, CO2, GLUCOSE, BUN, CALCIUM, CREATININE, GFRNONAA, GFRAA in the last 8760 hours.  Invalid input(s): CMP  LIVER FUNCTION TESTS: No results for input(s): BILITOT,  AST, ALT, ALKPHOS, PROT, ALBUMIN in the last 8760 hours.  TUMOR MARKERS: No results for input(s): AFPTM, CEA, CA199, CHROMGRNA in the last 8760 hours.  Assessment and Plan:  History of melanoma  Abdominopelvic lymphadenopathy  Will proceed with CT guided biopsy today by Dr. Vernard Gambles  Risks and Benefits discussed with the patient including, but not limited to bleeding, infection, damage to adjacent structures or low yield requiring additional tests.  All of the patient's questions were answered, patient is agreeable to proceed. Consent signed and in chart.  Thank you for this interesting consult.  I greatly enjoyed meeting Rachel Coffey and look forward to participating in their care.  A copy of this report was sent to the requesting provider on this date.  Electronically Signed: Murrell Redden PA-C 06/20/2016, 10:48 AM   I spent a total of  30 Minutes in face to face in clinical consultation, greater than 50% of which was counseling/coordinating care for  CT guided biopsy.

## 2016-06-20 NOTE — Sedation Documentation (Signed)
Vital signs stable. 

## 2016-06-20 NOTE — Sedation Documentation (Signed)
Patient is resting comfortably. 

## 2016-06-20 NOTE — Sedation Documentation (Signed)
Pt complains of abdominal pain

## 2016-06-20 NOTE — Sedation Documentation (Signed)
Pt vitals are stable, tolerated procedure well.

## 2016-06-20 NOTE — Telephone Encounter (Signed)
Message from female calling on behalf of pt. "We were told we would have an appt with Dr. Benay Spice within 3 days max."

## 2016-06-20 NOTE — Procedures (Signed)
CT core biopsy mesenteric adenopathy No complication No blood loss. See complete dictation in [facility].

## 2016-06-21 NOTE — Telephone Encounter (Signed)
Call placed to Ms. Palazzo to inform her that Dr. Benay Spice would look at his schedule this evening to set an appt for her.  Pt stated that her daughter came to [facility] today and had meeting with Elizebeth Koller and Laverta Baltimore and that appt is set for 06/28/16.  Pt appreciative of phone call.  Will notify Dr. Benay Spice of above information.

## 2016-06-21 NOTE — Telephone Encounter (Signed)
Call from patient's daughter "Jerilee Field for Coordinator or administration for this provider because we needed an appointment within three to four days.  A Family friend with Lymphoma was treated by Dr. Benay Spice recommended himas a provider is why we cancelled tomorrow's appointment requesting re-assignment.  Not sure why this process is taking so long.  Core Biopsy was done yesterday by Dr. Harlow Asa.  You can reach Mom at [phone]."  Advised all [facility] providers are excellent.  I will submit this request to original provider to discuss transfer with provider of their choice.  F/U was scheduled 06-22-2016 but cancelled by patient's request.

## 2016-06-22 ENCOUNTER — Ambulatory Visit: Payer: 59 | Admitting: Oncology

## 2016-06-23 ENCOUNTER — Telehealth: Payer: Self-pay | Admitting: *Deleted

## 2016-06-23 NOTE — Telephone Encounter (Signed)
Per Dr. Benay Spice, pt notified that biopsy results confirm low grade lymphoma and no melanoma and that he will talk about treatment options vs. Watching her at appt on Tuesday, 06/28/16.  Pt has no questions at this time and is appreciative of call.

## 2016-06-24 ENCOUNTER — Telehealth: Payer: Self-pay | Admitting: Oncology

## 2016-06-24 DIAGNOSIS — C8300 Small cell B-cell lymphoma, unspecified site: Secondary | ICD-10-CM | POA: Insufficient documentation

## 2016-06-24 NOTE — Telephone Encounter (Signed)
Contacted pt and confirmed appointment for next week. Pt mentioned Dr Benay Spice has spoken with her and she is eager to continue conversation and review [place] options.

## 2016-06-28 ENCOUNTER — Telehealth: Payer: Self-pay | Admitting: Oncology

## 2016-06-28 ENCOUNTER — Ambulatory Visit (HOSPITAL_BASED_OUTPATIENT_CLINIC_OR_DEPARTMENT_OTHER): Payer: 59 | Admitting: Oncology

## 2016-06-28 VITALS — BP 113/46 | HR 108 | Temp 98.4°F | Resp 18 | Ht 67.0 in | Wt 185.5 lb

## 2016-06-28 DIAGNOSIS — Z87891 Personal history of nicotine dependence: Secondary | ICD-10-CM

## 2016-06-28 DIAGNOSIS — C4361 Malignant melanoma of right upper limb, including shoulder: Secondary | ICD-10-CM | POA: Diagnosis not present

## 2016-06-28 DIAGNOSIS — G35 Multiple sclerosis: Secondary | ICD-10-CM

## 2016-06-28 DIAGNOSIS — Z809 Family history of malignant neoplasm, unspecified: Secondary | ICD-10-CM

## 2016-06-28 DIAGNOSIS — C8300 Small cell B-cell lymphoma, unspecified site: Secondary | ICD-10-CM | POA: Diagnosis not present

## 2016-06-28 DIAGNOSIS — C9110 Chronic lymphocytic leukemia of B-cell type not having achieved remission: Secondary | ICD-10-CM

## 2016-06-28 DIAGNOSIS — R1032 Left lower quadrant pain: Secondary | ICD-10-CM | POA: Diagnosis not present

## 2016-06-28 DIAGNOSIS — Z801 Family history of malignant neoplasm of trachea, bronchus and lung: Secondary | ICD-10-CM

## 2016-06-28 NOTE — Telephone Encounter (Signed)
Gave patient avs report and appointments for August . Date/time for 8/30 @ 10:30 am w/BS per LOS - per patient due to another medical appointment conflict time pushed to 11 am.

## 2016-06-28 NOTE — Progress Notes (Signed)
[facility] Patient Consult   Referring MD: Kathyrn Lass, Md [place], [place] [address]   Rachel Coffey 65 y.o.  Nov 23, 1950    Reason for Referral: Small lymphocytic lymphoma   HPI: Rachel Coffey reports the onset of intermittent pain in the left lower abdomen beginning in May of this year. She was evaluated at [facility] gastroenterology and treated for diverticulitis. She was referred for CTs of the abdomen and pelvis on 05/11/2016. The spleen appeared normal. Minimal diverticulosis. Significant adenopathy was noted at the gastrohepatic ligament, portacaval, and periaortic regions. Lymphadenopathy was also noted in the mesentery and iliac chains.  She was diagnosed with a melanoma of the right forearm in February 2017. She underwent a wide excision and sentinel lymph node biopsy at [facility] in March 2017 for a stage IB (T1b,N0) lesion.  Dr. Alen Blew referred her for a PET scan on 06/13/2016. This revealed hypermetabolic lymph nodes throughout the abdomen and pelvis. No hypermetabolic lymph nodes in the neck. 12 mm right retrocrural node. No hypermetabolic skeletal lesions.  She was referred to Dr. Harlow Asa for a lymph node biopsy. He recommended a radiologic biopsy. She underwent a CT-guided biopsy of a mesenteric lymph node on 06/20/2016. The pathology from a core needle biopsy [id number] confirmed small lymphocytic lymphoma. Flow cytometry revealed a monoclonal kappa restricted B-cell population expressing CD20 with associated CD5. No CD10 or cyclin D1 expression.  She reports the left lower abdominal discomfort has improved. She has discomfort a few times per week lasting for approximately 1 hour. She is not taking pain medication.     Past Medical History:  Diagnosis Date  . Asthma    stress induced with allergies  . Diabetes mellitus without complication ([place])   . Genital warts   . Hyperlipidemia   . MS (multiple sclerosis) ([place])   .  Neuromuscular disorder ([place])    dx. "68- MS" -Dr. Barkley Boards, (Neuropathy and "band around the chest"  . STD (sexually transmitted disease)    genital warts--years ago    .  G4, P2, 2 miscarriages   .  Right thumb fracture   .  Right forearm melanoma February 2017, 0.69 mm, Clark's level IV, no ulceration   Wide excision and sentinel lymph node biopsy at [facility], stage IB (T1b,N0)  Past Surgical History:  Procedure Laterality Date  . ABDOMINAL HYSTERECTOMY     with 1 ovary removed-[facility]  . CHOLECYSTECTOMY N/A 02/25/2013   Procedure: LAPAROSCOPIC CHOLECYSTECTOMY WITH INTRAOPERATIVE CHOLANGIOGRAM;  Surgeon: Earnstine Regal, MD;  Location: [facility] ORS;  Service: General;  Laterality: N/A;  . COLONOSCOPY  02-19-13   1 polyp removal in past  . TONSILLECTOMY      . Right arm Melanoma excision/sentinel lymph node biopsy                                              March 2017  Medications: Reviewed  Allergies:  Allergies  Allergen Reactions  . Codeine Itching  . Fenofibrate     aching joints  . Hydrocodone Itching    Family history: A maternal aunt had lung cancer, maternal uncle had "liver "cancer. No other family history of cancer.  Social History:   She lives in [place]. She previously worked in an office occupation. She quit smoking cigarettes in 2005. No alcohol use. No transfusion history. No risk factor for  HIV or hepatitis.   ROS:   Positives include: Intermittent dull pain in the left lower abdomen, chronic frequent bowel movements with diarrhea improved with Imodium, fatigue, bronchitis in May, right thumb fracture after a fall, "chokes "easily with solids and liquids-chronic, band of numbness at the left chest with numbness at the left foot and hand secondary to  MS  A complete ROS was otherwise negative.  Physical Exam:  Blood pressure (!) 113/46, pulse (!) 108, temperature 98.4 F (36.9 C), temperature source Oral, resp. rate 18, height 5\' 7"  (1.702 m), weight 185 lb 8  oz (84.1 kg), SpO2 95 %.  HEENT: Oropharynx without visible mass, neck without mass Lungs: Clear bilaterally Cardiac: Regular rate and rhythm Abdomen: No hepatosplenomegaly, no mass, nontender  Vascular: No leg edema Lymph nodes: No cervical, supraclavicular, axillary, or inguinal nodes Neurologic: Alert and oriented, the motor exam appears intact in the upper and lower extremities Skin: No rash Musculoskeletal: No spine tenderness   LAB:  CBC  Lab Results  Component Value Date   WBC 5.0 06/20/2016   HGB 14.3 06/20/2016   HCT 43.6 06/20/2016   MCV 89.9 06/20/2016   PLT 168 06/20/2016   NEUTROABS 2.7 04/02/2010     CMP      Component Value Date/Time   NA 138 02/19/2013 0935   K 4.3 02/19/2013 0935   CL 103 02/19/2013 0935   CO2 30 02/19/2013 0935   GLUCOSE 93 02/19/2013 0935   BUN 19 02/19/2013 0935   CREATININE 0.55 02/19/2013 0935   CALCIUM 9.9 02/19/2013 0935   PROT 6.7 04/02/2010 0000   ALBUMIN 4.3 04/02/2010 0000   AST 34 04/02/2010 0000   ALT 31 04/02/2010 0000   ALKPHOS 42 04/02/2010 0000   BILITOT 0.4 04/02/2010 0000   GFRNONAA >90 02/19/2013 0935   GFRAA >90 02/19/2013 0935    No results found for: CEA1  Imaging:  PET 06/13/2016 and CT abdomen/pelvis 05/12/2016-images were reviewed with Rachel Coffey and her family   Assessment/Plan:   1. Small lymphatic lymphoma/CLL diagnosed on core biopsy of mesenteric lymphadenopathy 06/20/2016  Monoclonal Kappa restricted B-cell population identified on flow cytometry  Staging PET scan 06/13/2016 confirmed hypermetabolic abdominal/pelvic lymphadenopathy  2. Stage IB (T1b,N0) melanoma of the right forearm, status post a wide excision and sentinel lymph node biopsy at [facility] March 2017  3.    Left lower abdominal pain-potentially related to abdominal/pelvic lymphadenopathy  4.    Multiple sclerosis  5.     Diabetes   Disposition:   Rachel Coffey has been diagnosed with small lymphatic lymphoma/CLL  involving abdominal/pelvic lymph nodes. She has mild discomfort in the left lower abdomen. The pain may be unrelated or secondary to lymphadenopathy. She has no other symptoms related to the CLL.  I discussed the diagnosis, prognosis, and treatment options with Rachel Coffey and her family. We discussed indications for treatment. The plan is to follow her with observation for now. We will submit molecular testing on the core biopsy for prognostic information. We will check an LDH and immunoglobulin levels when she returns in 2 weeks.  The plan is to follow her with observation for now. She understands the increased risk of infections in patients with CLL. She will seek medical attention for symptoms of an infection. She will remain up-to-date on the influenza and pneumococcal vaccines.  Approximately 50 minutes were spent with the patient today. The majority of the time was used for counseling and coordination of care.  [name], MD  06/28/2016, 5:17 PM

## 2016-06-29 ENCOUNTER — Telehealth: Payer: Self-pay | Admitting: *Deleted

## 2016-06-29 NOTE — Telephone Encounter (Signed)
Spoke with Maudie Mercury in pathology, requested CLL FISH panel on core biopsy.

## 2016-07-13 ENCOUNTER — Other Ambulatory Visit (HOSPITAL_BASED_OUTPATIENT_CLINIC_OR_DEPARTMENT_OTHER): Payer: 59

## 2016-07-13 ENCOUNTER — Ambulatory Visit (HOSPITAL_BASED_OUTPATIENT_CLINIC_OR_DEPARTMENT_OTHER): Payer: 59 | Admitting: Oncology

## 2016-07-13 VITALS — BP 114/55 | HR 65 | Temp 98.4°F | Resp 18 | Ht 67.0 in | Wt 186.6 lb

## 2016-07-13 DIAGNOSIS — C4361 Malignant melanoma of right upper limb, including shoulder: Secondary | ICD-10-CM

## 2016-07-13 DIAGNOSIS — R1032 Left lower quadrant pain: Secondary | ICD-10-CM | POA: Diagnosis not present

## 2016-07-13 DIAGNOSIS — C8300 Small cell B-cell lymphoma, unspecified site: Secondary | ICD-10-CM

## 2016-07-13 DIAGNOSIS — E119 Type 2 diabetes mellitus without complications: Secondary | ICD-10-CM

## 2016-07-13 DIAGNOSIS — C9110 Chronic lymphocytic leukemia of B-cell type not having achieved remission: Secondary | ICD-10-CM

## 2016-07-13 DIAGNOSIS — G35 Multiple sclerosis: Secondary | ICD-10-CM

## 2016-07-13 LAB — TISSUE HYBRIDIZATION TO NCBH

## 2016-07-13 LAB — LACTATE DEHYDROGENASE: LDH: 169 U/L (ref 125–245)

## 2016-07-13 NOTE — Progress Notes (Signed)
  [facility] OFFICE PROGRESS NOTE   Diagnosis: Chronic lymphatic lymphoma  INTERVAL HISTORY:   [name] returns as scheduled. She feels well. No fever or night sweats. No palpable lymph nodes. No abdominal pain. She had a sore throat one week ago. This has resolved.  Objective:  Vital signs in last 24 hours:  Blood pressure (!) 114/55, pulse 65, temperature 98.4 F (36.9 C), temperature source Oral, resp. rate 18, height 5\' 7"  (1.702 m), weight 186 lb 9.6 oz (84.6 kg), SpO2 98 %.    HEENT: Neck without mass Lymphatics: No cervical, supra-clavicular, axillary, or inguinal nodes Resp: Lungs clear bilaterally Cardio: Regular rate and rhythm GI: No hepatosplenomegaly, no mass, nontender Vascular: No leg edema  Lab Results:  Lab Results  Component Value Date   WBC 5.0 06/20/2016   HGB 14.3 06/20/2016   HCT 43.6 06/20/2016   MCV 89.9 06/20/2016   PLT 168 06/20/2016   NEUTROABS 2.7 04/02/2010  LDH-169  Medications: I have reviewed the patient's current medications.  Assessment/Plan: 1. 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 Monoclonal Kappa restricted B-cell population identified on flow cytometry ? Staging PET scan 06/13/2016 confirmed hypermetabolic abdominal/pelvic lymphadenopathy ? FISH panel positive for 11q-,-12,13q-, and 17p-(10% of cells)  2. Stage IB (T1b,N0) melanoma of the right forearm, status post a wide excision and sentinel lymph node biopsy at [facility] March 2017  3.    Left lower abdominal pain-potentially related to abdominal/pelvic lymphadenopathy  4.    Multiple sclerosis  5.     Diabetes   Disposition:  Rachel Coffey has been diagnosed with small acidic lymphoma/CLL. She remains asymptomatic. The FISH panel is positive for a 17p-abnormality in a small percentage of cells. This may indicate the possibility of more rapid disease progression. We will consider Ibrutinib  therapy if she develops rapid or symptomatic progression.  She will return for an office and lab visit in 4 months. She will remain up-to-date only influenza vaccine. I will see her sooner as needed.  [name], MD  07/13/2016  11:34 AM

## 2016-07-15 ENCOUNTER — Telehealth: Payer: Self-pay | Admitting: *Deleted

## 2016-07-15 ENCOUNTER — Telehealth: Payer: Self-pay | Admitting: Oncology

## 2016-07-15 LAB — IMMUNOFIXATION ELECTROPHORESIS
IGA/IMMUNOGLOBULIN A, SERUM: 63 mg/dL — AB (ref 87–352)
IGG (IMMUNOGLOBIN G), SERUM: 631 mg/dL — AB (ref 700–1600)
IGM (IMMUNOGLOBIN M), SRM: 46 mg/dL (ref 26–217)
Total Protein: 6.4 g/dL (ref 6.0–8.5)

## 2016-07-15 NOTE — Telephone Encounter (Signed)
APPT CONF WITH PATIENT, PER 07/13/16 LOS.

## 2016-07-15 NOTE — Telephone Encounter (Signed)
-----   Message from Rachel Pier, MD sent at 07/15/2016  3:40 PM EDT ----- Please call patient, immunoglobulin levels are mildly low, f/u as scheduled,call for new symptoms

## 2016-07-15 NOTE — Telephone Encounter (Signed)
Called pt with lab results, per Dr. Gearldine Shown note below. She voiced understanding.

## 2016-07-19 ENCOUNTER — Encounter (HOSPITAL_COMMUNITY): Payer: Self-pay

## 2016-08-01 ENCOUNTER — Encounter (HOSPITAL_COMMUNITY): Payer: Self-pay

## 2016-08-03 ENCOUNTER — Encounter: Payer: Self-pay | Admitting: Oncology

## 2016-08-04 ENCOUNTER — Other Ambulatory Visit: Payer: Self-pay | Admitting: *Deleted

## 2016-08-19 ENCOUNTER — Encounter: Payer: Self-pay | Admitting: Oncology

## 2016-08-22 ENCOUNTER — Encounter: Payer: Self-pay | Admitting: Oncology

## 2016-11-10 ENCOUNTER — Telehealth: Payer: Self-pay | Admitting: *Deleted

## 2016-11-10 NOTE — Telephone Encounter (Signed)
Called pt to reschedule Lab/office visit to 1/30 at 3:00/3:30. She agreed to appt change. Message to schedulers.

## 2016-11-11 ENCOUNTER — Other Ambulatory Visit: Payer: 59

## 2016-11-11 ENCOUNTER — Ambulatory Visit: Payer: 59 | Admitting: Oncology

## 2016-12-13 ENCOUNTER — Other Ambulatory Visit (HOSPITAL_BASED_OUTPATIENT_CLINIC_OR_DEPARTMENT_OTHER): Payer: 59

## 2016-12-13 ENCOUNTER — Telehealth: Payer: Self-pay | Admitting: *Deleted

## 2016-12-13 ENCOUNTER — Telehealth: Payer: Self-pay | Admitting: Oncology

## 2016-12-13 ENCOUNTER — Ambulatory Visit (HOSPITAL_BASED_OUTPATIENT_CLINIC_OR_DEPARTMENT_OTHER): Payer: 59 | Admitting: Oncology

## 2016-12-13 VITALS — BP 106/59 | HR 80 | Temp 99.0°F | Resp 18 | Ht 67.0 in | Wt 187.1 lb

## 2016-12-13 DIAGNOSIS — G35 Multiple sclerosis: Secondary | ICD-10-CM

## 2016-12-13 DIAGNOSIS — C9110 Chronic lymphocytic leukemia of B-cell type not having achieved remission: Secondary | ICD-10-CM

## 2016-12-13 DIAGNOSIS — R109 Unspecified abdominal pain: Secondary | ICD-10-CM | POA: Diagnosis not present

## 2016-12-13 DIAGNOSIS — Z85820 Personal history of malignant melanoma of skin: Secondary | ICD-10-CM

## 2016-12-13 DIAGNOSIS — Z23 Encounter for immunization: Secondary | ICD-10-CM | POA: Diagnosis not present

## 2016-12-13 DIAGNOSIS — E119 Type 2 diabetes mellitus without complications: Secondary | ICD-10-CM | POA: Diagnosis not present

## 2016-12-13 LAB — CBC WITH DIFFERENTIAL/PLATELET
BASO%: 0.5 % (ref 0.0–2.0)
Basophils Absolute: 0 10*3/uL (ref 0.0–0.1)
EOS ABS: 0.1 10*3/uL (ref 0.0–0.5)
EOS%: 2.1 % (ref 0.0–7.0)
HCT: 42.4 % (ref 34.8–46.6)
HEMOGLOBIN: 14.1 g/dL (ref 11.6–15.9)
LYMPH%: 22.9 % (ref 14.0–49.7)
MCH: 29.2 pg (ref 25.1–34.0)
MCHC: 33.2 g/dL (ref 31.5–36.0)
MCV: 87.8 fL (ref 79.5–101.0)
MONO#: 0.5 10*3/uL (ref 0.1–0.9)
MONO%: 7.3 % (ref 0.0–14.0)
NEUT%: 67.2 % (ref 38.4–76.8)
NEUTROS ABS: 4.4 10*3/uL (ref 1.5–6.5)
Platelets: 201 10*3/uL (ref 145–400)
RBC: 4.83 10*6/uL (ref 3.70–5.45)
RDW: 12.8 % (ref 11.2–14.5)
WBC: 6.6 10*3/uL (ref 3.9–10.3)
lymph#: 1.5 10*3/uL (ref 0.9–3.3)

## 2016-12-13 MED ORDER — PNEUMOCOCCAL 13-VAL CONJ VACC IM SUSP
0.5000 mL | Freq: Once | INTRAMUSCULAR | Status: AC
  Administered 2016-12-13: 0.5 mL via INTRAMUSCULAR
  Filled 2016-12-13: qty 0.5

## 2016-12-13 NOTE — Telephone Encounter (Signed)
Appointments scheduled per 12/13/16 los. Patient was given a copy of the AVS report and appointment schedule per 12/13/16 los.

## 2016-12-13 NOTE — Telephone Encounter (Signed)
Left message on voicemail informing pt Dr. Benay Spice recommends scans before next visit. Requested she call back to discuss.

## 2016-12-13 NOTE — Progress Notes (Signed)
  [facility] OFFICE PROGRESS NOTE   Diagnosis: CLL  INTERVAL HISTORY:   Ms. Rachel Coffey returns as scheduled. She feels well. No fever, night sweats, anorexia, or palpable lymph nodes. She had a "sinus "infection in December that improved with antibiotics. She has noted a tight bandlike feeling at the upper abdomen and mild discomfort in the mid back for the past week. This is improved today. No numbness or weakness. No difficulty with bowel or bladder function.  Objective:  Vital signs in last 24 hours:  Blood pressure (!) 106/59, pulse 80, temperature 99 F (37.2 C), temperature source Oral, resp. rate 18, height '5\' 7"'$  (1.702 m), weight 187 lb 1.6 oz (84.9 kg), SpO2 100 %.    HEENT: Neck without mass Lymphatics: No cervical, supraclavicular, axillary, or left inguinal nodes. Soft 1 cm right inguinal node versus prominent fat tissue Resp: Lungs clear bilaterally Cardio: Regular rate and rhythm GI: No hepatosplenomegaly, no mass, nontender Vascular: No leg edema   Lab Results:  Lab Results  Component Value Date   WBC 6.6 12/13/2016   HGB 14.1 12/13/2016   HCT 42.4 12/13/2016   MCV 87.8 12/13/2016   PLT 201 12/13/2016   NEUTROABS 4.4 12/13/2016  Absolute lymphocyte count 1.5   Medications: I have reviewed the patient's current medications.  Assessment/Plan: 1. 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 Monoclonal Kappa restricted B-cell population identified onflow cytometry ? Staging PET scan 06/13/2016 confirmed hypermetabolic abdominal/pelvic lymphadenopathy ? FISH panel positive for 11q-,-12,13q-, and 17p-(10% of cells) ? Peripheral blood flow cytometry 09/22/2016 at [facility]-CD5/CD23 positive For restricted B-cell population, 2% of lymphocytes ? Peripheral blood IGH mutation analysis at [facility] 09/22/2016-unmutated  2. Stage IB (T1b,N0) melanoma of the right forearm, status post a wide excision and  sentinel lymph node biopsy at [facility] March 2017   3. Multiple sclerosis  4. Diabetes   Disposition: Ms. Spellman appears well. She is asymptomatic from the CLL. There is no indication for treatment at present. She will seek medical attention with her neurology if the "bandlike "discomfort at the upper abdomen does not resolve. She received a 13 valent pneumonia vaccine today. She will ask Dr. Sabra Heck to administer a 23 valent pneumonia vaccine when she sees her in approximately 3 months.  Ms. Miggins will return for an office visit in 6 months. We will see her sooner as needed. We discussed the indication for reimaging studies. We decided against restaging CTs for now. We will contact her and recommend a follow-up CT prior to the office visit in 6 months.      [name], MD  12/13/2016  4:39 PM

## 2016-12-14 ENCOUNTER — Other Ambulatory Visit: Payer: Self-pay | Admitting: *Deleted

## 2016-12-14 ENCOUNTER — Telehealth: Payer: Self-pay | Admitting: *Deleted

## 2016-12-14 DIAGNOSIS — C9110 Chronic lymphocytic leukemia of B-cell type not having achieved remission: Secondary | ICD-10-CM

## 2016-12-14 NOTE — Telephone Encounter (Signed)
Message left earlier today from patient stating that she was to f/u with her PCP in 3 months for 2nd pneumonia vaccine, but pt.'s PCP does the pneumonia injections one year after the first injection.  Per Dr. Benay Spice, patient can receive 2nd pneumonia vaccine at her visit to [facility] on 06/13/17.  Patient notified of Dr. Gearldine Shown instructions and has no further questions at this time.

## 2016-12-14 NOTE — Telephone Encounter (Signed)
Message received this AM from patient requesting call back from message received yesterday from the office.  Patient informed that Dr. Benay Spice recommends CT scans prior to her next visit.  Patient states that she is agreeable to scans.  Dr. Benay Spice notified.

## 2016-12-28 ENCOUNTER — Encounter: Payer: Self-pay | Admitting: Oncology

## 2017-01-18 ENCOUNTER — Encounter: Payer: Self-pay | Admitting: Oncology

## 2017-01-18 NOTE — Telephone Encounter (Signed)
Lm for pt advising referral would be made to [facility]. Lattie Haw is out of the office until Friday and would be returning calls on Friday. If she had any further questions or concerns to please send another my chart message or return call.

## 2017-01-20 ENCOUNTER — Encounter: Payer: Self-pay | Admitting: General Practice

## 2017-01-20 NOTE — Progress Notes (Signed)
[facility] Spiritual Care Note  LVM and encouraged call back per pt request via from [facility] Hansen/LPN.  Plan to introduce both Spiritual Care and Counseling as free resources available through [facility], following up as pt desires.   [place], [place], [facility] Pager [phone] Voicemail [phone]

## 2017-01-23 ENCOUNTER — Encounter: Payer: Self-pay | Admitting: General Practice

## 2017-01-23 NOTE — Progress Notes (Signed)
[facility] Spiritual Care Note  Rachel Coffey originally requested appt today, but has canceled due to weather.  LVM offering new appt times.  [place], [place], [facility] Pager [phone] Voicemail [phone]

## 2017-01-26 ENCOUNTER — Encounter: Payer: Self-pay | Admitting: General Practice

## 2017-01-26 ENCOUNTER — Encounter: Payer: Self-pay | Admitting: Oncology

## 2017-01-26 NOTE — Progress Notes (Signed)
[facility] Spiritual Care Note  Attempted to reach Denice Paradise again to reschedule.  LVM encouraging return call.   [place], [place], [facility] Pager [phone] Voicemail [phone]

## 2017-04-19 ENCOUNTER — Other Ambulatory Visit: Payer: Self-pay | Admitting: *Deleted

## 2017-04-27 ENCOUNTER — Encounter: Payer: Self-pay | Admitting: Oncology

## 2017-04-27 ENCOUNTER — Telehealth: Payer: Self-pay | Admitting: Oncology

## 2017-04-27 NOTE — Telephone Encounter (Signed)
confirmed 0800 lab on 7/24 per sch msg

## 2017-06-04 ENCOUNTER — Encounter: Payer: Self-pay | Admitting: Oncology

## 2017-06-06 ENCOUNTER — Other Ambulatory Visit (HOSPITAL_BASED_OUTPATIENT_CLINIC_OR_DEPARTMENT_OTHER): Payer: BLUE CROSS/BLUE SHIELD

## 2017-06-06 ENCOUNTER — Ambulatory Visit (HOSPITAL_COMMUNITY)
Admission: RE | Admit: 2017-06-06 | Discharge: 2017-06-06 | Disposition: A | Discharge status: Home / Self Care | Payer: BLUE CROSS/BLUE SHIELD | Source: Ambulatory Visit | Attending: Oncology | Admitting: Oncology

## 2017-06-06 ENCOUNTER — Encounter (HOSPITAL_COMMUNITY): Payer: Self-pay

## 2017-06-06 DIAGNOSIS — C9110 Chronic lymphocytic leukemia of B-cell type not having achieved remission: Secondary | ICD-10-CM

## 2017-06-06 DIAGNOSIS — C9190 Lymphoid leukemia, unspecified not having achieved remission: Secondary | ICD-10-CM | POA: Insufficient documentation

## 2017-06-06 DIAGNOSIS — Z23 Encounter for immunization: Secondary | ICD-10-CM

## 2017-06-06 DIAGNOSIS — I700 Atherosclerosis of aorta: Secondary | ICD-10-CM | POA: Insufficient documentation

## 2017-06-06 LAB — COMPREHENSIVE METABOLIC PANEL
ALT: 17 U/L (ref 0–55)
AST: 19 U/L (ref 5–34)
Albumin: 4.2 g/dL (ref 3.5–5.0)
Alkaline Phosphatase: 70 U/L (ref 40–150)
Anion Gap: 8 mEq/L (ref 3–11)
BUN: 18.7 mg/dL (ref 7.0–26.0)
CO2: 29 meq/L (ref 22–29)
Calcium: 10.7 mg/dL — ABNORMAL HIGH (ref 8.4–10.4)
Chloride: 103 mEq/L (ref 98–109)
Creatinine: 0.8 mg/dL (ref 0.6–1.1)
EGFR: 82 mL/min/{1.73_m2} — AB (ref >90)
GLUCOSE: 130 mg/dL (ref 70–140)
POTASSIUM: 4.9 meq/L (ref 3.5–5.1)
SODIUM: 140 meq/L (ref 136–145)
TOTAL PROTEIN: 6.9 g/dL (ref 6.4–8.3)
Total Bilirubin: 0.71 mg/dL (ref 0.20–1.20)

## 2017-06-06 LAB — CBC WITH DIFFERENTIAL/PLATELET
BASO%: 0.7 % (ref 0.0–2.0)
BASOS ABS: 0 10*3/uL (ref 0.0–0.1)
EOS%: 2.6 % (ref 0.0–7.0)
Eosinophils Absolute: 0.1 10*3/uL (ref 0.0–0.5)
HCT: 44.1 % (ref 34.8–46.6)
HEMOGLOBIN: 14.7 g/dL (ref 11.6–15.9)
LYMPH%: 22.9 % (ref 14.0–49.7)
MCH: 28.9 pg (ref 25.1–34.0)
MCHC: 33.3 g/dL (ref 31.5–36.0)
MCV: 86.6 fL (ref 79.5–101.0)
MONO#: 0.4 10*3/uL (ref 0.1–0.9)
MONO%: 10.2 % (ref 0.0–14.0)
NEUT#: 2.6 10*3/uL (ref 1.5–6.5)
NEUT%: 63.6 % (ref 38.4–76.8)
Platelets: 168 10*3/uL (ref 145–400)
RBC: 5.09 10*6/uL (ref 3.70–5.45)
RDW: 12.8 % (ref 11.2–14.5)
WBC: 4.1 10*3/uL (ref 3.9–10.3)
lymph#: 1 10*3/uL (ref 0.9–3.3)

## 2017-06-06 LAB — LACTATE DEHYDROGENASE: LDH: 164 U/L (ref 125–245)

## 2017-06-06 MED ORDER — IOPAMIDOL (ISOVUE-300) INJECTION 61%
100.0000 mL | Freq: Once | INTRAVENOUS | Status: AC | PRN
  Administered 2017-06-06: 100 mL via INTRAVENOUS

## 2017-06-06 MED ORDER — IOPAMIDOL (ISOVUE-300) INJECTION 61%
INTRAVENOUS | Status: AC
Start: 2017-06-06 — End: 2017-06-06
  Filled 2017-06-06: qty 100

## 2017-06-07 ENCOUNTER — Encounter: Payer: Self-pay | Admitting: Oncology

## 2017-06-07 ENCOUNTER — Ambulatory Visit (HOSPITAL_BASED_OUTPATIENT_CLINIC_OR_DEPARTMENT_OTHER): Payer: BLUE CROSS/BLUE SHIELD | Admitting: Oncology

## 2017-06-07 VITALS — BP 108/70 | HR 78 | Temp 98.5°F | Resp 18 | Ht 67.0 in | Wt 192.4 lb

## 2017-06-07 DIAGNOSIS — E119 Type 2 diabetes mellitus without complications: Secondary | ICD-10-CM

## 2017-06-07 DIAGNOSIS — C9110 Chronic lymphocytic leukemia of B-cell type not having achieved remission: Secondary | ICD-10-CM

## 2017-06-07 DIAGNOSIS — G35 Multiple sclerosis: Secondary | ICD-10-CM

## 2017-06-07 DIAGNOSIS — C4361 Malignant melanoma of right upper limb, including shoulder: Secondary | ICD-10-CM | POA: Diagnosis not present

## 2017-06-07 DIAGNOSIS — C8300 Small cell B-cell lymphoma, unspecified site: Secondary | ICD-10-CM | POA: Diagnosis not present

## 2017-06-07 NOTE — Progress Notes (Signed)
Rachel Coffey   Diagnosis: CLL  INTERVAL HISTORY:   Rachel Coffey returns as scheduled. She feels well. Good appetite. No fever or night sweats. No palpable lymph nodes.  Objective:  Vital signs in last 24 hours:  Blood pressure 108/70, pulse 78, temperature 98.5 F (36.9 C), temperature source Oral, resp. rate 18, height 5' 7"  (1.702 m), weight 192 lb 6.4 oz (87.3 kg), SpO2 97 %.    HEENT: Neck without mass Lymphatics: No cervical, supraclavicular, axillary, or inguinal nodes Resp: Lungs clear bilaterally Cardio: Regular rate and rhythm GI: No hepatosplenomegaly, no mass, nontender Vascular: No leg edema   Lab Results:  Lab Results  Component Value Date   WBC 4.1 06/06/2017   HGB 14.7 06/06/2017   HCT 44.1 06/06/2017   MCV 86.6 06/06/2017   PLT 168 06/06/2017   NEUTROABS 2.6 06/06/2017    CMP     Component Value Date/Time   NA 140 06/06/2017 0834   K 4.9 06/06/2017 0834   CL 103 02/19/2013 0935   CO2 29 06/06/2017 0834   GLUCOSE 130 06/06/2017 0834   BUN 18.7 06/06/2017 0834   CREATININE 0.8 06/06/2017 0834   CALCIUM 10.7 (H) 06/06/2017 0834   PROT 6.9 06/06/2017 0834   ALBUMIN 4.2 06/06/2017 0834   AST 19 06/06/2017 0834   ALT 17 06/06/2017 0834   ALKPHOS 70 06/06/2017 0834   BILITOT 0.71 06/06/2017 0834   GFRNONAA >90 02/19/2013 0935   GFRAA >90 02/19/2013 0935   LDH 164   Imaging:  Ct Chest W Contrast  Result Date: 06/06/2017 CLINICAL DATA:  Restaging chronic lymphocytic leukemia. No interval therapy. EXAM: CT CHEST, ABDOMEN, AND PELVIS WITH CONTRAST TECHNIQUE: Multidetector CT imaging of the chest, abdomen and pelvis was performed following the standard protocol during bolus administration of intravenous contrast. CONTRAST:  192m ISOVUE-300 IOPAMIDOL (ISOVUE-300) INJECTION 61% COMPARISON:  PET-CT 06/13/2016.  abdominopelvic CT 05/11/2016. FINDINGS: CT CHEST FINDINGS Cardiovascular: Stable mild atherosclerosis of  the aorta, great vessels and coronary arteries. No acute vascular findings are evident. The heart size is normal. There is no pericardial effusion. Mediastinum/Nodes: Several enlarged right retrocrural lymph nodes are again seen which were hypermetabolic on PET-CT. These include a 1.6 x 1.2 cm component on image 47 and a 2.1 x 1.3 cm component on image 55. These appear stable. No progressive mediastinal, hilar or axillary lymphadenopathy. The thyroid gland, trachea and esophagus demonstrate no significant findings. Lungs/Pleura: There is no pleural effusion. There are few scattered tiny pulmonary nodules bilaterally which are largely subpleural and likely benign. No enlarging nodule, airspace disease or endobronchial lesion. Musculoskeletal/Chest wall: No chest wall mass or suspicious osseous findings. CT ABDOMEN AND PELVIS FINDINGS Hepatobiliary: Stable scattered small hepatic cysts. There are no worrisome hepatic lesions. No biliary dilatation status post cholecystectomy. Pancreas: Unremarkable. No pancreatic ductal dilatation or surrounding inflammatory changes. Spleen: Normal in size without focal abnormality. Adrenals/Urinary Tract: Both adrenal glands appear normal. The kidneys appear normal without evidence of urinary tract calculus, suspicious lesion or hydronephrosis. No bladder abnormalities are seen. Stomach/Bowel: No evidence of bowel wall thickening, distention or surrounding inflammatory change. Stable mild distal colonic diverticulosis. The appendix appears normal. Vascular/Lymphatic: Aortic and branch vessel atherosclerosis. No evidence of large vessel occlusion or acute vascular finding. Again demonstrated are multiple enlarged lymph nodes within the porta hepatis, retroperitoneum and mesentery. Representative nodes include anterior celiac node measuring 4.6 x 3.7 cm on image 62 (previously 4.1 x 3.4 cm); a 2.6 x 2.1 cm  left periaortic node on image 74 (previously 2.4 x 2.1 cm); and a mesenteric  node in the false pelvis measuring 4.3 x 2.9 cm on image 96 (previously 6.4 x 4.3 cm). Reproductive: Hysterectomy.  No evidence of adnexal mass. Other: No evidence of abdominal wall mass or hernia. No ascites. Musculoskeletal: No acute or significant osseous findings. IMPRESSION: 1. Mixed response. Many of the previously demonstrated enlarged retrocrural, retroperitoneal and mesenteric lymph nodes are unchanged in size from previous CT of 13 months ago. Some of the nodes are slightly larger. Largest noted in the false pelvis is smaller. 2. The distribution of disease has not significantly changed. The spleen appears unremarkable. 3. No acute findings. 4.  Aortic Atherosclerosis (ICD10-I70.0). Electronically Signed   By: Richardean Sale M.D.   On: 06/06/2017 15:56   Ct Abdomen Pelvis W Contrast  Result Date: 06/06/2017 CLINICAL DATA:  Restaging chronic lymphocytic leukemia. No interval therapy. EXAM: CT CHEST, ABDOMEN, AND PELVIS WITH CONTRAST TECHNIQUE: Multidetector CT imaging of the chest, abdomen and pelvis was performed following the standard protocol during bolus administration of intravenous contrast. CONTRAST:  146m ISOVUE-300 IOPAMIDOL (ISOVUE-300) INJECTION 61% COMPARISON:  PET-CT 06/13/2016.  abdominopelvic CT 05/11/2016. FINDINGS: CT CHEST FINDINGS Cardiovascular: Stable mild atherosclerosis of the aorta, great vessels and coronary arteries. No acute vascular findings are evident. The heart size is normal. There is no pericardial effusion. Mediastinum/Nodes: Several enlarged right retrocrural lymph nodes are again seen which were hypermetabolic on PET-CT. These include a 1.6 x 1.2 cm component on image 47 and a 2.1 x 1.3 cm component on image 55. These appear stable. No progressive mediastinal, hilar or axillary lymphadenopathy. The thyroid gland, trachea and esophagus demonstrate no significant findings. Lungs/Pleura: There is no pleural effusion. There are few scattered tiny pulmonary nodules  bilaterally which are largely subpleural and likely benign. No enlarging nodule, airspace disease or endobronchial lesion. Musculoskeletal/Chest wall: No chest wall mass or suspicious osseous findings. CT ABDOMEN AND PELVIS FINDINGS Hepatobiliary: Stable scattered small hepatic cysts. There are no worrisome hepatic lesions. No biliary dilatation status post cholecystectomy. Pancreas: Unremarkable. No pancreatic ductal dilatation or surrounding inflammatory changes. Spleen: Normal in size without focal abnormality. Adrenals/Urinary Tract: Both adrenal glands appear normal. The kidneys appear normal without evidence of urinary tract calculus, suspicious lesion or hydronephrosis. No bladder abnormalities are seen. Stomach/Bowel: No evidence of bowel wall thickening, distention or surrounding inflammatory change. Stable mild distal colonic diverticulosis. The appendix appears normal. Vascular/Lymphatic: Aortic and branch vessel atherosclerosis. No evidence of large vessel occlusion or acute vascular finding. Again demonstrated are multiple enlarged lymph nodes within the porta hepatis, retroperitoneum and mesentery. Representative nodes include anterior celiac node measuring 4.6 x 3.7 cm on image 62 (previously 4.1 x 3.4 cm); a 2.6 x 2.1 cm left periaortic node on image 74 (previously 2.4 x 2.1 cm); and a mesenteric node in the false pelvis measuring 4.3 x 2.9 cm on image 96 (previously 6.4 x 4.3 cm). Reproductive: Hysterectomy.  No evidence of adnexal mass. Other: No evidence of abdominal wall mass or hernia. No ascites. Musculoskeletal: No acute or significant osseous findings. IMPRESSION: 1. Mixed response. Many of the previously demonstrated enlarged retrocrural, retroperitoneal and mesenteric lymph nodes are unchanged in size from previous CT of 13 months ago. Some of the nodes are slightly larger. Largest noted in the false pelvis is smaller. 2. The distribution of disease has not significantly changed. The spleen  appears unremarkable. 3. No acute findings. 4.  Aortic Atherosclerosis (ICD10-I70.0). Electronically Signed  By: Richardean Sale M.D.   On: 06/06/2017 15:56  CT images reviewed  Medications: I have reviewed the patient's current medications.  Assessment/Plan: 1. 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 Monoclonal Kappa restricted B-cell population identified onflow cytometry ? Staging PET scan 06/13/2016 confirmed hypermetabolic abdominal/pelvic lymphadenopathy ? FISHpanel positive for 11q-,-12,13q-, and 17p-(10% of cells) ? Peripheral blood flow cytometry 09/22/2016 at [id number]/[id number] positive For restricted B-cell population, 2% of lymphocytes ? Peripheral blood IGH mutation analysis at [facility] 09/22/2016-unmutated ? CT 06/06/2017-generally stable diffuse lymphadenopathy, node in the pelvis is smaller, some the lymph nodes are larger  2. Stage IB (T1b,N0) melanoma of the right forearm, status post a wide excision and sentinel lymph node biopsy at [facility] March 2017   3. Multiple sclerosis  4. Diabetes    Disposition:  Rachel Coffey appears stable. The restaging CTs reveal no significant change in the diffuse lymphadenopathy over the past year. She is asymptomatic from the low-grade lymphoma.  I discussed the prognosis and treatment options with Rachel Coffey and her family. I recommend continued observation since she is asymptomatic and there is no laboratory or x-ray evidence of significant disease progression. I suspect the mildly elevated calcium level is a benign finding. She will return for a repeat calcium level in 2 weeks.  Rachel Coffey will contact us for new symptoms. She will return for an office and lab visit in 6 months.  She remains at increased risk for infections. She will stay up-to-date on the influenza and pneumonia vaccines.  25 minutes were spent with the patient today. The majority of the time was  used for counseling and coordination of care.  Donneta Romberg, MD  06/07/2017  11:36 AM

## 2017-06-13 ENCOUNTER — Ambulatory Visit: Payer: 59

## 2017-06-13 ENCOUNTER — Other Ambulatory Visit: Payer: 59

## 2017-06-13 ENCOUNTER — Ambulatory Visit: Payer: 59 | Admitting: Oncology

## 2017-06-22 ENCOUNTER — Other Ambulatory Visit (HOSPITAL_BASED_OUTPATIENT_CLINIC_OR_DEPARTMENT_OTHER): Payer: BLUE CROSS/BLUE SHIELD

## 2017-06-22 DIAGNOSIS — C9110 Chronic lymphocytic leukemia of B-cell type not having achieved remission: Secondary | ICD-10-CM

## 2017-06-22 LAB — COMPREHENSIVE METABOLIC PANEL
ALT: 19 U/L (ref 0–55)
AST: 21 U/L (ref 5–34)
Albumin: 3.8 g/dL (ref 3.5–5.0)
Alkaline Phosphatase: 65 U/L (ref 40–150)
Anion Gap: 7 mEq/L (ref 3–11)
BUN: 16.7 mg/dL (ref 7.0–26.0)
CHLORIDE: 105 meq/L (ref 98–109)
CO2: 29 meq/L (ref 22–29)
Calcium: 9.9 mg/dL (ref 8.4–10.4)
Creatinine: 0.8 mg/dL (ref 0.6–1.1)
EGFR: 82 mL/min/{1.73_m2} — AB (ref >90)
GLUCOSE: 159 mg/dL — AB (ref 70–140)
POTASSIUM: 4.4 meq/L (ref 3.5–5.1)
SODIUM: 142 meq/L (ref 136–145)
Total Bilirubin: 0.6 mg/dL (ref 0.20–1.20)
Total Protein: 6.4 g/dL (ref 6.4–8.3)

## 2017-06-23 ENCOUNTER — Telehealth: Payer: Self-pay | Admitting: *Deleted

## 2017-06-23 NOTE — Telephone Encounter (Signed)
-----   Message from Ladell Pier, MD sent at 06/22/2017  5:30 PM EDT ----- Please call patient, calcium is normal, f/u as scheduled

## 2017-06-23 NOTE — Telephone Encounter (Signed)
Telephone call to patient advised lab results as directed below. Patient verbalized an understanding.

## 2017-06-30 ENCOUNTER — Telehealth: Payer: Self-pay | Admitting: Internal Medicine

## 2017-06-30 NOTE — Telephone Encounter (Signed)
Rec'd from Jackson Latino forwarded 21 pages to Scarlette Shorts MD

## 2017-08-22 ENCOUNTER — Encounter: Payer: Self-pay | Admitting: Oncology

## 2017-09-08 ENCOUNTER — Encounter: Payer: Self-pay | Admitting: Oncology

## 2017-12-04 ENCOUNTER — Other Ambulatory Visit: Payer: BLUE CROSS/BLUE SHIELD

## 2017-12-04 ENCOUNTER — Ambulatory Visit: Payer: BLUE CROSS/BLUE SHIELD | Admitting: Oncology

## 2017-12-08 ENCOUNTER — Inpatient Hospital Stay (HOSPITAL_BASED_OUTPATIENT_CLINIC_OR_DEPARTMENT_OTHER): Payer: BLUE CROSS/BLUE SHIELD | Admitting: Oncology

## 2017-12-08 ENCOUNTER — Inpatient Hospital Stay: Payer: BLUE CROSS/BLUE SHIELD | Attending: Oncology

## 2017-12-08 ENCOUNTER — Telehealth: Payer: Self-pay | Admitting: Oncology

## 2017-12-08 VITALS — BP 94/49 | HR 102 | Temp 98.3°F | Resp 18 | Ht 67.0 in | Wt 190.4 lb

## 2017-12-08 DIAGNOSIS — C9190 Lymphoid leukemia, unspecified not having achieved remission: Secondary | ICD-10-CM | POA: Diagnosis not present

## 2017-12-08 DIAGNOSIS — E119 Type 2 diabetes mellitus without complications: Secondary | ICD-10-CM

## 2017-12-08 DIAGNOSIS — G35 Multiple sclerosis: Secondary | ICD-10-CM | POA: Diagnosis not present

## 2017-12-08 DIAGNOSIS — C9110 Chronic lymphocytic leukemia of B-cell type not having achieved remission: Secondary | ICD-10-CM

## 2017-12-08 DIAGNOSIS — R079 Chest pain, unspecified: Secondary | ICD-10-CM | POA: Insufficient documentation

## 2017-12-08 LAB — CBC WITH DIFFERENTIAL/PLATELET
BASOS ABS: 0 10*3/uL (ref 0.0–0.1)
Basophils Relative: 1 %
EOS PCT: 4 %
Eosinophils Absolute: 0.2 10*3/uL (ref 0.0–0.5)
HEMATOCRIT: 42.7 % (ref 34.8–46.6)
Hemoglobin: 14.1 g/dL (ref 11.6–15.9)
LYMPHS ABS: 1 10*3/uL (ref 0.9–3.3)
LYMPHS PCT: 22 %
MCH: 28.5 pg (ref 25.1–34.0)
MCHC: 33 g/dL (ref 31.5–36.0)
MCV: 86.2 fL (ref 79.5–101.0)
MONOS PCT: 9 %
Monocytes Absolute: 0.4 10*3/uL (ref 0.1–0.9)
NEUTROS ABS: 3.1 10*3/uL (ref 1.5–6.5)
Neutrophils Relative %: 64 %
PLATELETS: 159 10*3/uL (ref 145–400)
RBC: 4.96 MIL/uL (ref 3.70–5.45)
RDW: 12.7 % (ref 11.2–16.1)
WBC: 4.7 10*3/uL (ref 3.9–10.3)

## 2017-12-08 LAB — COMPREHENSIVE METABOLIC PANEL
ALBUMIN: 4.1 g/dL (ref 3.5–5.0)
ALK PHOS: 74 U/L (ref 40–150)
ALT: 15 U/L (ref 0–55)
ANION GAP: 9 (ref 3–11)
AST: 17 U/L (ref 5–34)
BUN: 18 mg/dL (ref 7–26)
CO2: 29 mmol/L (ref 22–29)
Calcium: 9.7 mg/dL (ref 8.4–10.4)
Chloride: 100 mmol/L (ref 98–109)
Creatinine, Ser: 0.84 mg/dL (ref 0.60–1.10)
GFR calc Af Amer: >60 mL/min (ref >60)
GFR calc non Af Amer: >60 mL/min (ref >60)
GLUCOSE: 167 mg/dL — AB (ref 70–140)
POTASSIUM: 4.8 mmol/L — AB (ref 3.3–4.7)
SODIUM: 138 mmol/L (ref 136–145)
TOTAL PROTEIN: 6.8 g/dL (ref 6.4–8.3)
Total Bilirubin: 0.5 mg/dL (ref 0.2–1.2)

## 2017-12-08 LAB — LACTATE DEHYDROGENASE: LDH: 164 U/L (ref 125–245)

## 2017-12-08 NOTE — Progress Notes (Signed)
  [facility] OFFICE PROGRESS NOTE   Diagnosis: CLL  INTERVAL HISTORY:   Rachel Coffey returns as scheduled.  She feels well.  Fever or night sweats.  No palpable lymph nodes.  She had an upper respiratory infection in December.  She reports increased "bandlike "tightness and aching at the mid chest.  She relates this to multiple sclerosis.  The pain is better with position change.  Objective:  Vital signs in last 24 hours:  Blood pressure (!) 94/49, pulse (!) 102, temperature 98.3 F (36.8 C), temperature source Oral, resp. rate 18, height '5\' 7"'$  (1.702 m), weight 190 lb 6.4 oz (86.4 kg), SpO2 100 %.    HEENT: Neck without mass Lymphatics: No cervical, supraclavicular, axillary, or inguinal nodes Resp: Lungs clear bilaterally Cardio: Regular rate and rhythm GI: No hepatosplenomegaly, no mass, nontender Vascular: No leg edema  Skin: Right arm scar without evidence of recurrent tumor   Lab Results:  Lab Results  Component Value Date   WBC 4.7 12/08/2017   HGB 14.1 12/08/2017   HCT 42.7 12/08/2017   MCV 86.2 12/08/2017   PLT 159 12/08/2017   NEUTROABS 3.1 12/08/2017     No results found.  Medications: I have reviewed the patient's current medications.   Assessment/Plan:  1. 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 Monoclonal Kappa restricted B-cell population identified onflow cytometry ? Staging PET scan 06/13/2016 confirmed hypermetabolic abdominal/pelvic lymphadenopathy ? FISHpanel positive for 11q-,-12,13q-, and 17p-(10% of cells) ? Peripheral blood flow cytometry 09/22/2016 at [facility]-[id number]/CD23 positive For restricted B-cell population, 2% of lymphocytes ? Peripheral blood IGH mutation analysis at [facility] 09/22/2016-unmutated ? CT 06/06/2017-generally stable diffuse lymphadenopathy, node in the pelvis is smaller, some the lymph nodes are larger  2. Stage IB (T1b,N0) melanoma of the right forearm,  status post a wide excision and sentinel lymph node biopsy at [facility] March 2017   3. Multiple sclerosis  4. Diabetes   Disposition: Rachel Coffey appears asymptomatic from the low-grade lymphoma.  She will return for an office visit and restaging CT evaluation in approximately 6 months.  She will contact us in the interim for new symptoms. I doubt the chest discomfort is related to lymphoma.  She plans to follow-up with her neurologist.    15 minutes were spent with the patient today.  The majority of the time was used for counseling and coordination of care.  Rachel Coder, MD  12/08/2017  9:07 AM

## 2017-12-08 NOTE — Telephone Encounter (Signed)
Scheduled appt per 1/25 los - Gave patient AVS - Central radiology to contact patient with ct schedule.

## 2017-12-29 NOTE — Progress Notes (Signed)
Faxed 1/25 labs to Dr. Ammie Ferrier office

## 2018-04-04 ENCOUNTER — Telehealth: Payer: Self-pay

## 2018-04-04 NOTE — Telephone Encounter (Signed)
Called to provide Central Scheduling number to patient. [phone]. LVM with information

## 2018-05-14 ENCOUNTER — Encounter: Payer: Self-pay | Admitting: Oncology

## 2018-05-17 IMAGING — DX DG HAND COMPLETE 3+V*R*
3 series · 3 of 3 positions shown · non-contrast
Comparison: None.

CLINICAL DATA: Pain in right hand and wrist after fall.

EXAM:
RIGHT HAND - COMPLETE 3+ VIEW

[hand pa]
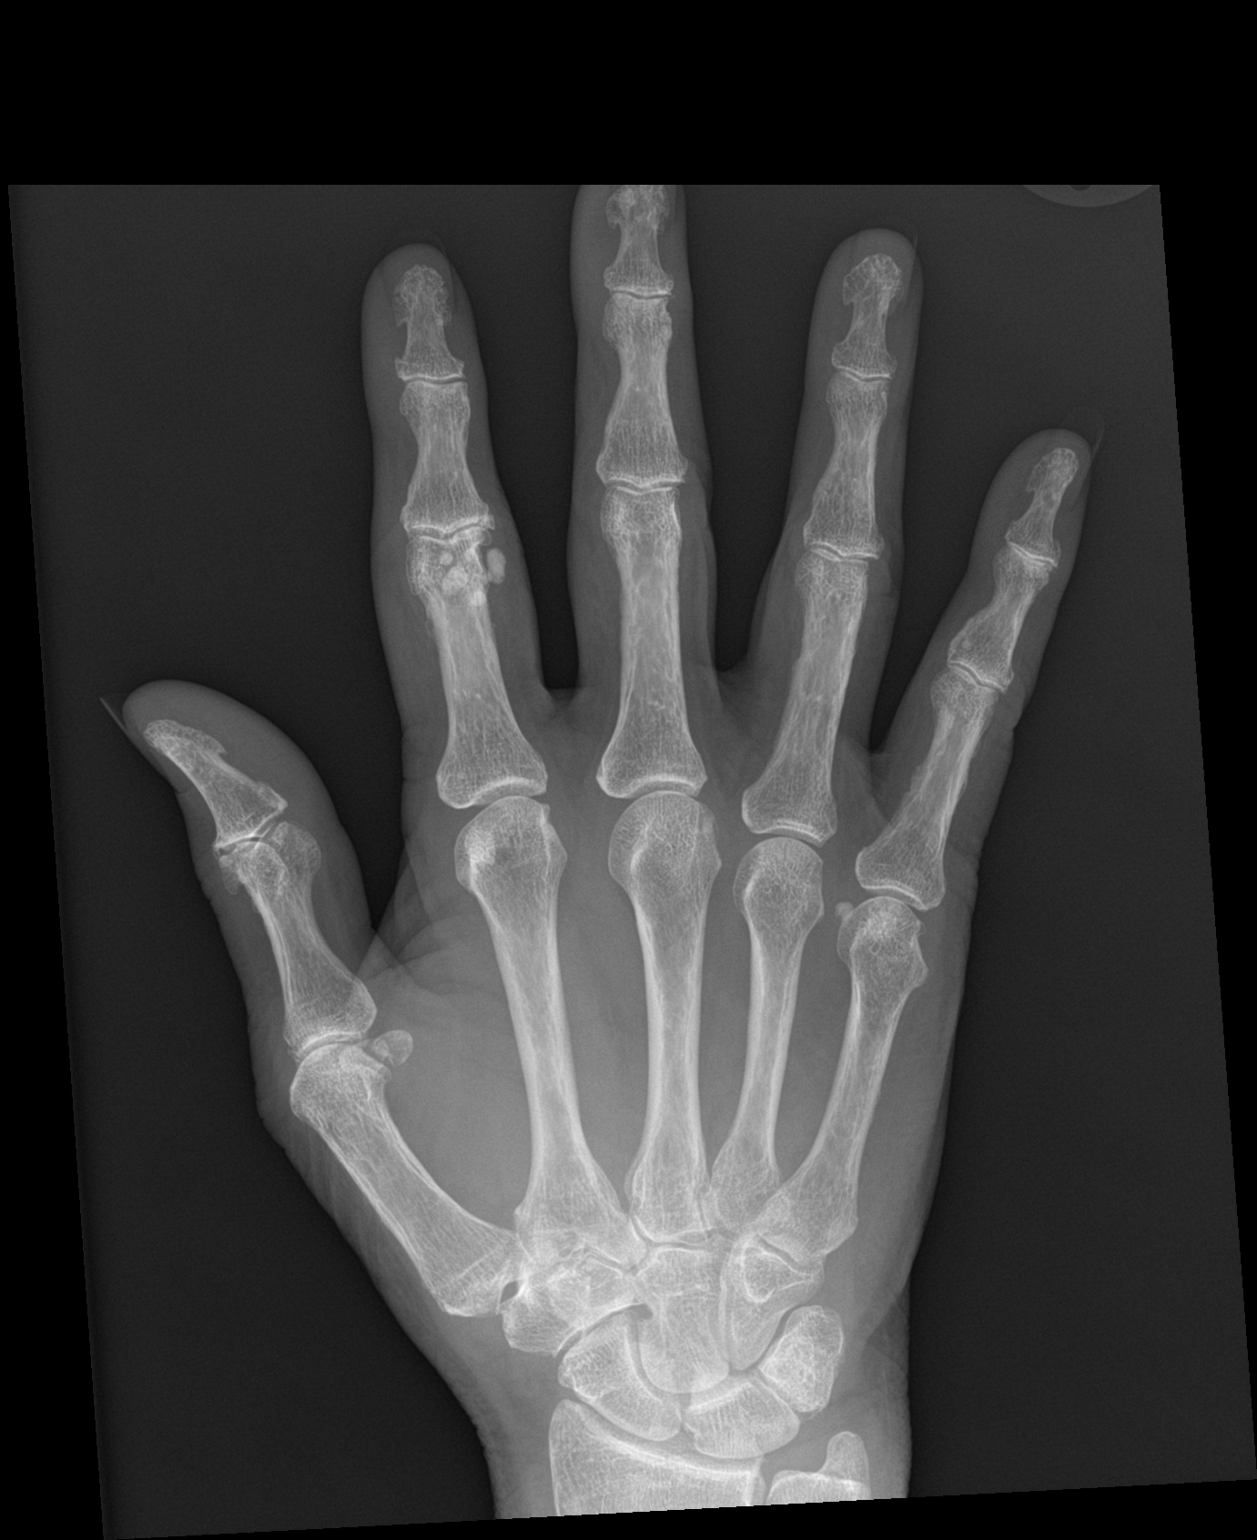

[hand obl]
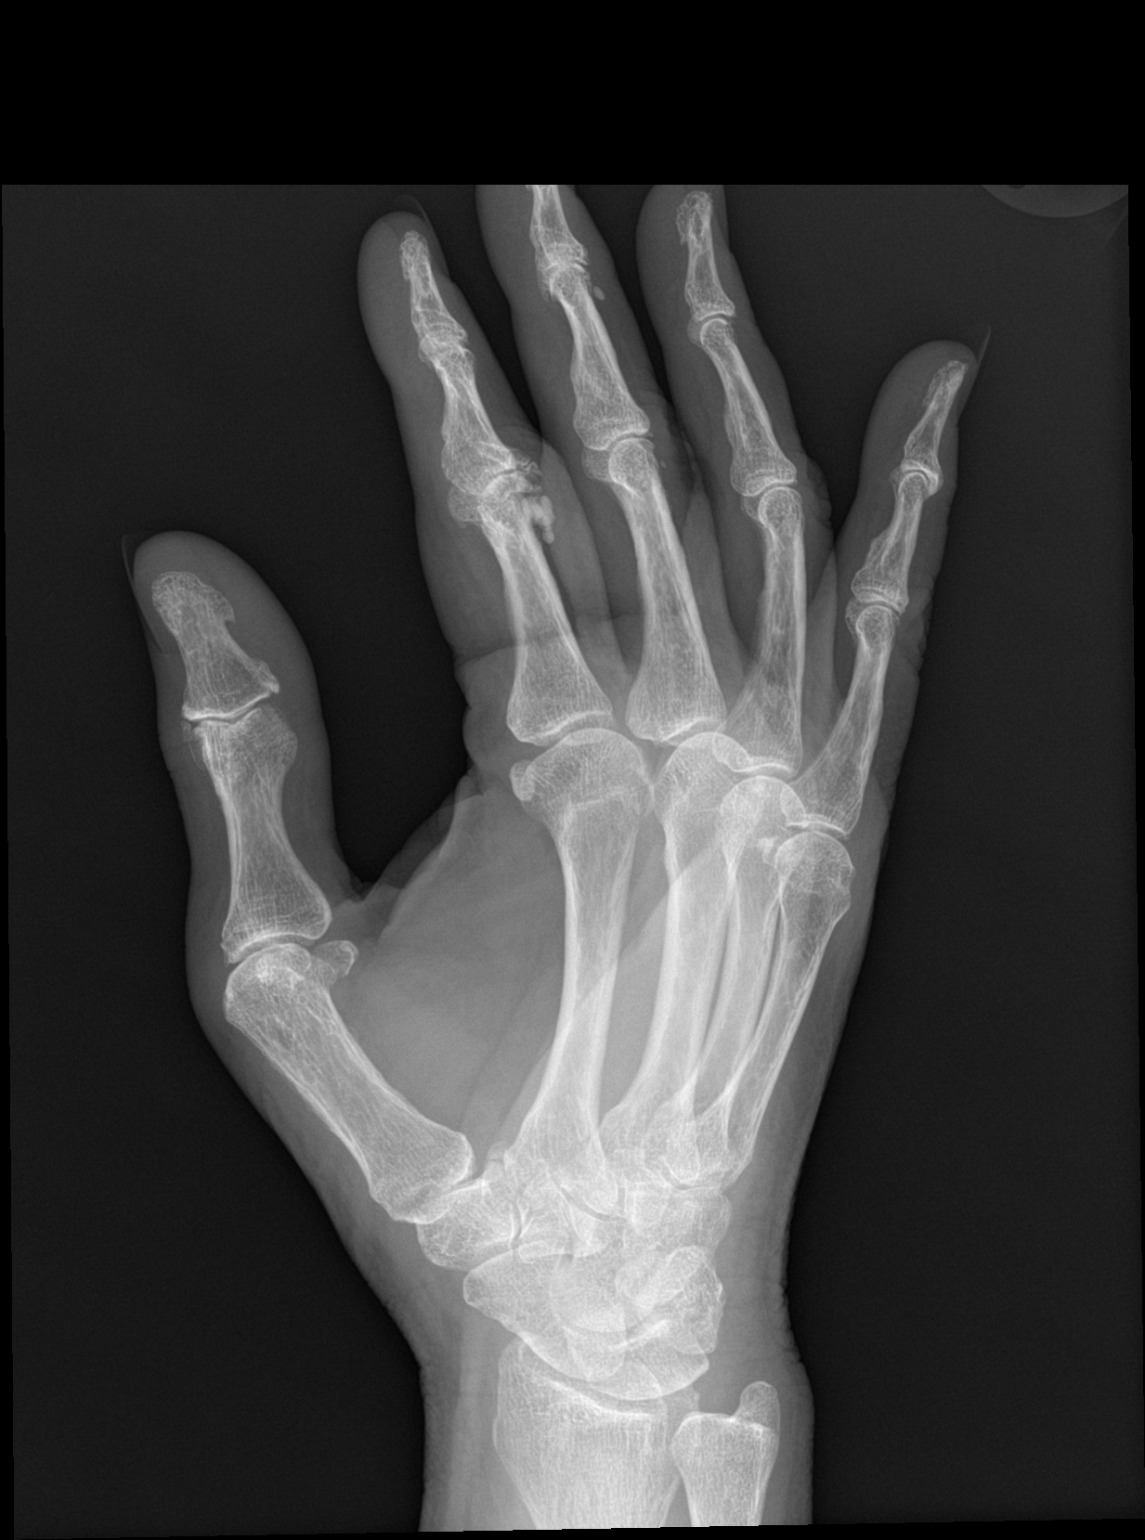

[hand lat]
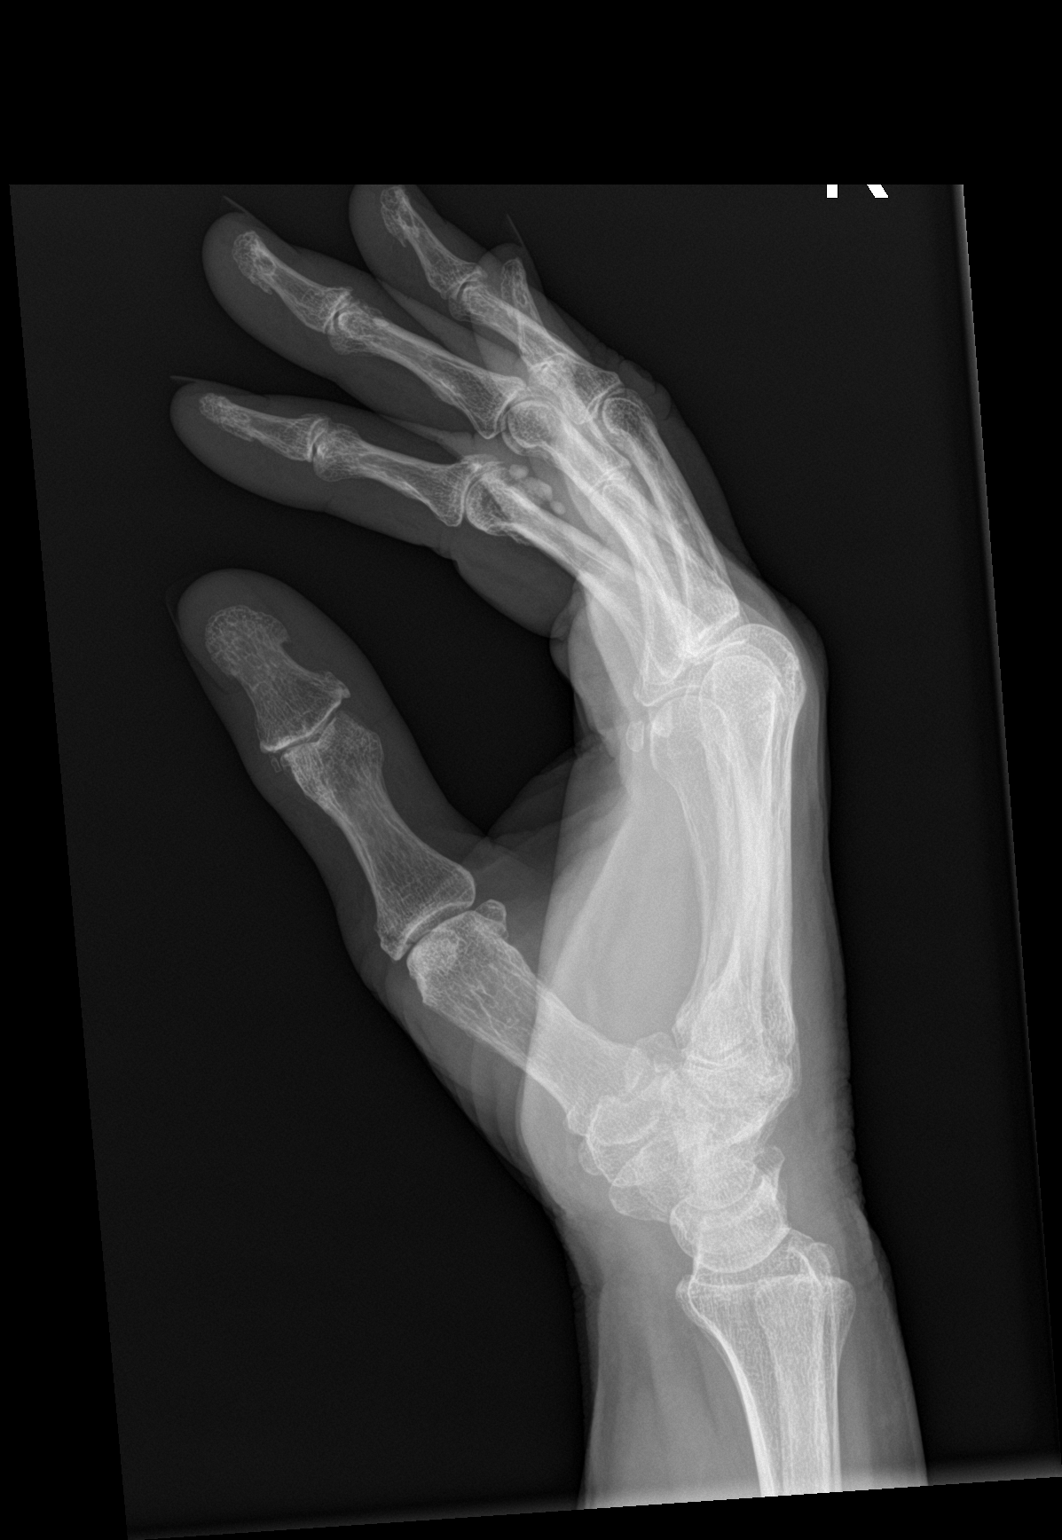

[3 of 3 positions shown; findings below may reference images not displayed]

FINDINGS: Deformity of the distal aspect of the proximal second phalanx has a
chronic appearance, likely from prior trauma. There are degenerative
changes is well. No acute fractures.
IMPRESSION: No acute fractures.

## 2018-05-20 IMAGING — CT CT ABD-PELV W/ CM
2 of 5 series · 16 of 46 positions shown, 18 images · IV contrast (APPLIED)
Comparison: None.

CLINICAL DATA: Left lower quadrant pain 5 weeks. Previous
hysterectomy and cholecystectomy. Newly diagnosed melanoma right arm
post resection.

EXAM:
CT ABDOMEN AND PELVIS WITH CONTRAST
TECHNIQUE: Multidetector CT imaging of the abdomen and pelvis was performed
using the standard protocol following bolus administration of
intravenous contrast.
CONTRAST:  100mL GEOLNY-J33 IOPAMIDOL (GEOLNY-J33) INJECTION 61%

[Series 2: abd/pelvis w/cm · axial · 0.74mm/px · z∈[-561,-166]mm · 13 of 89 slices shown, 15 images]
[im 5/89  soft-tissue]
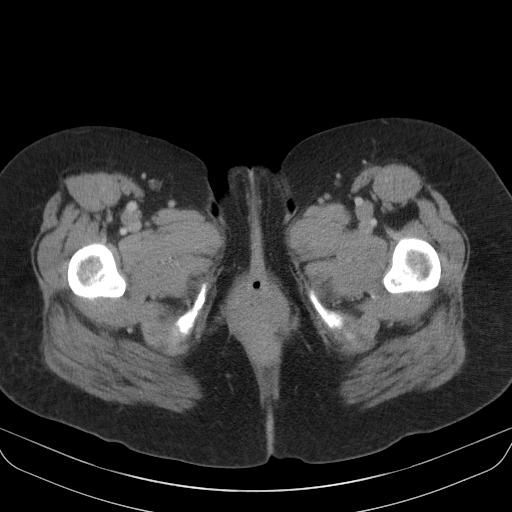
[im 5/89  bone]
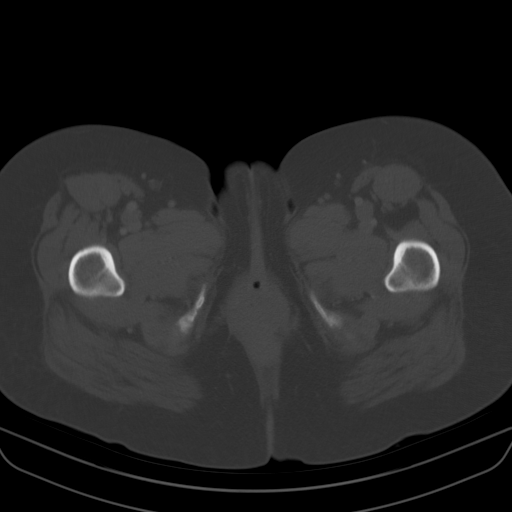
[im 14/89  soft-tissue]
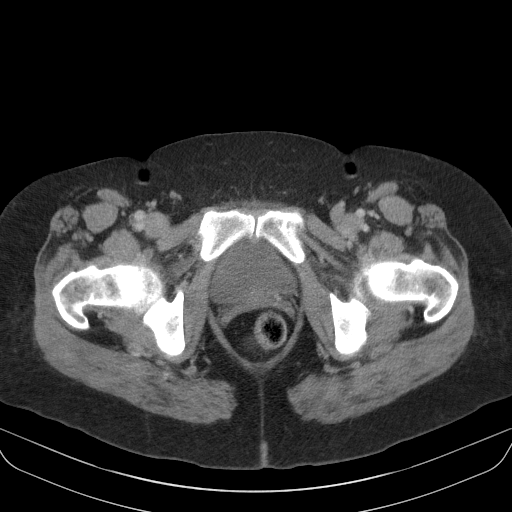
[im 19/89  soft-tissue]
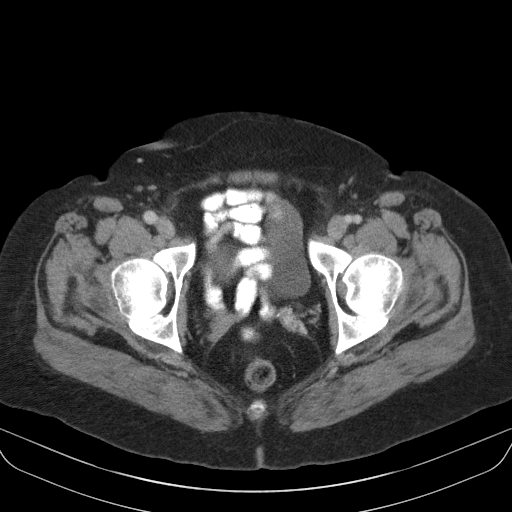
[im 24/89  soft-tissue]
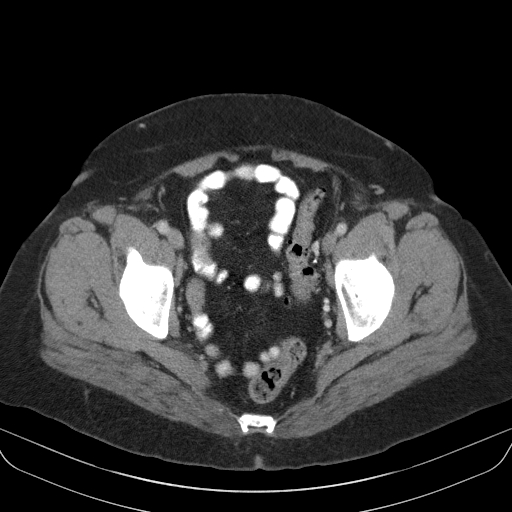
[im 33/89  soft-tissue]
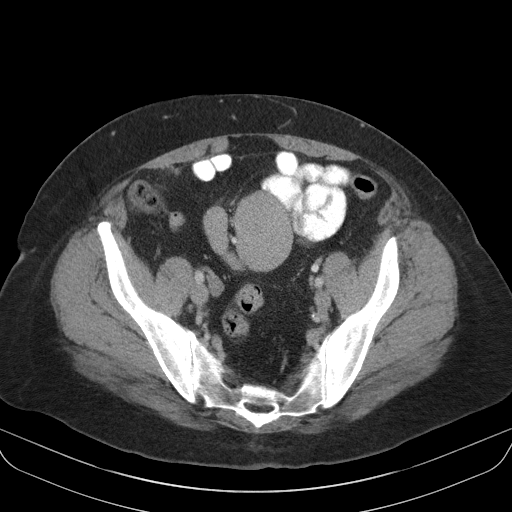
[im 38/89  soft-tissue]
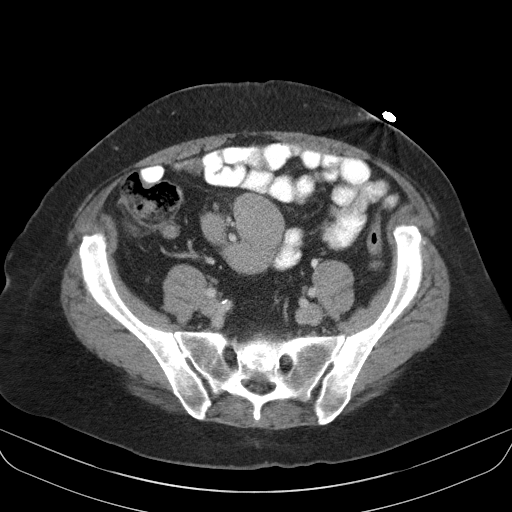
[im 47/89  soft-tissue]
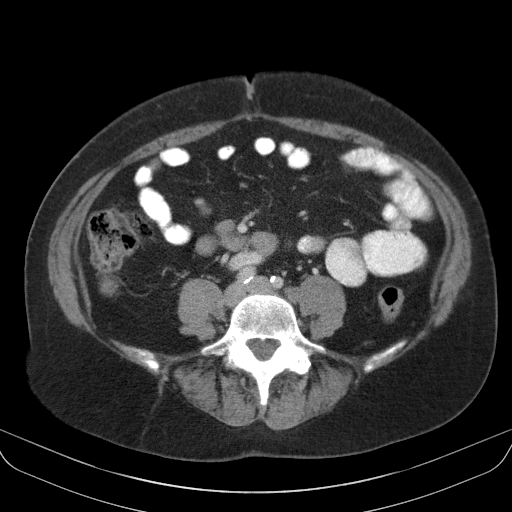
[im 51/89  soft-tissue]
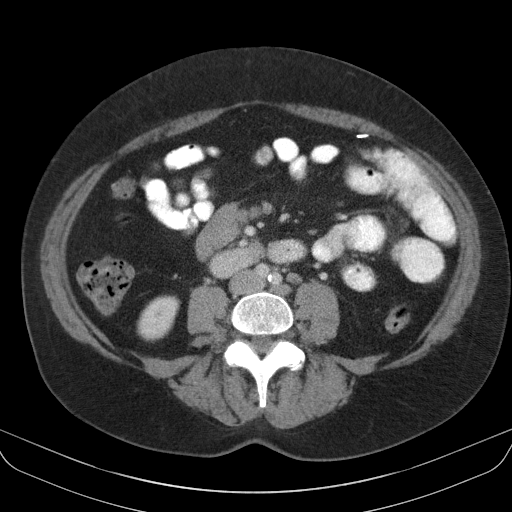
[im 56/89  soft-tissue]
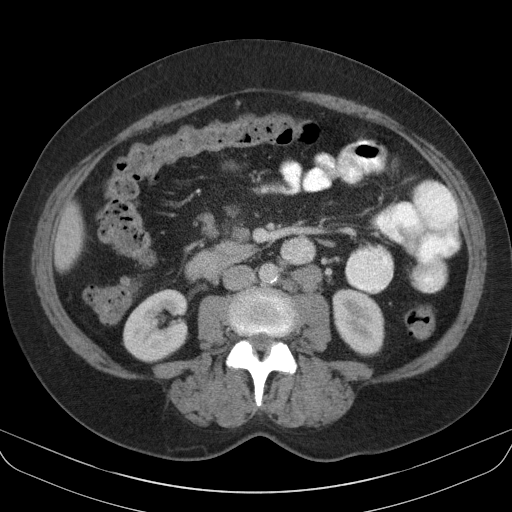
[im 56/89  bone]
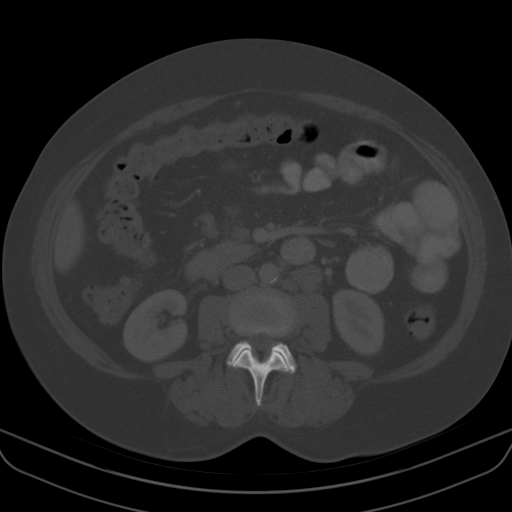
[im 65/89  soft-tissue]
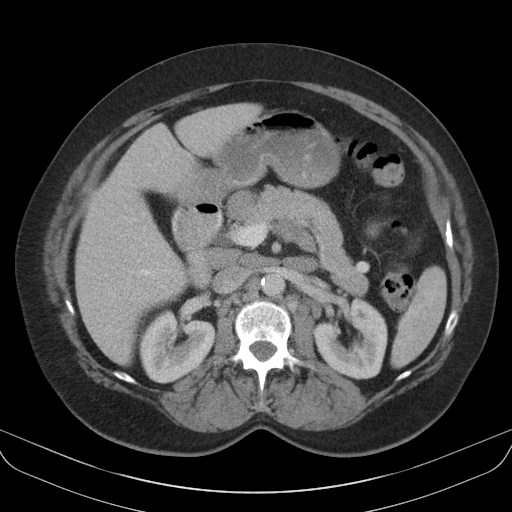
[im 70/89  soft-tissue]
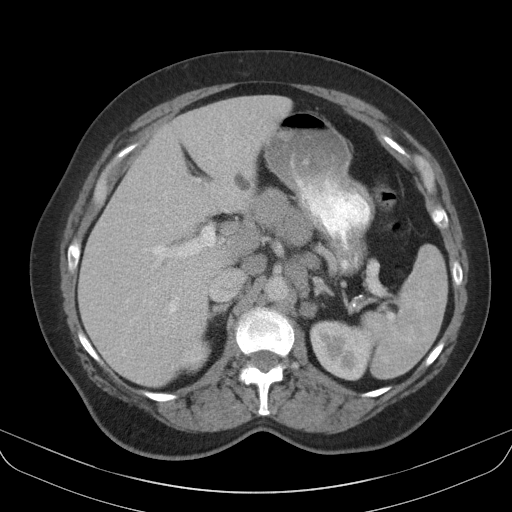
[im 75/89  soft-tissue]
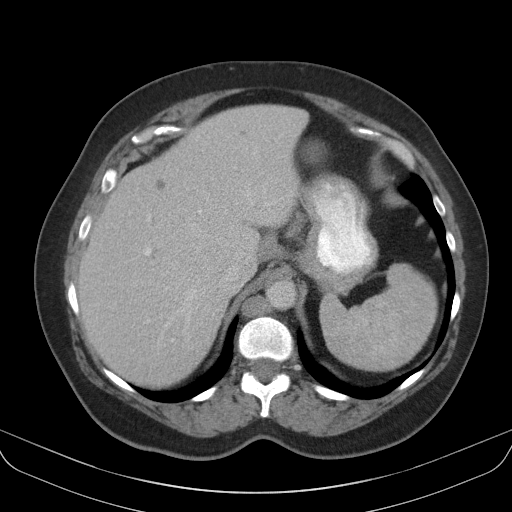
[im 84/89  soft-tissue]
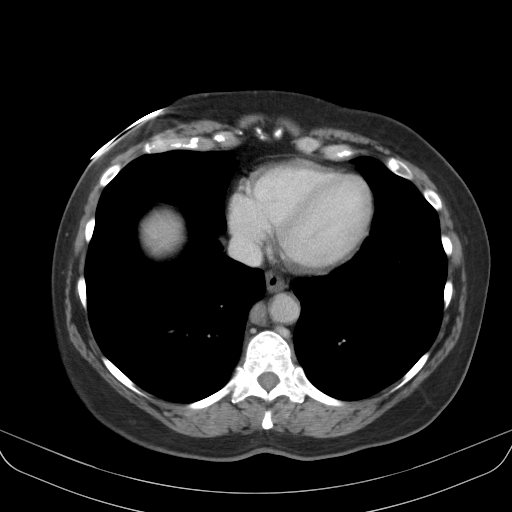

[Series 3: cor · coronal · 0.72mm/px · 3 of 93 slices shown]
[im 31/93  soft-tissue]
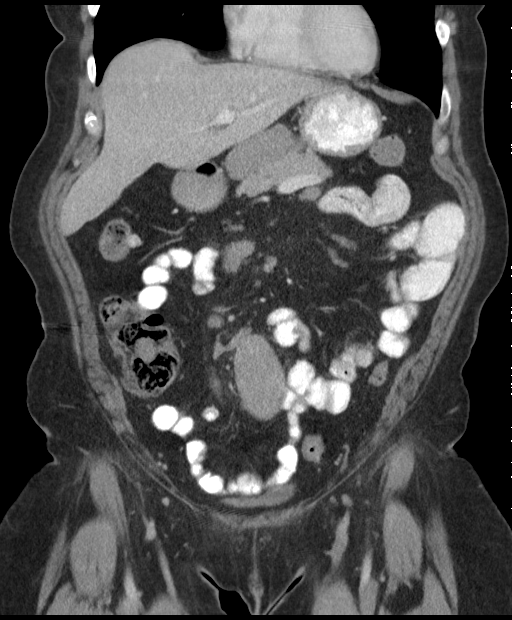
[im 41/93  soft-tissue]
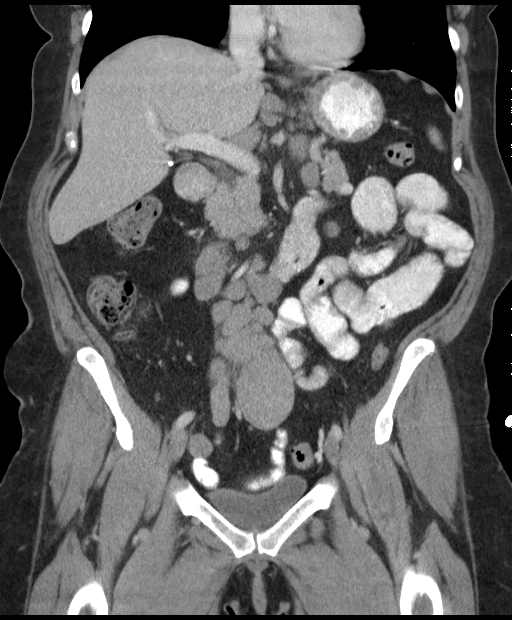
[im 52/93  soft-tissue]
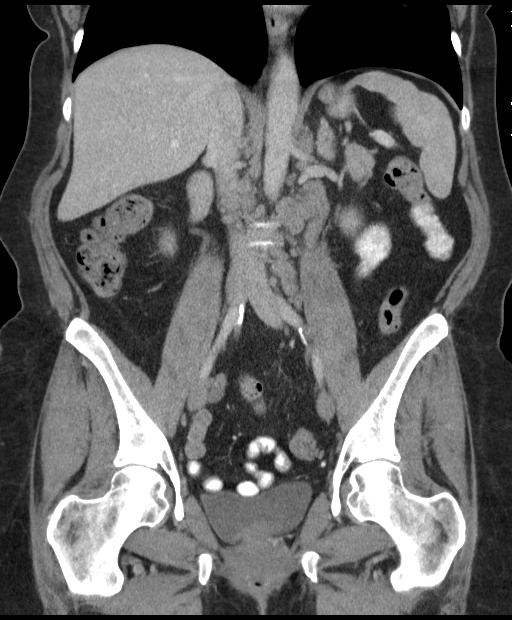

[16 of 46 positions shown; findings below may reference images not displayed]

FINDINGS: Lower chest: Lung bases are within normal. Right retrocrural
adenopathy.

Hepatobiliary: There are a few small scattered liver cysts. Evidence
of previous cholecystectomy.

Pancreas: No mass, inflammatory changes, or other significant
abnormality.

Spleen: Within normal limits in size and appearance.

Adrenals/Urinary Tract: No masses identified. No evidence of
hydronephrosis.

Stomach/Bowel: No evidence of obstruction, inflammatory process, or
abnormal fluid collections. Normal appendix. Minimal diverticulosis
of the colon.

Vascular/Lymphatic: There is significant adenopathy over the
gastrohepatic ligament, portacaval and periaortic region as well as
within the mesentery and along the iliac chains. Anterior celiac
axis lymph node measures 3.4 x 4.1 cm. Lower midline mesenteric
lymph node measures 4.3 x 6 cm. Minimal calcified plaque over the
abdominal aorta and iliac arteries.

Reproductive: Previous hysterectomy. Adnexal regions are within
normal.

Other: None.

Musculoskeletal: Small focal sclerotic density over the left
inferior pubic ramus likely a bone island, although cannot exclude
metastatic disease.
IMPRESSION: No acute findings in the abdomen/pelvis.

Extensive adenopathy as described within the right retrocrural
region, abdomen/retroperitoneum and pelvis. Largest node over the
mesentery of the midline lower abdomen measuring 4.3 x 6 cm.
Findings may be due to metastatic disease in this patient with newly
diagnosed melanoma. Other neoplastic processes such as
lymphoma/leukemia should be considered.

Few small liver cysts.

Minimal diverticulosis of the colon.

Solitary sclerotic focus over the left inferior pubic ramus likely a
bone island, although cannot exclude metastatic disease.

## 2018-05-21 ENCOUNTER — Encounter: Payer: Self-pay | Admitting: Oncology

## 2018-05-23 NOTE — Telephone Encounter (Signed)
Please call her, the antibiotics should not effect the restaging CTs, ok to wait another month on CTs if she would like to recover from the ear infection

## 2018-05-24 ENCOUNTER — Telehealth: Payer: Self-pay

## 2018-05-24 DIAGNOSIS — H9202 Otalgia, left ear: Secondary | ICD-10-CM | POA: Insufficient documentation

## 2018-05-24 NOTE — Telephone Encounter (Signed)
In response to MyChart, pt ok to proceed as scheduled with CT scan despite recent infection and current antibiotic usage per Dr. Benay Spice. Pt voiced understanding.

## 2018-05-30 ENCOUNTER — Telehealth: Payer: Self-pay | Admitting: Oncology

## 2018-05-30 NOTE — Telephone Encounter (Signed)
Patient left a message about rescheduling and MD said it was ok for 8/20

## 2018-05-31 ENCOUNTER — Other Ambulatory Visit: Payer: BLUE CROSS/BLUE SHIELD

## 2018-05-31 ENCOUNTER — Ambulatory Visit (HOSPITAL_COMMUNITY): Payer: BLUE CROSS/BLUE SHIELD

## 2018-05-31 DIAGNOSIS — H9122 Sudden idiopathic hearing loss, left ear: Secondary | ICD-10-CM | POA: Insufficient documentation

## 2018-05-31 DIAGNOSIS — M26629 Arthralgia of temporomandibular joint, unspecified side: Secondary | ICD-10-CM | POA: Insufficient documentation

## 2018-06-07 ENCOUNTER — Ambulatory Visit: Payer: BLUE CROSS/BLUE SHIELD | Admitting: Oncology

## 2018-06-19 DIAGNOSIS — R198 Other specified symptoms and signs involving the digestive system and abdomen: Secondary | ICD-10-CM | POA: Insufficient documentation

## 2018-06-19 DIAGNOSIS — R0989 Other specified symptoms and signs involving the circulatory and respiratory systems: Secondary | ICD-10-CM | POA: Insufficient documentation

## 2018-07-02 ENCOUNTER — Ambulatory Visit (HOSPITAL_COMMUNITY)
Admission: RE | Admit: 2018-07-02 | Discharge: 2018-07-02 | Disposition: A | Discharge status: Home / Self Care | Payer: BLUE CROSS/BLUE SHIELD | Source: Ambulatory Visit | Attending: Oncology | Admitting: Oncology

## 2018-07-02 ENCOUNTER — Inpatient Hospital Stay: Payer: BLUE CROSS/BLUE SHIELD | Attending: Oncology

## 2018-07-02 ENCOUNTER — Encounter (HOSPITAL_COMMUNITY): Payer: Self-pay

## 2018-07-02 DIAGNOSIS — R918 Other nonspecific abnormal finding of lung field: Secondary | ICD-10-CM | POA: Insufficient documentation

## 2018-07-02 DIAGNOSIS — R911 Solitary pulmonary nodule: Secondary | ICD-10-CM | POA: Insufficient documentation

## 2018-07-02 DIAGNOSIS — Z85820 Personal history of malignant melanoma of skin: Secondary | ICD-10-CM | POA: Insufficient documentation

## 2018-07-02 DIAGNOSIS — R490 Dysphonia: Secondary | ICD-10-CM | POA: Insufficient documentation

## 2018-07-02 DIAGNOSIS — G35 Multiple sclerosis: Secondary | ICD-10-CM | POA: Insufficient documentation

## 2018-07-02 DIAGNOSIS — C9190 Lymphoid leukemia, unspecified not having achieved remission: Secondary | ICD-10-CM | POA: Insufficient documentation

## 2018-07-02 DIAGNOSIS — C9110 Chronic lymphocytic leukemia of B-cell type not having achieved remission: Secondary | ICD-10-CM

## 2018-07-02 DIAGNOSIS — Z87891 Personal history of nicotine dependence: Secondary | ICD-10-CM | POA: Insufficient documentation

## 2018-07-02 DIAGNOSIS — R590 Localized enlarged lymph nodes: Secondary | ICD-10-CM | POA: Diagnosis not present

## 2018-07-02 DIAGNOSIS — K769 Liver disease, unspecified: Secondary | ICD-10-CM | POA: Insufficient documentation

## 2018-07-02 DIAGNOSIS — E119 Type 2 diabetes mellitus without complications: Secondary | ICD-10-CM | POA: Diagnosis not present

## 2018-07-02 LAB — CBC WITH DIFFERENTIAL (CANCER CENTER ONLY)
Basophils Absolute: 0 10*3/uL (ref 0.0–0.1)
Basophils Relative: 1 %
EOS ABS: 0.1 10*3/uL (ref 0.0–0.5)
Eosinophils Relative: 1 %
HCT: 41 % (ref 34.8–46.6)
HEMOGLOBIN: 13.8 g/dL (ref 11.6–15.9)
Lymphocytes Relative: 19 %
Lymphs Abs: 1 10*3/uL (ref 0.9–3.3)
MCH: 28.9 pg (ref 25.1–34.0)
MCHC: 33.6 g/dL (ref 31.5–36.0)
MCV: 86.1 fL (ref 79.5–101.0)
MONOS PCT: 7 %
Monocytes Absolute: 0.4 10*3/uL (ref 0.1–0.9)
NEUTROS PCT: 72 %
Neutro Abs: 3.8 10*3/uL (ref 1.5–6.5)
Platelet Count: 207 10*3/uL (ref 145–400)
RBC: 4.76 MIL/uL (ref 3.70–5.45)
RDW: 14.2 % (ref 11.2–14.5)
WBC: 5.3 10*3/uL (ref 3.9–10.3)

## 2018-07-02 LAB — BASIC METABOLIC PANEL - CANCER CENTER ONLY
Anion gap: 9 (ref 5–15)
BUN: 9 mg/dL (ref 8–23)
CO2: 30 mmol/L (ref 22–32)
CREATININE: 0.76 mg/dL (ref 0.44–1.00)
Calcium: 9.9 mg/dL (ref 8.9–10.3)
Chloride: 103 mmol/L (ref 98–111)
Glucose, Bld: 133 mg/dL — ABNORMAL HIGH (ref 70–99)
Potassium: 4.3 mmol/L (ref 3.5–5.1)
SODIUM: 142 mmol/L (ref 135–145)

## 2018-07-02 MED ORDER — IOHEXOL 300 MG/ML  SOLN
100.0000 mL | Freq: Once | INTRAMUSCULAR | Status: AC | PRN
  Administered 2018-07-02: 100 mL via INTRAVENOUS

## 2018-07-03 ENCOUNTER — Telehealth: Payer: Self-pay | Admitting: Oncology

## 2018-07-03 ENCOUNTER — Inpatient Hospital Stay (HOSPITAL_BASED_OUTPATIENT_CLINIC_OR_DEPARTMENT_OTHER): Payer: BLUE CROSS/BLUE SHIELD | Admitting: Oncology

## 2018-07-03 VITALS — BP 120/58 | HR 83 | Temp 98.5°F | Resp 18 | Ht 67.0 in | Wt 175.9 lb

## 2018-07-03 DIAGNOSIS — K769 Liver disease, unspecified: Secondary | ICD-10-CM | POA: Diagnosis not present

## 2018-07-03 DIAGNOSIS — R490 Dysphonia: Secondary | ICD-10-CM | POA: Diagnosis not present

## 2018-07-03 DIAGNOSIS — G35 Multiple sclerosis: Secondary | ICD-10-CM

## 2018-07-03 DIAGNOSIS — Z85820 Personal history of malignant melanoma of skin: Secondary | ICD-10-CM

## 2018-07-03 DIAGNOSIS — R918 Other nonspecific abnormal finding of lung field: Secondary | ICD-10-CM | POA: Diagnosis not present

## 2018-07-03 DIAGNOSIS — Z87891 Personal history of nicotine dependence: Secondary | ICD-10-CM

## 2018-07-03 DIAGNOSIS — E119 Type 2 diabetes mellitus without complications: Secondary | ICD-10-CM

## 2018-07-03 DIAGNOSIS — C9110 Chronic lymphocytic leukemia of B-cell type not having achieved remission: Secondary | ICD-10-CM

## 2018-07-03 DIAGNOSIS — C9190 Lymphoid leukemia, unspecified not having achieved remission: Secondary | ICD-10-CM | POA: Diagnosis not present

## 2018-07-03 NOTE — Progress Notes (Signed)
[facility] OFFICE PROGRESS NOTE   Diagnosis: CLL  INTERVAL HISTORY:   Rachel Coffey returns for a scheduled visit.  She reports developing a fever, sore throat, and left "ear "discomfort beginning 05/15/2018.  She was treated with doxycycline which caused nausea.  This was followed by a course of "prednisone ".  She reports the sore throat and ear pain have resolved, but she has persistent soreness near the left TMJ.  She also has hoarseness.  She was evaluated by Dr. Redmond Baseman.  She was noted to have left-sided hearing loss.  She lost weight when she was ill in July.  She reports intentional weight loss prior to her illness.  Her weight has now stabilized.  Objective:  Vital signs in last 24 hours:  Blood pressure (!) 120/58, pulse 83, temperature 98.5 F (36.9 C), temperature source Oral, resp. rate 18, height 5' 7"  (1.702 m), weight 175 lb 14.4 oz (79.8 kg), SpO2 98 %.    HEENT: Oropharynx without thrush, erythema, or exudate, neck without mass, mild tenderness at the left TMJ Lymphatics: No cervical, supraclavicular, axillary, or inguinal nodes Resp: Lungs clear bilaterally Cardio: Regular rate and rhythm GI: No mass, no hepatospleno megaly, nontender Vascular: Leg edema      Lab Results:  Lab Results  Component Value Date   WBC 5.3 07/02/2018   HGB 13.8 07/02/2018   HCT 41.0 07/02/2018   MCV 86.1 07/02/2018   PLT 207 07/02/2018   NEUTROABS 3.8 07/02/2018    CMP  Lab Results  Component Value Date   NA 142 07/02/2018   K 4.3 07/02/2018   CL 103 07/02/2018   CO2 30 07/02/2018   GLUCOSE 133 (H) 07/02/2018   BUN 9 07/02/2018   CREATININE 0.76 07/02/2018   CALCIUM 9.9 07/02/2018   PROT 6.8 12/08/2017   ALBUMIN 4.1 12/08/2017   AST 17 12/08/2017   ALT 15 12/08/2017   ALKPHOS 74 12/08/2017   BILITOT 0.5 12/08/2017   GFRNONAA >60 07/02/2018   GFRAA >60 07/02/2018    Imaging:  Ct Chest W Contrast  Result Date: 07/02/2018 CLINICAL DATA:  Patient  with history lymphoma and. Recent weight loss. EXAM: CT CHEST, ABDOMEN, AND PELVIS WITH CONTRAST TECHNIQUE: Multidetector CT imaging of the chest, abdomen and pelvis was performed following the standard protocol during bolus administration of intravenous contrast. CONTRAST:  167m OMNIPAQUE IOHEXOL 300 MG/ML  SOLN COMPARISON:  CT CAP 06/06/2017 FINDINGS: CT CHEST FINDINGS Cardiovascular: Normal heart size. No pericardial effusion. Aorta and main pulmonary artery normal in caliber. Thoracic aortic vascular calcifications. Mediastinum/Nodes: Interval decrease in size right retrocrural lymph node measuring 1.0 cm (image 47; series 2) previously 1 cm. Additional right retrocrural lymph node decreased in size measuring 1.0 cm (image 54; series 2), previously 1 2 cm. Normal appearance of the esophagus. Lungs/Pleura: Central airways are patent. Dependent atelectasis. Interval development of a 6 mm left upper lobe nodule (image 41; series 4). Additional tiny peripheral nodules are similar when compared to prior exam. No large area of pulmonary consolidation. No pleural effusion or pneumothorax. Musculoskeletal: Thoracic spine degenerative changes. No aggressive acute appearing osseous lesions. CT ABDOMEN PELVIS FINDINGS Hepatobiliary: Multiple small hepatic cysts are similar when compared to prior. Fatty deposition adjacent to falciform ligament. Gallbladder surgically absent. Pancreas: Unremarkable Spleen: Unremarkable Adrenals/Urinary Tract: Adrenal glands are normal. Kidneys enhance symmetrically with contrast. No hydronephrosis. Urinary bladder is unremarkable. Stomach/Bowel: Sigmoid colonic diverticulosis. No CT evidence for acute diverticulitis. No evidence for small bowel obstruction. No free fluid  or free intraperitoneal air. Normal morphology of the stomach. Slight interval decrease in size upper abdominal mesenteric node adjacent to the stomach measuring 6.3 x 2.0 cm (image 61; series 2), previously 6.6 x 2.1 cm.  Slight interval decrease in size of left periaortic node measuring 2.7 x 2.2 cm (image 70; series 2), previously 3.1 x 2.2 cm. Interval decrease in size of small bowel mesenteric lymph node measuring 2.9 x 1.3 cm (image 78; series 2), previously 3.8 x 2.1 cm. Vascular/Lymphatic: Normal caliber abdominal aorta. Peripheral calcified atherosclerotic plaque. Reproductive: Status post hysterectomy. Other: None. Musculoskeletal: No aggressive or acute appearing osseous lesions. Lumbar spine degenerative changes. IMPRESSION: 1. Slight interval decrease in size of retrocrural, retroperitoneal and mesenteric adenopathy. 2. Interval development of a 6 mm left upper lobe pulmonary nodule, indeterminate. Non-contrast chest CT at 3-6 months is recommended. If the nodules are stable at time of repeat CT, then future CT at 18-24 months (from today's scan) is considered optional for low-risk patients, but is recommended for high-risk patients. This recommendation follows the consensus statement: Guidelines for Management of Incidental Pulmonary Nodules Detected on CT Images: From the Fleischner Society 2017; Radiology 2017; 284:228-243. 3. No acute process. Electronically Signed   By: Lovey Newcomer M.D.   On: 07/02/2018 13:23   Ct Abdomen Pelvis W Contrast  Result Date: 07/02/2018 CLINICAL DATA:  Patient with history lymphoma and. Recent weight loss. EXAM: CT CHEST, ABDOMEN, AND PELVIS WITH CONTRAST TECHNIQUE: Multidetector CT imaging of the chest, abdomen and pelvis was performed following the standard protocol during bolus administration of intravenous contrast. CONTRAST:  156m OMNIPAQUE IOHEXOL 300 MG/ML  SOLN COMPARISON:  CT CAP 06/06/2017 FINDINGS: CT CHEST FINDINGS Cardiovascular: Normal heart size. No pericardial effusion. Aorta and main pulmonary artery normal in caliber. Thoracic aortic vascular calcifications. Mediastinum/Nodes: Interval decrease in size right retrocrural lymph node measuring 1.0 cm (image 47; series  2) previously 1 cm. Additional right retrocrural lymph node decreased in size measuring 1.0 cm (image 54; series 2), previously 1 2 cm. Normal appearance of the esophagus. Lungs/Pleura: Central airways are patent. Dependent atelectasis. Interval development of a 6 mm left upper lobe nodule (image 41; series 4). Additional tiny peripheral nodules are similar when compared to prior exam. No large area of pulmonary consolidation. No pleural effusion or pneumothorax. Musculoskeletal: Thoracic spine degenerative changes. No aggressive acute appearing osseous lesions. CT ABDOMEN PELVIS FINDINGS Hepatobiliary: Multiple small hepatic cysts are similar when compared to prior. Fatty deposition adjacent to falciform ligament. Gallbladder surgically absent. Pancreas: Unremarkable Spleen: Unremarkable Adrenals/Urinary Tract: Adrenal glands are normal. Kidneys enhance symmetrically with contrast. No hydronephrosis. Urinary bladder is unremarkable. Stomach/Bowel: Sigmoid colonic diverticulosis. No CT evidence for acute diverticulitis. No evidence for small bowel obstruction. No free fluid or free intraperitoneal air. Normal morphology of the stomach. Slight interval decrease in size upper abdominal mesenteric node adjacent to the stomach measuring 6.3 x 2.0 cm (image 61; series 2), previously 6.6 x 2.1 cm. Slight interval decrease in size of left periaortic node measuring 2.7 x 2.2 cm (image 70; series 2), previously 3.1 x 2.2 cm. Interval decrease in size of small bowel mesenteric lymph node measuring 2.9 x 1.3 cm (image 78; series 2), previously 3.8 x 2.1 cm. Vascular/Lymphatic: Normal caliber abdominal aorta. Peripheral calcified atherosclerotic plaque. Reproductive: Status post hysterectomy. Other: None. Musculoskeletal: No aggressive or acute appearing osseous lesions. Lumbar spine degenerative changes. IMPRESSION: 1. Slight interval decrease in size of retrocrural, retroperitoneal and mesenteric adenopathy. 2. Interval  development of a 6  mm left upper lobe pulmonary nodule, indeterminate. Non-contrast chest CT at 3-6 months is recommended. If the nodules are stable at time of repeat CT, then future CT at 18-24 months (from today's scan) is considered optional for low-risk patients, but is recommended for high-risk patients. This recommendation follows the consensus statement: Guidelines for Management of Incidental Pulmonary Nodules Detected on CT Images: From the Fleischner Society 2017; Radiology 2017; 284:228-243. 3. No acute process. Electronically Signed   By: Lovey Newcomer M.D.   On: 07/02/2018 13:23    Medications: I have reviewed the patient's current medications.   Assessment/Plan: 1. 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 Monoclonal Kappa restricted B-cell population identified onflow cytometry ? Staging PET scan 06/13/2016 confirmed hypermetabolic abdominal/pelvic lymphadenopathy ? FISHpanel positive for 11q-,-12,13q-, and 17p-(10% of cells) ? Peripheral blood flow cytometry 09/22/2016 at [id number] positive For restricted B-cell population, 2% of lymphocytes ? Peripheral blood IGH mutation analysis at [facility] 09/22/2016-unmutated ? CT 06/06/2017-generally stable diffuse lymphadenopathy, node in the pelvis is smaller, some the lymph nodes are larger ? CTs 07/02/2018- slight decrease in size of retrocrural, retroperitoneal, and mesenteric adenopathy, new 6 mm left upper lobe nodule  2. Stage IB (T1b,N0) melanoma of the right forearm, status post a wide excision and sentinel lymph node biopsy at [facility] March 2017   3. Multiple sclerosis  4. Diabetes   Disposition: Rachel Coffey appears stable from a hematologic standpoint.  There is no clinical evidence for progression of lymphoma.  I reviewed the CT images with Rachel Coffey and her family.  The plan is to continue observation for the CLL.  The slight decrease in lymph node size  compared to last year may be related to the recent course of prednisone.  I suspect she had a viral upper respiratory infection in July.  She will continue follow-up with Dr. Redmond Baseman and her primary physician if the hoarseness persists.  The lung nodule is most likely a benign finding.  She has a remote history of smoking.  We will schedule a six-month noncontrast chest CT to follow-up on the lung nodule.  Rachel Coffey will remain up-to-date on the influenza and pneumonia vaccines.  She will return for an office visit after the repeat CT in 6 months.  [name], MD  07/03/2018  2:15 PM

## 2018-07-03 NOTE — Telephone Encounter (Signed)
Appts scheduled AVS/Calendar printed per 8/20 los °

## 2018-07-05 DIAGNOSIS — R490 Dysphonia: Secondary | ICD-10-CM | POA: Insufficient documentation

## 2018-08-10 DIAGNOSIS — L821 Other seborrheic keratosis: Secondary | ICD-10-CM | POA: Diagnosis not present

## 2018-08-10 DIAGNOSIS — D0461 Carcinoma in situ of skin of right upper limb, including shoulder: Secondary | ICD-10-CM | POA: Diagnosis not present

## 2018-08-10 DIAGNOSIS — D485 Neoplasm of uncertain behavior of skin: Secondary | ICD-10-CM | POA: Diagnosis not present

## 2018-08-13 ENCOUNTER — Encounter: Payer: Self-pay | Admitting: Oncology

## 2018-08-13 DIAGNOSIS — Z23 Encounter for immunization: Secondary | ICD-10-CM | POA: Diagnosis not present

## 2018-08-15 DIAGNOSIS — Z23 Encounter for immunization: Secondary | ICD-10-CM | POA: Diagnosis not present

## 2018-08-15 DIAGNOSIS — R490 Dysphonia: Secondary | ICD-10-CM | POA: Diagnosis not present

## 2018-08-15 DIAGNOSIS — Z6826 Body mass index (BMI) 26.0-26.9, adult: Secondary | ICD-10-CM | POA: Diagnosis not present

## 2018-08-15 DIAGNOSIS — E785 Hyperlipidemia, unspecified: Secondary | ICD-10-CM | POA: Diagnosis not present

## 2018-08-15 DIAGNOSIS — E1169 Type 2 diabetes mellitus with other specified complication: Secondary | ICD-10-CM | POA: Diagnosis not present

## 2018-08-20 DIAGNOSIS — Z85820 Personal history of malignant melanoma of skin: Secondary | ICD-10-CM | POA: Diagnosis not present

## 2018-08-20 DIAGNOSIS — Z23 Encounter for immunization: Secondary | ICD-10-CM | POA: Diagnosis not present

## 2018-08-20 DIAGNOSIS — D0461 Carcinoma in situ of skin of right upper limb, including shoulder: Secondary | ICD-10-CM | POA: Diagnosis not present

## 2018-08-20 DIAGNOSIS — D225 Melanocytic nevi of trunk: Secondary | ICD-10-CM | POA: Diagnosis not present

## 2018-08-20 DIAGNOSIS — Z808 Family history of malignant neoplasm of other organs or systems: Secondary | ICD-10-CM | POA: Diagnosis not present

## 2018-08-20 DIAGNOSIS — L821 Other seborrheic keratosis: Secondary | ICD-10-CM | POA: Diagnosis not present

## 2018-08-20 DIAGNOSIS — L910 Hypertrophic scar: Secondary | ICD-10-CM | POA: Diagnosis not present

## 2018-09-17 DIAGNOSIS — H9193 Unspecified hearing loss, bilateral: Secondary | ICD-10-CM | POA: Diagnosis not present

## 2018-09-17 DIAGNOSIS — R490 Dysphonia: Secondary | ICD-10-CM | POA: Diagnosis not present

## 2018-09-17 DIAGNOSIS — H90A21 Sensorineural hearing loss, unilateral, right ear, with restricted hearing on the contralateral side: Secondary | ICD-10-CM | POA: Diagnosis not present

## 2018-09-17 DIAGNOSIS — H90A32 Mixed conductive and sensorineural hearing loss, unilateral, left ear with restricted hearing on the contralateral side: Secondary | ICD-10-CM | POA: Diagnosis not present

## 2018-09-17 DIAGNOSIS — H9122 Sudden idiopathic hearing loss, left ear: Secondary | ICD-10-CM | POA: Diagnosis not present

## 2018-09-25 DIAGNOSIS — H52223 Regular astigmatism, bilateral: Secondary | ICD-10-CM | POA: Diagnosis not present

## 2018-09-25 DIAGNOSIS — H5203 Hypermetropia, bilateral: Secondary | ICD-10-CM | POA: Diagnosis not present

## 2018-09-25 DIAGNOSIS — G35 Multiple sclerosis: Secondary | ICD-10-CM | POA: Diagnosis not present

## 2018-09-25 DIAGNOSIS — H2513 Age-related nuclear cataract, bilateral: Secondary | ICD-10-CM | POA: Diagnosis not present

## 2018-09-25 DIAGNOSIS — H524 Presbyopia: Secondary | ICD-10-CM | POA: Diagnosis not present

## 2018-10-31 DIAGNOSIS — J069 Acute upper respiratory infection, unspecified: Secondary | ICD-10-CM | POA: Diagnosis not present

## 2018-10-31 DIAGNOSIS — J209 Acute bronchitis, unspecified: Secondary | ICD-10-CM | POA: Diagnosis not present

## 2018-12-04 DIAGNOSIS — E1169 Type 2 diabetes mellitus with other specified complication: Secondary | ICD-10-CM | POA: Diagnosis not present

## 2018-12-04 DIAGNOSIS — Z6826 Body mass index (BMI) 26.0-26.9, adult: Secondary | ICD-10-CM | POA: Diagnosis not present

## 2018-12-06 ENCOUNTER — Telehealth: Payer: Self-pay | Admitting: Oncology

## 2018-12-06 NOTE — Telephone Encounter (Signed)
Dr Benay Spice PAL, moved 02/20 to 02/24. Spoke to patient

## 2018-12-28 ENCOUNTER — Telehealth: Payer: Self-pay | Admitting: *Deleted

## 2018-12-28 NOTE — Telephone Encounter (Signed)
"  Ive not heard anything about scans or appointments.  Will the scan be without contrast?  Do I nedd to pick anything up?  Is there a prep or activity for me to do before CTscan?  I'm not scheduled for lab.  Does Dr. Benay Spice need lab work."  All questions answered.   CT chest with contrast prep is NPO 4-hours before except lquids and medications.   :I'll arrive at 7:45 am and will not eat anything before I go just in case.  Thank you."

## 2019-01-01 ENCOUNTER — Ambulatory Visit (HOSPITAL_COMMUNITY)
Admission: RE | Admit: 2019-01-01 | Discharge: 2019-01-01 | Disposition: A | Discharge status: Home / Self Care | Payer: Medicare Other | Source: Ambulatory Visit | Attending: Oncology | Admitting: Oncology

## 2019-01-01 ENCOUNTER — Encounter (HOSPITAL_COMMUNITY): Payer: Self-pay

## 2019-01-01 DIAGNOSIS — C9110 Chronic lymphocytic leukemia of B-cell type not having achieved remission: Secondary | ICD-10-CM | POA: Diagnosis not present

## 2019-01-01 DIAGNOSIS — C8590 Non-Hodgkin lymphoma, unspecified, unspecified site: Secondary | ICD-10-CM | POA: Diagnosis not present

## 2019-01-03 ENCOUNTER — Ambulatory Visit: Payer: BLUE CROSS/BLUE SHIELD | Admitting: Oncology

## 2019-01-03 DIAGNOSIS — Z1231 Encounter for screening mammogram for malignant neoplasm of breast: Secondary | ICD-10-CM | POA: Diagnosis not present

## 2019-01-07 ENCOUNTER — Telehealth: Payer: Self-pay | Admitting: Oncology

## 2019-01-07 ENCOUNTER — Inpatient Hospital Stay: Payer: Medicare Other | Attending: Oncology | Admitting: Oncology

## 2019-01-07 VITALS — BP 117/73 | HR 75 | Temp 98.2°F | Resp 16 | Ht 67.0 in | Wt 174.4 lb

## 2019-01-07 DIAGNOSIS — C9110 Chronic lymphocytic leukemia of B-cell type not having achieved remission: Secondary | ICD-10-CM | POA: Diagnosis not present

## 2019-01-07 DIAGNOSIS — Z85820 Personal history of malignant melanoma of skin: Secondary | ICD-10-CM | POA: Insufficient documentation

## 2019-01-07 DIAGNOSIS — E119 Type 2 diabetes mellitus without complications: Secondary | ICD-10-CM

## 2019-01-07 DIAGNOSIS — G35 Multiple sclerosis: Secondary | ICD-10-CM | POA: Diagnosis not present

## 2019-01-07 DIAGNOSIS — J449 Chronic obstructive pulmonary disease, unspecified: Secondary | ICD-10-CM | POA: Diagnosis not present

## 2019-01-07 NOTE — Progress Notes (Signed)
  [facility] OFFICE PROGRESS NOTE   Diagnosis: CLL  INTERVAL HISTORY:   Mr. Rachel Coffey returns for a scheduled visit.  He feels well.  Good appetite.  No fever.  She reports frequent upper airway congestion.  No severe infection.  No abdominal pain.  Objective:  Vital signs in last 24 hours:  Blood pressure 117/73, pulse 75, temperature 98.2 F (36.8 C), temperature source Oral, resp. rate 16, height _0  (1.702 m), weight 174 lb 6.4 oz (79.1 kg), SpO2 98 %.    HEENT: Neck without mass Lymphatics: Cervical, supraclavicular, axillary, or inguinal nodes Resp: Lungs clear bilaterally, no respiratory distress Cardio: Regular rate and rhythm GI: No hepatosplenomegaly, no mass, nontender Vascular: No leg edema   Lab Results:  Lab Results  Component Value Date   WBC 5.3 07/02/2018   HGB 13.8 07/02/2018   HCT 41.0 07/02/2018   MCV 86.1 07/02/2018   PLT 207 07/02/2018   NEUTROABS 3.8 07/02/2018    CMP  Lab Results  Component Value Date   NA 142 07/02/2018   K 4.3 07/02/2018   CL 103 07/02/2018   CO2 30 07/02/2018   GLUCOSE 133 (H) 07/02/2018   BUN 9 07/02/2018   CREATININE 0.76 07/02/2018   CALCIUM 9.9 07/02/2018   PROT 6.8 12/08/2017   ALBUMIN 4.1 12/08/2017   AST 17 12/08/2017   ALT 15 12/08/2017   ALKPHOS 74 12/08/2017   BILITOT 0.5 12/08/2017   GFRNONAA >60 07/02/2018   GFRAA >60 07/02/2018     Medications: I have reviewed the patient's current medications.   Assessment/Plan: 1. 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 Monoclonal Kappa restricted B-cell population identified onflow cytometry ? Staging PET scan 06/13/2016 confirmed hypermetabolic abdominal/pelvic lymphadenopathy ? FISHpanel positive for 11q-,-12,13q-, and 17p-(10% of cells) ? Peripheral blood flow cytometry 09/22/2016 at [id number] positive For restricted B-cell population, 2% of lymphocytes ? Peripheral blood IGH  mutation analysis at [facility] 09/22/2016-unmutated ? CT 06/06/2017-generally stable diffuse lymphadenopathy, node in the pelvis is smaller, some the lymph nodes are larger ? CTs 07/02/2018- slight decrease in size of retrocrural, retroperitoneal, and mesenteric adenopathy, new 6 mm left upper lobe nodule ? CT chest 01/01/2019- increase in size of left upper lobe lung nodule-inflammatory appearance, other stable lung nodules, right retrocrural and upper abdominal adenopathy has progressed  2. Stage IB (T1b,N0) melanoma of the right forearm, status post a wide excision and sentinel lymph node biopsy at [facility] March 2017   3. Multiple sclerosis  4. Diabetes  5.   COPD   Disposition: Rachel Coffey appears well.  We reviewed the CT images.  She appears asymptomatic from the low-grade lymphoma.  The plan is to continue observation.  The left upper lobe nodule is likely benign.  We will schedule a follow-up chest CT at a six-month interval. I discussed the differential diagnosis with Rachel Coffey and her family including the possibility of an indolent lung cancer.  She is comfortable with continued observation.  She will return for an office visit and restaging CTs in 6 months.  She will contact us in the interim for new symptoms.  Rachel Coder, MD  01/07/2019  8:49 AM

## 2019-01-07 NOTE — Telephone Encounter (Signed)
Gave contrast, declined printout

## 2019-03-05 DIAGNOSIS — E785 Hyperlipidemia, unspecified: Secondary | ICD-10-CM | POA: Diagnosis not present

## 2019-03-05 DIAGNOSIS — E1169 Type 2 diabetes mellitus with other specified complication: Secondary | ICD-10-CM | POA: Diagnosis not present

## 2019-03-27 DIAGNOSIS — G35 Multiple sclerosis: Secondary | ICD-10-CM | POA: Diagnosis not present

## 2019-03-27 DIAGNOSIS — E559 Vitamin D deficiency, unspecified: Secondary | ICD-10-CM | POA: Diagnosis not present

## 2019-05-22 ENCOUNTER — Encounter: Payer: Self-pay | Admitting: Oncology

## 2019-06-05 DIAGNOSIS — Z808 Family history of malignant neoplasm of other organs or systems: Secondary | ICD-10-CM | POA: Diagnosis not present

## 2019-06-05 DIAGNOSIS — D225 Melanocytic nevi of trunk: Secondary | ICD-10-CM | POA: Diagnosis not present

## 2019-06-05 DIAGNOSIS — Z86007 Personal history of in-situ neoplasm of skin: Secondary | ICD-10-CM | POA: Diagnosis not present

## 2019-06-05 DIAGNOSIS — L814 Other melanin hyperpigmentation: Secondary | ICD-10-CM | POA: Diagnosis not present

## 2019-06-05 DIAGNOSIS — L821 Other seborrheic keratosis: Secondary | ICD-10-CM | POA: Diagnosis not present

## 2019-06-05 DIAGNOSIS — Z85820 Personal history of malignant melanoma of skin: Secondary | ICD-10-CM | POA: Diagnosis not present

## 2019-07-04 ENCOUNTER — Ambulatory Visit (HOSPITAL_COMMUNITY)
Admission: RE | Admit: 2019-07-04 | Discharge: 2019-07-04 | Disposition: A | Discharge status: Home / Self Care | Payer: Medicare Other | Source: Ambulatory Visit | Attending: Oncology | Admitting: Oncology

## 2019-07-04 ENCOUNTER — Other Ambulatory Visit: Payer: Self-pay

## 2019-07-04 ENCOUNTER — Inpatient Hospital Stay: Payer: Medicare Other | Attending: Oncology

## 2019-07-04 DIAGNOSIS — E119 Type 2 diabetes mellitus without complications: Secondary | ICD-10-CM | POA: Diagnosis not present

## 2019-07-04 DIAGNOSIS — G35 Multiple sclerosis: Secondary | ICD-10-CM | POA: Diagnosis not present

## 2019-07-04 DIAGNOSIS — R590 Localized enlarged lymph nodes: Secondary | ICD-10-CM | POA: Diagnosis not present

## 2019-07-04 DIAGNOSIS — C9110 Chronic lymphocytic leukemia of B-cell type not having achieved remission: Secondary | ICD-10-CM | POA: Insufficient documentation

## 2019-07-04 DIAGNOSIS — J449 Chronic obstructive pulmonary disease, unspecified: Secondary | ICD-10-CM | POA: Diagnosis not present

## 2019-07-04 DIAGNOSIS — R55 Syncope and collapse: Secondary | ICD-10-CM | POA: Insufficient documentation

## 2019-07-04 DIAGNOSIS — Z85820 Personal history of malignant melanoma of skin: Secondary | ICD-10-CM | POA: Insufficient documentation

## 2019-07-04 DIAGNOSIS — M5136 Other intervertebral disc degeneration, lumbar region: Secondary | ICD-10-CM | POA: Diagnosis not present

## 2019-07-04 DIAGNOSIS — I2510 Atherosclerotic heart disease of native coronary artery without angina pectoris: Secondary | ICD-10-CM | POA: Diagnosis not present

## 2019-07-04 DIAGNOSIS — K5730 Diverticulosis of large intestine without perforation or abscess without bleeding: Secondary | ICD-10-CM | POA: Insufficient documentation

## 2019-07-04 LAB — BASIC METABOLIC PANEL - CANCER CENTER ONLY
Anion gap: 12 (ref 5–15)
BUN: 17 mg/dL (ref 8–23)
CO2: 27 mmol/L (ref 22–32)
Calcium: 9.5 mg/dL (ref 8.9–10.3)
Chloride: 100 mmol/L (ref 98–111)
Creatinine: 0.78 mg/dL (ref 0.44–1.00)
GFR, Est AFR Am: >60 mL/min (ref >60)
GFR, Estimated: >60 mL/min (ref >60)
Glucose, Bld: 116 mg/dL — ABNORMAL HIGH (ref 70–99)
Potassium: 4 mmol/L (ref 3.5–5.1)
Sodium: 139 mmol/L (ref 135–145)

## 2019-07-04 LAB — CBC WITH DIFFERENTIAL (CANCER CENTER ONLY)
Abs Immature Granulocytes: 0.01 10*3/uL (ref 0.00–0.07)
Basophils Absolute: 0 10*3/uL (ref 0.0–0.1)
Basophils Relative: 1 %
Eosinophils Absolute: 0.2 10*3/uL (ref 0.0–0.5)
Eosinophils Relative: 3 %
HCT: 45.2 % (ref 36.0–46.0)
Hemoglobin: 14.6 g/dL (ref 12.0–15.0)
Immature Granulocytes: 0 %
Lymphocytes Relative: 24 %
Lymphs Abs: 1.4 10*3/uL (ref 0.7–4.0)
MCH: 28 pg (ref 26.0–34.0)
MCHC: 32.3 g/dL (ref 30.0–36.0)
MCV: 86.6 fL (ref 80.0–100.0)
Monocytes Absolute: 0.5 10*3/uL (ref 0.1–1.0)
Monocytes Relative: 8 %
Neutro Abs: 3.7 10*3/uL (ref 1.7–7.7)
Neutrophils Relative %: 64 %
Platelet Count: 190 10*3/uL (ref 150–400)
RBC: 5.22 MIL/uL — ABNORMAL HIGH (ref 3.87–5.11)
RDW: 12.1 % (ref 11.5–15.5)
WBC Count: 5.8 10*3/uL (ref 4.0–10.5)
nRBC: 0 % (ref 0.0–0.2)

## 2019-07-04 LAB — LACTATE DEHYDROGENASE: LDH: 175 U/L (ref 98–192)

## 2019-07-04 MED ORDER — SODIUM CHLORIDE (PF) 0.9 % IJ SOLN
INTRAMUSCULAR | Status: AC
  Filled 2019-07-04: qty 50

## 2019-07-04 MED ORDER — IOHEXOL 300 MG/ML  SOLN
100.0000 mL | Freq: Once | INTRAMUSCULAR | Status: AC | PRN
  Administered 2019-07-04: 100 mL via INTRAVENOUS

## 2019-07-05 ENCOUNTER — Telehealth: Payer: Self-pay | Admitting: Oncology

## 2019-07-05 ENCOUNTER — Telehealth: Payer: Self-pay | Admitting: *Deleted

## 2019-07-05 ENCOUNTER — Inpatient Hospital Stay (HOSPITAL_BASED_OUTPATIENT_CLINIC_OR_DEPARTMENT_OTHER): Payer: Medicare Other | Admitting: Oncology

## 2019-07-05 ENCOUNTER — Other Ambulatory Visit: Payer: Self-pay

## 2019-07-05 ENCOUNTER — Encounter: Payer: Self-pay | Admitting: *Deleted

## 2019-07-05 VITALS — BP 123/59 | HR 81 | Temp 98.5°F | Resp 18 | Ht 67.0 in | Wt 184.4 lb

## 2019-07-05 DIAGNOSIS — R55 Syncope and collapse: Secondary | ICD-10-CM | POA: Diagnosis not present

## 2019-07-05 DIAGNOSIS — C9110 Chronic lymphocytic leukemia of B-cell type not having achieved remission: Secondary | ICD-10-CM | POA: Diagnosis not present

## 2019-07-05 DIAGNOSIS — R590 Localized enlarged lymph nodes: Secondary | ICD-10-CM | POA: Diagnosis not present

## 2019-07-05 DIAGNOSIS — M5136 Other intervertebral disc degeneration, lumbar region: Secondary | ICD-10-CM | POA: Diagnosis not present

## 2019-07-05 DIAGNOSIS — K5730 Diverticulosis of large intestine without perforation or abscess without bleeding: Secondary | ICD-10-CM | POA: Diagnosis not present

## 2019-07-05 DIAGNOSIS — I2510 Atherosclerotic heart disease of native coronary artery without angina pectoris: Secondary | ICD-10-CM | POA: Diagnosis not present

## 2019-07-05 NOTE — Telephone Encounter (Signed)
Called to report that [facility] did not receive the referral that Dr. Benay Spice ordered today. She called and was told that [facility] could call Amy at [phone] and she will initiate the referral. Forwarded this info to HIM>

## 2019-07-05 NOTE — Telephone Encounter (Signed)
Scheduled per 08/21 los, patient did not want copy of avs or calender. Appointments will appear on Mychart.

## 2019-07-05 NOTE — Progress Notes (Signed)
[place] OFFICE PROGRESS NOTE   Diagnosis: CLL  INTERVAL HISTORY:   Rachel Coffey returns as scheduled.  She feels well.  Appetite.  No fever or night sweats.  No palpable she has occasional episodes of nausea, this predates the diagnosis of CLL.  Objective:  Vital signs in last 24 hours:  Blood pressure (!) 123/59, pulse 81, temperature 98.5 F (36.9 C), temperature source Oral, resp. rate 18, height 5' 7"  (1.702 m), weight 184 lb 6.4 oz (83.6 kg), SpO2 97 %.   Limited physical examination secondary to distancing pandemic HEENT: Neck without mass Lymphatics: No cervical, supraclavicular, axillary, or inguinal GI: No hepatosplenomegaly, no mass, nontender Vascular: No leg edema   Lab Results:  Lab Results  Component Value Date   WBC 5.8 07/04/2019   HGB 14.6 07/04/2019   HCT 45.2 07/04/2019   MCV 86.6 07/04/2019   PLT 190 07/04/2019   NEUTROABS 3.7 07/04/2019    CMP  Lab Results  Component Value Date   NA 139 07/04/2019   K 4.0 07/04/2019   CL 100 07/04/2019   CO2 27 07/04/2019   GLUCOSE 116 (H) 07/04/2019   BUN 17 07/04/2019   CREATININE 0.78 07/04/2019   CALCIUM 9.5 07/04/2019   PROT 6.8 12/08/2017   ALBUMIN 4.1 12/08/2017   AST 17 12/08/2017   ALT 15 12/08/2017   ALKPHOS 74 12/08/2017   BILITOT 0.5 12/08/2017   GFRNONAA >60 07/04/2019   GFRAA >60 07/04/2019    No results found for: CEA1  Lab Results  Component Value Date   INR 0.94 06/20/2016    Imaging:  Ct Chest W Contrast  Result Date: 07/05/2019 CLINICAL DATA:  Restaging chronic lymphocytic lymphoma and follow up lung nodule. History of melanoma removed from the right arm. EXAM: CT CHEST, ABDOMEN, AND PELVIS WITH CONTRAST TECHNIQUE: Multidetector CT imaging of the chest, abdomen and pelvis was performed following the standard protocol during bolus administration of intravenous contrast. CONTRAST:  133m OMNIPAQUE IOHEXOL 300 MG/ML  SOLN COMPARISON:  Multiple exams, including  CT exams from 01/01/2019 and 07/02/2018 FINDINGS: CT CHEST FINDINGS Cardiovascular: Coronary, aortic arch, and branch vessel atherosclerotic vascular disease. Mediastinum/Nodes: Lower thoracic periaortic adenopathy noted, index lymph node measures 1.8 cm on image 57/2, formerly 1.2 cm. Lungs/Pleura: Biapical pleuroparenchymal scarring. Airway thickening is present, suggesting bronchitis or reactive airways disease. There are a few tiny scattered pulmonary nodules which for the most part appear stable. In the left upper lobe, a branching cluster of nodules is mildly increased in prominence with the largest of the associated nodules measuring 0.3 by 0.4 cm on image 68/4, previously 0.2 by 0.4 cm. Posteriorly in the left upper lobe, the branching nodularity on image 49/4 is stable and appears contiguous with an airway compatible with a small bronchocele/plugged airway with mild bronchiectasis. Musculoskeletal: Thoracic spondylosis. Mild levoconvex upper thoracic scoliosis. CT ABDOMEN PELVIS FINDINGS Hepatobiliary: Reduced conspicuity of the previous 0.9 by 0.6 cm hypodense lesion medial segment left hepatic lobe previously shown on image 58/2 of the 07/02/2018 study. Otherwise the several hepatic hypodense lesions appear stable. Prior cholecystectomy. Common bile duct 0.6 cm in diameter. No new or worrisome lesions in the liver identified. Pancreas: Unremarkable Spleen: Subtle heterogeneity of enhancement in the spleen including a slightly hypoenhancing 3.1 cm region posteriorly on image 60/2, not readily seen on prior exams. The spleen measures 8.7 by 5.8 by 9.2 cm (volume = 240 cm^3). Adrenals/Urinary Tract: Unremarkable Stomach/Bowel: Mild sigmoid colon diverticulosis. Vascular/Lymphatic: Aortoiliac atherosclerotic vascular disease. Pathologic  gastrohepatic ligament, porta hepatis, retrocrural, periaortic, left external iliac, and mesenteric adenopathy. The dominant lesion is a large mass partially in the vicinity  of the lesser sac of pancreas, abutting the lesser curvature of the stomach, the left hepatic lobe, and encasing the splenic artery. This mass measures 11.2 by 5.3 cm, formerly 6.9 by 3.3 cm on 07/02/2018. This lesion has mass effect on the pancreas and on the stomach. Index portacaval node 2.9 cm in short axis on image 63/2, formerly 1.1 cm. Index left periaortic lymph node 1.9 cm in short axis on image 74/2, formerly 1.3 cm. Index mesenteric node 1.4 cm in short axis on image 81/2, previously 1.2 cm. Reproductive: Uterus absent.  Adnexa unremarkable. Other: No supplemental non-categorized findings. Musculoskeletal: Degenerative disc disease at L4-5. Degenerative facet arthropathy in the lower lumbar spine. Low position of the anorectal junction applies pelvic floor laxity. IMPRESSION: 1. Significant worsening lower thoracic and abdominal adenopathy compared to prior exams. The dominant lesion is an 11.2 by 5.3 cm lymph node encasing the splenic artery, with extrinsic mass effect on the pancreas and stomach. 2. There is some faint new heterogeneity in the spleen including a 3.1 cm faint hypodensity posteriorly in the spleen. A cannot excluding the splenic lesion. No splenomegaly. 3. There some tiny scattered pulmonary nodules which appear essentially stable. This includes the branching lesion in the left upper lobe which is most likely a small bronchocele or plugged airway. Continued surveillance suggested. 4. Other imaging findings of potential clinical significance: Aortic Atherosclerosis (ICD10-I70.0). Coronary atherosclerosis. Airway thickening is present, suggesting bronchitis or reactive airways disease. Suspected mild pelvic floor laxity. Electronically Signed   By: Van Clines M.D.   On: 07/05/2019 08:20   Ct Abdomen Pelvis W Contrast  Result Date: 07/05/2019 CLINICAL DATA:  Restaging chronic lymphocytic lymphoma and follow up lung nodule. History of melanoma removed from the right arm. EXAM: CT  CHEST, ABDOMEN, AND PELVIS WITH CONTRAST TECHNIQUE: Multidetector CT imaging of the chest, abdomen and pelvis was performed following the standard protocol during bolus administration of intravenous contrast. CONTRAST:  161m OMNIPAQUE IOHEXOL 300 MG/ML  SOLN COMPARISON:  Multiple exams, including CT exams from 01/01/2019 and 07/02/2018 FINDINGS: CT CHEST FINDINGS Cardiovascular: Coronary, aortic arch, and branch vessel atherosclerotic vascular disease. Mediastinum/Nodes: Lower thoracic periaortic adenopathy noted, index lymph node measures 1.8 cm on image 57/2, formerly 1.2 cm. Lungs/Pleura: Biapical pleuroparenchymal scarring. Airway thickening is present, suggesting bronchitis or reactive airways disease. There are a few tiny scattered pulmonary nodules which for the most part appear stable. In the left upper lobe, a branching cluster of nodules is mildly increased in prominence with the largest of the associated nodules measuring 0.3 by 0.4 cm on image 68/4, previously 0.2 by 0.4 cm. Posteriorly in the left upper lobe, the branching nodularity on image 49/4 is stable and appears contiguous with an airway compatible with a small bronchocele/plugged airway with mild bronchiectasis. Musculoskeletal: Thoracic spondylosis. Mild levoconvex upper thoracic scoliosis. CT ABDOMEN PELVIS FINDINGS Hepatobiliary: Reduced conspicuity of the previous 0.9 by 0.6 cm hypodense lesion medial segment left hepatic lobe previously shown on image 58/2 of the 07/02/2018 study. Otherwise the several hepatic hypodense lesions appear stable. Prior cholecystectomy. Common bile duct 0.6 cm in diameter. No new or worrisome lesions in the liver identified. Pancreas: Unremarkable Spleen: Subtle heterogeneity of enhancement in the spleen including a slightly hypoenhancing 3.1 cm region posteriorly on image 60/2, not readily seen on prior exams. The spleen measures 8.7 by 5.8 by 9.2 cm (volume =  240 cm^3). Adrenals/Urinary Tract: Unremarkable  Stomach/Bowel: Mild sigmoid colon diverticulosis. Vascular/Lymphatic: Aortoiliac atherosclerotic vascular disease. Pathologic gastrohepatic ligament, porta hepatis, retrocrural, periaortic, left external iliac, and mesenteric adenopathy. The dominant lesion is a large mass partially in the vicinity of the lesser sac of pancreas, abutting the lesser curvature of the stomach, the left hepatic lobe, and encasing the splenic artery. This mass measures 11.2 by 5.3 cm, formerly 6.9 by 3.3 cm on 07/02/2018. This lesion has mass effect on the pancreas and on the stomach. Index portacaval node 2.9 cm in short axis on image 63/2, formerly 1.1 cm. Index left periaortic lymph node 1.9 cm in short axis on image 74/2, formerly 1.3 cm. Index mesenteric node 1.4 cm in short axis on image 81/2, previously 1.2 cm. Reproductive: Uterus absent.  Adnexa unremarkable. Other: No supplemental non-categorized findings. Musculoskeletal: Degenerative disc disease at L4-5. Degenerative facet arthropathy in the lower lumbar spine. Low position of the anorectal junction applies pelvic floor laxity. IMPRESSION: 1. Significant worsening lower thoracic and abdominal adenopathy compared to prior exams. The dominant lesion is an 11.2 by 5.3 cm lymph node encasing the splenic artery, with extrinsic mass effect on the pancreas and stomach. 2. There is some faint new heterogeneity in the spleen including a 3.1 cm faint hypodensity posteriorly in the spleen. A cannot excluding the splenic lesion. No splenomegaly. 3. There some tiny scattered pulmonary nodules which appear essentially stable. This includes the branching lesion in the left upper lobe which is most likely a small bronchocele or plugged airway. Continued surveillance suggested. 4. Other imaging findings of potential clinical significance: Aortic Atherosclerosis (ICD10-I70.0). Coronary atherosclerosis. Airway thickening is present, suggesting bronchitis or reactive airways disease. Suspected  mild pelvic floor laxity. Electronically Signed   By: Van Clines M.D.   On: 07/05/2019 08:20    Medications: I have reviewed the patient's current medications.   Assessment/Plan: 1. 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 Monoclonal Kappa restricted B-cell population identified onflow cytometry ? Staging PET scan 06/13/2016 confirmed hypermetabolic abdominal/pelvic lymphadenopathy ? FISHpanel positive for 11q-,-12,13q-, and 17p-(10% of cells) ? Peripheral blood flow cytometry 09/22/2016 at [facility]-CD5/CD23 positive For restricted B-cell population, 2% of lymphocytes ? Peripheral blood IGH mutation analysis at [facility] 09/22/2016-unmutated ? CT 06/06/2017-generally stable diffuse lymphadenopathy, node in the pelvis is smaller, some the lymph nodes are larger ? CTs 07/02/2018- slight decrease in size of retrocrural, retroperitoneal, and mesenteric adenopathy, new 6 mm left upper lobe nodule ? CT chest 01/01/2019- increase in size of left upper lobe lung nodule-inflammatory appearance, other stable lung nodules, right retrocrural and upper abdominal adenopathy has progressed ? CTs 07/04/2019- progressive lower thoracic and abdominal adenopathy with dominant mass-effect on the stomach and pancreas state new heterogeneity in the spleen- possible splenic lesion, stable lung nodules  2. Stage IB (T1b,N0) melanoma of the right forearm, status post a wide excision and sentinel lymph node biopsy at [facility] March 2017   3. Multiple sclerosis  4. Diabetes  5.   COPD    Disposition: Rachel Coffey appears unchanged.  She is asymptomatic from the small lymphocytic lymphoma, but the CT reveals significant progression of a dominant upper abdominal mass.  I am concerned she may develop symptoms in the near future.  I reviewed the CT images and discussed treatment options with Rachel Coffey.  I recommend acalabrutinib when a decision is made  to proceed with therapy.  We reviewed potential toxicities associated with acalabrutinib including the chance of hematologic toxicity, rash,  arthralgias, diarrhea, bleeding, and arrhythmias.  She will schedule appointment with Dr. Elita Boone to get his opinion.  She will return for an office visit.  25 minutes were spent with the patient today.  The majority of the time was used for counseling and coordination of care.  [name], MD  07/05/2019  8:42 AM

## 2019-07-06 ENCOUNTER — Encounter: Payer: Self-pay | Admitting: Oncology

## 2019-07-08 ENCOUNTER — Other Ambulatory Visit: Payer: Self-pay

## 2019-07-08 ENCOUNTER — Telehealth: Payer: Self-pay

## 2019-07-08 DIAGNOSIS — E663 Overweight: Secondary | ICD-10-CM | POA: Diagnosis not present

## 2019-07-08 DIAGNOSIS — Z23 Encounter for immunization: Secondary | ICD-10-CM | POA: Diagnosis not present

## 2019-07-08 DIAGNOSIS — E785 Hyperlipidemia, unspecified: Secondary | ICD-10-CM | POA: Diagnosis not present

## 2019-07-08 DIAGNOSIS — E119 Type 2 diabetes mellitus without complications: Secondary | ICD-10-CM | POA: Diagnosis not present

## 2019-07-08 DIAGNOSIS — E1169 Type 2 diabetes mellitus with other specified complication: Secondary | ICD-10-CM | POA: Diagnosis not present

## 2019-07-08 DIAGNOSIS — G35 Multiple sclerosis: Secondary | ICD-10-CM | POA: Diagnosis not present

## 2019-07-08 DIAGNOSIS — Z0000 Encounter for general adult medical examination without abnormal findings: Secondary | ICD-10-CM | POA: Diagnosis not present

## 2019-07-08 DIAGNOSIS — C8593 Non-Hodgkin lymphoma, unspecified, intra-abdominal lymph nodes: Secondary | ICD-10-CM | POA: Diagnosis not present

## 2019-07-08 DIAGNOSIS — Z6829 Body mass index (BMI) 29.0-29.9, adult: Secondary | ICD-10-CM | POA: Diagnosis not present

## 2019-07-08 NOTE — Telephone Encounter (Signed)
Rquested documents faxed to [facility] at [phone] ATTN Amy.

## 2019-07-08 NOTE — Progress Notes (Signed)
Added Nexium to pt active med list per pt request.

## 2019-07-09 ENCOUNTER — Telehealth: Payer: Self-pay | Admitting: *Deleted

## 2019-07-09 NOTE — Telephone Encounter (Signed)
Referral faxed to [facility] -  Release [id number]

## 2019-07-22 ENCOUNTER — Encounter: Payer: Self-pay | Admitting: Oncology

## 2019-07-24 DIAGNOSIS — C8300 Small cell B-cell lymphoma, unspecified site: Secondary | ICD-10-CM | POA: Insufficient documentation

## 2019-07-25 DIAGNOSIS — C8300 Small cell B-cell lymphoma, unspecified site: Secondary | ICD-10-CM | POA: Diagnosis not present

## 2019-07-31 DIAGNOSIS — Z23 Encounter for immunization: Secondary | ICD-10-CM | POA: Diagnosis not present

## 2019-08-02 ENCOUNTER — Encounter: Payer: Self-pay | Admitting: General Practice

## 2019-08-02 NOTE — Progress Notes (Signed)
[place] CSW Progress Notes  Call from patient, concerned about cost of medication discussed by MD as possible treatment - acalabrutinib - has called and found out that it appears to be unaffordable for her.  Message sent to Merceda Elks RN, financial advocate and oncologist w patient concern.  Also sent patient email w general information on medication affordability for cancer patients.  Edwyna Shell, LCSW Clinical Social Worker Phone:  [phone]

## 2019-08-04 ENCOUNTER — Encounter: Payer: Self-pay | Admitting: Oncology

## 2019-08-08 ENCOUNTER — Telehealth: Payer: Self-pay

## 2019-08-08 ENCOUNTER — Telehealth: Payer: Self-pay | Admitting: Pharmacist

## 2019-08-08 ENCOUNTER — Telehealth: Payer: Self-pay | Admitting: Oncology

## 2019-08-08 ENCOUNTER — Inpatient Hospital Stay: Payer: Medicare Other | Attending: Oncology | Admitting: Oncology

## 2019-08-08 ENCOUNTER — Other Ambulatory Visit: Payer: Self-pay

## 2019-08-08 VITALS — BP 106/61 | HR 95 | Temp 98.3°F | Resp 18 | Ht 67.0 in | Wt 183.4 lb

## 2019-08-08 DIAGNOSIS — G35 Multiple sclerosis: Secondary | ICD-10-CM | POA: Insufficient documentation

## 2019-08-08 DIAGNOSIS — J449 Chronic obstructive pulmonary disease, unspecified: Secondary | ICD-10-CM | POA: Diagnosis not present

## 2019-08-08 DIAGNOSIS — E119 Type 2 diabetes mellitus without complications: Secondary | ICD-10-CM | POA: Diagnosis not present

## 2019-08-08 DIAGNOSIS — C9110 Chronic lymphocytic leukemia of B-cell type not having achieved remission: Secondary | ICD-10-CM | POA: Diagnosis not present

## 2019-08-08 DIAGNOSIS — Z85820 Personal history of malignant melanoma of skin: Secondary | ICD-10-CM | POA: Insufficient documentation

## 2019-08-08 MED ORDER — IBRUTINIB 420 MG PO TABS: 420.0000 mg | ORAL_TABLET | Freq: Every day | ORAL | 0 refills | Status: DC

## 2019-08-08 NOTE — Telephone Encounter (Signed)
Oral Oncology Patient Advocate Encounter  Prior Authorization for Kate Sable has been approved.    PA# [id number] Effective dates: 08/08/19 through 08/07/20  Oral Oncology Clinic will continue to follow.   [place] Patient [place] Phone [phone] Fax [phone] 08/08/2019    12:23 PM

## 2019-08-08 NOTE — Telephone Encounter (Signed)
Oral Chemotherapy Pharmacist Encounter   I spoke with patient in exam room for overview of: Imbruvica (ibritunib) for the treatment of small lymphocytitic leukemia/chronic lymphocytic leukemia with known del17p, planned durationuntil disease progression or unacceptable toxicity.   Counseled patient on administration, dosing, side effects, monitoring, drug-food interactions, safe handling, storage, and disposal.  Patient will take Imbruvica 472m tablets, 1 tablet (4242m by mouth once daily.  Patient will take Imbruvica at approximately the same time each day with a full glass of water and maintain adequate hydration throughout the day.  Patient knows to avoid grapefruit or grapefruit juice while on therapy with Imbruvica.  Imbruvica start date: planned for 08/15/2019  Adverse effects include but are not limited to: bruising, decreased blood counts, N/V, diarrhea, musculoskeletal pain, arthralgias, peripheral edema, and hemorrhage.   Patient will obtain anti diarrheal and alert the office of 4 or more loose stools above baseline.  Patient has meloxicam on hand for arthritis symptoms. She states she takes this 1-2 times per month. Patient informed about moderate medication interaction with ibrutinib due to anti-platelet properties of both agents. Patient will alert the office with issues related to bruising or bleeding.  Patient states she has baseline issues with heart fluttering (undocumented) in times of extreme stress. We reviewed the increased risk of atrial fibrillation. Patient will alert the office if these episodes start to occur more frequently or with increased intensity.  Patient states she has baseline issues with diarrhea. She does have loperamide at home. Patient informed there is a risk of increased watery or loose stools with the use of ibrutnib, however, our experience is mild at most. She will alert the office with major changes to her bowels.  Reviewed with patient  importance of keeping a medication schedule and plan for any missed doses.  Medication reconciliation performed and medication/allergy list updated.  Insurance authorization for ImKate Sableill be submitted. We extensively discussed medicare copayment and possibility for high copayment. We discussed options for copayment assistance including copayment grant foundations and manufacturer compassionate use program. Patient provided income information to be used for securing foundation copayment grant, if copayment is high.  All questions answered.  Mrs. Straker voiced understanding and appreciation.   Patient knows to call the office with questions or concerns.  JeJohny DrillingPharmD, BCPS, BCOP  08/08/2019   10:41 AM Oral Oncology Clinic [phone]

## 2019-08-08 NOTE — Telephone Encounter (Signed)
Oral Oncology Pharmacist Encounter  Discussed medication interaction between acalabrutinib and omeprazole with MD and patient. Patient is willing to discontinue use of esomeprazole, however, when she has done so in the past it has led to an increase in reflux symptoms.  No contraindications for starting on ibrutinib. Referral will actually be changed to ibrutinib from acalabrutinib.  We will cancel the acalabrutinib referral.  If patient has issues with ibrutinib we will switch to acalabrutinib, discontinue the use of esomeprazole, initiate twice daily famotidine, in addition to as needed calcium carbonate.  Johny Drilling, PharmD, BCPS, BCOP  08/08/2019 10:24 AM Oral Oncology Clinic [phone]

## 2019-08-08 NOTE — Telephone Encounter (Signed)
Scheduled appt per 9/24 los. Patient declined calendar and avs.

## 2019-08-08 NOTE — Progress Notes (Signed)
[place] OFFICE PROGRESS NOTE   Diagnosis: CLL  INTERVAL HISTORY:   Rachel Coffey returns as scheduled.  She feels well.  No fever or night sweats.  No palpable lymph node or masses.  She had an episode of abdominal pain and diarrhea earlier this week.  Rachel Coffey saw Dr. Elita Boone on 07/25/2019.  She recommends beginning systemic therapy with a BTK inhibitor.  Objective:  Vital signs in last 24 hours:  Blood pressure 106/61, pulse 95, temperature 98.3 F (36.8 C), temperature source Oral, resp. rate 18, height 5' 7"  (1.702 m), weight 183 lb 6.4 oz (83.2 kg), SpO2 95 %.    Limited physical examination secondary to distancing with the COVID pandemic Cardio: Regular rate and rhythm GI: Nontender, no mass, no hepatosplenomegaly Vascular: No leg edema      Lab Results:  Lab Results  Component Value Date   WBC 5.8 07/04/2019   HGB 14.6 07/04/2019   HCT 45.2 07/04/2019   MCV 86.6 07/04/2019   PLT 190 07/04/2019   NEUTROABS 3.7 07/04/2019    CMP  Lab Results  Component Value Date   NA 139 07/04/2019   K 4.0 07/04/2019   CL 100 07/04/2019   CO2 27 07/04/2019   GLUCOSE 116 (H) 07/04/2019   BUN 17 07/04/2019   CREATININE 0.78 07/04/2019   CALCIUM 9.5 07/04/2019   PROT 6.8 12/08/2017   ALBUMIN 4.1 12/08/2017   AST 17 12/08/2017   ALT 15 12/08/2017   ALKPHOS 74 12/08/2017   BILITOT 0.5 12/08/2017   GFRNONAA >60 07/04/2019   GFRAA >60 07/04/2019    Medications: I have reviewed the patient's current medications.   Assessment/Plan: 1. 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 Monoclonal Kappa restricted B-cell population identified onflow cytometry ? Staging PET scan 06/13/2016 confirmed hypermetabolic abdominal/pelvic lymphadenopathy ? FISHpanel positive for 11q-,-12,13q-, and 17p-(10% of cells) ? Peripheral blood flow cytometry 09/22/2016 at [facility]-CD5/CD23 positive For restricted B-cell  population, 2% of lymphocytes ? Peripheral blood IGH mutation analysis at [facility] 09/22/2016-unmutated ? CT 06/06/2017-generally stable diffuse lymphadenopathy, node in the pelvis is smaller, some the lymph nodes are larger ? CTs 07/02/2018- slight decrease in size of retrocrural, retroperitoneal, and mesenteric adenopathy, new 6 mm left upper lobe nodule ? CT chest 01/01/2019- increase in size of left upper lobe lung nodule-inflammatory appearance, other stable lung nodules, right retrocrural and upper abdominal adenopathy has progressed ? CTs 07/04/2019- progressive lower thoracic and abdominal adenopathy with dominant mass-effect on the stomach and pancreas state new heterogeneity in the spleen- possible splenic lesion, stable lung nodules  2. Stage IB (T1b,N0) melanoma of the right forearm, status post a wide excision and sentinel lymph node biopsy at [facility] March 2017   3. Multiple sclerosis  4. Diabetes  5.   COPD    Disposition: Rachel Coffey appears unchanged.  Dr. Susanne Greenhouse recommends beginning treatment with a BTK inhibitor.  She contacted me by telephone to discuss the case.  I recommend beginning acalabrutinib.  We reviewed potential toxicities associated with both acalabrutinib and ibrutinib.  We discussed the chance of a rash, diarrhea, hematologic toxicity, cardiac toxicity, arthralgias, infection, and bruising/bleeding.  She agrees to proceed.  She will meet with the Cancer center pharmacist for further discussion of side effects and review of her current medical regimen.  The plan is to begin treatment on 08/15/2019.  She will return for an office and lab visit on 08/29/2019.  [name], MD  08/08/2019  8:51  AM  Addendum: After discussion with the Cancer center pharmacy the plan is to begin treatment with ibrutinib instead of acalabrutinib.  Rachel Coffey has significant reflux symptoms and it will be difficult for her to discontinue the proton pump inhibitor.  She  would have to take frequent antiacid therapy sequenced around the acalabrutinib dosing.

## 2019-08-08 NOTE — Telephone Encounter (Signed)
Oral Oncology Patient Advocate Encounter   Was successful in securing patient an $40 grant from Patient [facility]) to provide copayment coverage for Imbruvica.  This will keep the out of pocket expense at $0.     I have spoken with the patient.    The billing information is as follows and has been shared with [place].   Member ID: [id number] Group ID: [id number] RxBin: [id number] Dates of Eligibility: 05/10/19 through 08/06/20  [place] Patient [place] Phone [phone] Fax [phone] 08/08/2019    12:49 PM

## 2019-08-08 NOTE — Telephone Encounter (Signed)
Oral Oncology Pharmacist Encounter  Received new prescription for [place] (acalbrutinib) for the treatment of small lymphocytitic leukemia/chronic lymphocytic leukemia with known del17p, planned duration until disease progression or unacceptable toxicity.  Original diagnosis in 2017, patient has been on active surveillance since that time. Disease progression has been noted on scans over 2020 and now patient is under evaluation to initiate treatment with a BTK inhibitor and acalabritinib at 100 mg twice daily has been chosen by provider.  Labs from 07/04/19 assessed, [place] for treatment initiation. Labs repeated at [facility] (in [place]), [place] for treatment initiation.  Current medication list in Epic reviewed, DDI with acalabritinib and omeprazole identified identified:  Category X interaction: AUC of acalabritunib is dramatically decreased with concurrent use of acid-suppressing agents. Separation of medications will not overcome this interaction due to the long lasting affects of proton pump inhibitor therapy. This will be discussed with MD.  Prescription will be e-scribed to the [facility] for benefits analysis and approval once interaction plan is coordinated.  Oral Oncology Clinic will continue to follow for insurance authorization, copayment issues, initial counseling and start date.  Johny Drilling, PharmD, BCPS, BCOP  08/08/2019 8:49 AM Oral Oncology Clinic [phone]

## 2019-08-08 NOTE — Telephone Encounter (Signed)
Oral Oncology Patient Advocate Encounter  Received notification from [facility] that prior authorization for Imbruvica is required.  PA submitted on CoverMyMeds Key [id number] Status is pending  Oral Oncology Clinic will continue to follow.  [place] Patient [place] Phone [phone] Fax [phone] 08/08/2019    11:41 AM

## 2019-08-08 NOTE — Telephone Encounter (Signed)
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  Original diagnosis in 2017, patient has been on active surveillance since that time. Disease progression has been noted on scans over 2020 and now patient is under evaluation to initiate treatment with a BTK inhibitor and ibrutinib at 420 mg once daily has been chosen by provider.  Labs from 07/04/19 assessed, OK for treatment initiation. Labs repeated at [facility] (in [place]), [place] for treatment initiation.  Current medication list in Epic reviewed, moderate DDI with ibrutnib and meloxicam identified:  Category C interaction: both agents with antiplatelet properties. Patient will be assessed for frequency of meloxicam use. Platelet count remains adequate for the concurrent use of both medication and will be monitored closely during treatment. Patient will be counseled about increased risk of bruising and bleeding.  Prescription has been e-scribed to the [facility] for benefits analysis and approval.  Oral Oncology Clinic will continue to follow for insurance authorization, copayment issues, initial counseling and start date.  Johny Drilling, PharmD, BCPS, BCOP  08/08/2019 10:28 AM Oral Oncology Clinic [phone]

## 2019-08-09 DIAGNOSIS — E1169 Type 2 diabetes mellitus with other specified complication: Secondary | ICD-10-CM | POA: Diagnosis not present

## 2019-08-09 DIAGNOSIS — C8593 Non-Hodgkin lymphoma, unspecified, intra-abdominal lymph nodes: Secondary | ICD-10-CM | POA: Diagnosis not present

## 2019-08-12 MED FILL — IMBRUVICA 420 MG TAB: 420 | 28 days supply | Qty: 28 | Fill #0

## 2019-08-12 NOTE — Telephone Encounter (Signed)
Oral Oncology Patient Advocate Encounter  Confirmed with [place] that Rachel Coffey was shipped on 08/12/19 with a $0 copay using a grant.   [place] Patient [place] Phone [phone] Fax [phone] 08/12/2019   4:05 PM

## 2019-08-20 ENCOUNTER — Telehealth: Payer: Self-pay | Admitting: Pharmacist

## 2019-08-20 NOTE — Telephone Encounter (Signed)
Oral Oncology Pharmacist Encounter  Received call from patient with complaints of abdominal pain last evening that was resolved this morning. Patient states it was similar to previous reflux symptoms that she had experienced. She states she also had an episode of reflux that occurred last Wednesday that was resolved with calcium carbonate. Patient continues on esomeprazole 20 mg once daily, she is trying to wean herself down from 40 mg once daily. Patient will take reactive calcium carbonate if abdominal pain symptoms occur.  And she will continue to record these episodes in her diary. If these episodes continue to occur with regularity she will start taking famotidine 20 mg once every evening before bed. She also has the option to increase her esomeprazole to 40 mg once daily, as she was doing previously. Patient understands and is in agreement with above plan.  Patient endorsed 1 day of frequent diarrhea the day after she started her ibrutinib, this has not recurred. She also endorses a good energy level and is able to exercise regularly.  She will call the office with repeat symptoms of abdominal pain that are not relieved with acid suppression. Patient is planned to be seen in the office next week and will continue to follow-up with Dr. Benay Spice.  All questions answered. Patient expressed understanding and appreciation. She knows to call the office with any additional questions or concerns.  Johny Drilling, PharmD, BCPS, BCOP  08/20/2019 9:03 AM Oral Oncology Clinic [phone]

## 2019-08-29 ENCOUNTER — Inpatient Hospital Stay (HOSPITAL_BASED_OUTPATIENT_CLINIC_OR_DEPARTMENT_OTHER): Payer: Medicare Other | Admitting: Oncology

## 2019-08-29 ENCOUNTER — Inpatient Hospital Stay: Payer: Medicare Other | Attending: Oncology

## 2019-08-29 ENCOUNTER — Telehealth: Payer: Self-pay | Admitting: Oncology

## 2019-08-29 ENCOUNTER — Other Ambulatory Visit: Payer: Self-pay

## 2019-08-29 VITALS — BP 134/52 | HR 86 | Temp 98.0°F | Resp 18 | Ht 67.0 in | Wt 183.8 lb

## 2019-08-29 DIAGNOSIS — E119 Type 2 diabetes mellitus without complications: Secondary | ICD-10-CM | POA: Insufficient documentation

## 2019-08-29 DIAGNOSIS — Z85820 Personal history of malignant melanoma of skin: Secondary | ICD-10-CM | POA: Insufficient documentation

## 2019-08-29 DIAGNOSIS — C9110 Chronic lymphocytic leukemia of B-cell type not having achieved remission: Secondary | ICD-10-CM | POA: Diagnosis not present

## 2019-08-29 DIAGNOSIS — J449 Chronic obstructive pulmonary disease, unspecified: Secondary | ICD-10-CM | POA: Diagnosis not present

## 2019-08-29 DIAGNOSIS — G35 Multiple sclerosis: Secondary | ICD-10-CM | POA: Insufficient documentation

## 2019-08-29 LAB — CBC WITH DIFFERENTIAL (CANCER CENTER ONLY)
Abs Immature Granulocytes: 0.02 10*3/uL (ref 0.00–0.07)
Basophils Absolute: 0 10*3/uL (ref 0.0–0.1)
Basophils Relative: 1 %
Eosinophils Absolute: 0.1 10*3/uL (ref 0.0–0.5)
Eosinophils Relative: 2 %
HCT: 43.2 % (ref 36.0–46.0)
Hemoglobin: 14.1 g/dL (ref 12.0–15.0)
Immature Granulocytes: 0 %
Lymphocytes Relative: 29 %
Lymphs Abs: 1.4 10*3/uL (ref 0.7–4.0)
MCH: 28.2 pg (ref 26.0–34.0)
MCHC: 32.6 g/dL (ref 30.0–36.0)
MCV: 86.4 fL (ref 80.0–100.0)
Monocytes Absolute: 0.5 10*3/uL (ref 0.1–1.0)
Monocytes Relative: 10 %
Neutro Abs: 2.8 10*3/uL (ref 1.7–7.7)
Neutrophils Relative %: 58 %
Platelet Count: 163 10*3/uL (ref 150–400)
RBC: 5 MIL/uL (ref 3.87–5.11)
RDW: 12.5 % (ref 11.5–15.5)
WBC Count: 4.8 10*3/uL (ref 4.0–10.5)
nRBC: 0 % (ref 0.0–0.2)

## 2019-08-29 LAB — CMP (CANCER CENTER ONLY)
ALT: 12 U/L (ref 0–44)
AST: 15 U/L (ref 15–41)
Albumin: 4 g/dL (ref 3.5–5.0)
Alkaline Phosphatase: 67 U/L (ref 38–126)
Anion gap: 11 (ref 5–15)
BUN: 15 mg/dL (ref 8–23)
CO2: 30 mmol/L (ref 22–32)
Calcium: 9.4 mg/dL (ref 8.9–10.3)
Chloride: 103 mmol/L (ref 98–111)
Creatinine: 0.75 mg/dL (ref 0.44–1.00)
GFR, Est AFR Am: >60 mL/min (ref >60)
GFR, Estimated: >60 mL/min (ref >60)
Glucose, Bld: 125 mg/dL — ABNORMAL HIGH (ref 70–99)
Potassium: 3.7 mmol/L (ref 3.5–5.1)
Sodium: 144 mmol/L (ref 135–145)
Total Bilirubin: 0.7 mg/dL (ref 0.3–1.2)
Total Protein: 6.4 g/dL — ABNORMAL LOW (ref 6.5–8.1)

## 2019-08-29 NOTE — Telephone Encounter (Signed)
Scheduled appt per 10/15 los - pt aware of appt date and time

## 2019-08-29 NOTE — Progress Notes (Signed)
  [place] OFFICE PROGRESS NOTE   Diagnosis: Small lymphocytic lymphoma  INTERVAL HISTORY:   Ms. Rachel Coffey returns as scheduled.  She began ibrutinib on 08/14/2019.  She reports a few days of diarrhea, this has improved.  She has noted increased "gas ".  She had one episode of nausea and vomiting after drinking 17 ounces of water over a few minutes.  No consistent nausea.  She has noted a bruise of the dorsum of the left hand and she had a bruise beneath the right eye.  No other bleeding.  She has abdominal fullness and "discomfort ".  Objective:  Vital signs in last 24 hours:  Blood pressure (!) 134/52, pulse 86, temperature 98 F (36.7 C), temperature source Temporal, resp. rate 18, height _0  (1.702 m), weight 183 lb 12.8 oz (83.4 kg), SpO2 97 %.   Resp: Lungs clear bilaterally Cardio: Regular rate and rhythm GI: No mass, nontender, no hepatosplenomegaly Vascular: No leg edema  Skin: Small ecchymoses at the dorsum of the left hand and left forearm   Lab Results:  Lab Results  Component Value Date   WBC 4.8 08/29/2019   HGB 14.1 08/29/2019   HCT 43.2 08/29/2019   MCV 86.4 08/29/2019   PLT 163 08/29/2019   NEUTROABS 2.8 08/29/2019    CMP  Lab Results  Component Value Date   NA 144 08/29/2019   K 3.7 08/29/2019   CL 103 08/29/2019   CO2 30 08/29/2019   GLUCOSE 125 (H) 08/29/2019   BUN 15 08/29/2019   CREATININE 0.75 08/29/2019   CALCIUM 9.4 08/29/2019   PROT 6.4 (L) 08/29/2019   ALBUMIN 4.0 08/29/2019   AST 15 08/29/2019   ALT 12 08/29/2019   ALKPHOS 67 08/29/2019   BILITOT 0.7 08/29/2019   GFRNONAA >60 08/29/2019   GFRAA >60 08/29/2019     Medications: I have reviewed the patient's current medications.   Assessment/Plan: 1. 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 Monoclonal Kappa restricted B-cell population identified onflow cytometry ? Staging PET scan 06/13/2016 confirmed  hypermetabolic abdominal/pelvic lymphadenopathy ? FISHpanel positive for 11q-,-12,13q-, and 17p-(10% of cells) ? Peripheral blood flow cytometry 09/22/2016 at [facility]-CD5/CD23 positive For restricted B-cell population, 2% of lymphocytes ? Peripheral blood IGH mutation analysis at [facility] 09/22/2016-unmutated ? CT 06/06/2017-generally stable diffuse lymphadenopathy, node in the pelvis is smaller, some the lymph nodes are larger ? CTs 07/02/2018- slight decrease in size of retrocrural, retroperitoneal, and mesenteric adenopathy, new 6 mm left upper lobe nodule ? CT chest 01/01/2019- increase in size of left upper lobe lung nodule-inflammatory appearance, other stable lung nodules, right retrocrural and upper abdominal adenopathy has progressed ? CTs 07/04/2019- progressive lower thoracic and abdominal adenopathy with dominant mass-effect on the stomach and pancreas, new heterogeneity in the spleen- possible splenic lesion, stable lung nodules ? Ibrutinib starting 08/14/2019  2. Stage IB (T1b,N0) melanoma of the right forearm, status post a wide excision and sentinel lymph node biopsy at [facility] March 2017   3. Multiple sclerosis  4. Diabetes  5.   COPD      Disposition: Ms. [name] is tolerating the ibrutinib well.  She will call for new symptoms.  She will return for an office and lab visit in 3 weeks.  [name], MD  08/29/2019  9:59 AM

## 2019-08-30 ENCOUNTER — Encounter: Payer: Self-pay | Admitting: Oncology

## 2019-09-05 ENCOUNTER — Other Ambulatory Visit: Payer: Self-pay | Admitting: Oncology

## 2019-09-05 DIAGNOSIS — C9110 Chronic lymphocytic leukemia of B-cell type not having achieved remission: Secondary | ICD-10-CM

## 2019-09-06 MED FILL — IMBRUVICA 420 MG TAB: 420 | 28 days supply | Qty: 28 | Fill #0

## 2019-09-19 ENCOUNTER — Other Ambulatory Visit: Payer: Self-pay

## 2019-09-19 ENCOUNTER — Telehealth: Payer: Self-pay | Admitting: Oncology

## 2019-09-19 ENCOUNTER — Inpatient Hospital Stay: Payer: Medicare Other | Attending: Oncology | Admitting: Nurse Practitioner

## 2019-09-19 ENCOUNTER — Encounter: Payer: Self-pay | Admitting: Nurse Practitioner

## 2019-09-19 ENCOUNTER — Inpatient Hospital Stay: Payer: Medicare Other

## 2019-09-19 VITALS — BP 108/57 | HR 72 | Temp 98.5°F | Resp 17 | Ht 67.0 in | Wt 184.3 lb

## 2019-09-19 DIAGNOSIS — L989 Disorder of the skin and subcutaneous tissue, unspecified: Secondary | ICD-10-CM | POA: Diagnosis not present

## 2019-09-19 DIAGNOSIS — Z85820 Personal history of malignant melanoma of skin: Secondary | ICD-10-CM | POA: Diagnosis not present

## 2019-09-19 DIAGNOSIS — J449 Chronic obstructive pulmonary disease, unspecified: Secondary | ICD-10-CM | POA: Insufficient documentation

## 2019-09-19 DIAGNOSIS — C9110 Chronic lymphocytic leukemia of B-cell type not having achieved remission: Secondary | ICD-10-CM | POA: Diagnosis not present

## 2019-09-19 DIAGNOSIS — G35 Multiple sclerosis: Secondary | ICD-10-CM | POA: Insufficient documentation

## 2019-09-19 DIAGNOSIS — E119 Type 2 diabetes mellitus without complications: Secondary | ICD-10-CM | POA: Insufficient documentation

## 2019-09-19 LAB — CBC WITH DIFFERENTIAL (CANCER CENTER ONLY)
Abs Immature Granulocytes: 0.02 10*3/uL (ref 0.00–0.07)
Basophils Absolute: 0.1 10*3/uL (ref 0.0–0.1)
Basophils Relative: 1 %
Eosinophils Absolute: 0.1 10*3/uL (ref 0.0–0.5)
Eosinophils Relative: 1 %
HCT: 43.8 % (ref 36.0–46.0)
Hemoglobin: 14 g/dL (ref 12.0–15.0)
Immature Granulocytes: 0 %
Lymphocytes Relative: 23 %
Lymphs Abs: 1.6 10*3/uL (ref 0.7–4.0)
MCH: 28.3 pg (ref 26.0–34.0)
MCHC: 32 g/dL (ref 30.0–36.0)
MCV: 88.7 fL (ref 80.0–100.0)
Monocytes Absolute: 0.5 10*3/uL (ref 0.1–1.0)
Monocytes Relative: 8 %
Neutro Abs: 4.5 10*3/uL (ref 1.7–7.7)
Neutrophils Relative %: 67 %
Platelet Count: 150 10*3/uL (ref 150–400)
RBC: 4.94 MIL/uL (ref 3.87–5.11)
RDW: 12.8 % (ref 11.5–15.5)
WBC Count: 6.7 10*3/uL (ref 4.0–10.5)
nRBC: 0 % (ref 0.0–0.2)

## 2019-09-19 LAB — CMP (CANCER CENTER ONLY)
ALT: 16 U/L (ref 0–44)
AST: 16 U/L (ref 15–41)
Albumin: 4 g/dL (ref 3.5–5.0)
Alkaline Phosphatase: 60 U/L (ref 38–126)
Anion gap: 11 (ref 5–15)
BUN: 19 mg/dL (ref 8–23)
CO2: 28 mmol/L (ref 22–32)
Calcium: 9.3 mg/dL (ref 8.9–10.3)
Chloride: 103 mmol/L (ref 98–111)
Creatinine: 0.84 mg/dL (ref 0.44–1.00)
GFR, Est AFR Am: >60 mL/min (ref >60)
GFR, Estimated: >60 mL/min (ref >60)
Glucose, Bld: 125 mg/dL — ABNORMAL HIGH (ref 70–99)
Potassium: 4 mmol/L (ref 3.5–5.1)
Sodium: 142 mmol/L (ref 135–145)
Total Bilirubin: 0.7 mg/dL (ref 0.3–1.2)
Total Protein: 6.4 g/dL — ABNORMAL LOW (ref 6.5–8.1)

## 2019-09-19 NOTE — Progress Notes (Addendum)
  [place] OFFICE PROGRESS NOTE   Diagnosis: Small lymphocytic lymphoma  INTERVAL HISTORY:   Ms. Beachem returns as scheduled.  She continues ibrutinib.  No nausea or vomiting.  She has noted some improvement in baseline bowel habits of diarrhea.  No bruising or bleeding.  No mouth sores.  She feels like she may have sores in her nose.  She has recently noticed some skin lesions on the buttocks.  Objective:  Vital signs in last 24 hours:  Blood pressure (!) 108/57, pulse 72, temperature 98.5 F (36.9 C), temperature source Temporal, resp. rate 17, height _0  (1.702 m), weight 184 lb 4.8 oz (83.6 kg), SpO2 98 %.    HEENT: No thrush or ulcers.  No obvious lesion within the nasal passages. Lymphatics: No palpable cervical, supraclavicular, axillary or inguinal lymph nodes. Resp: Lungs clear bilaterally. Cardio: Regular rate and rhythm. GI: Abdomen soft and nontender.  No hepatosplenomegaly. Vascular: No leg edema. Neuro: Alert and oriented. Skin: Several erythematous raised lesions in the region of the upper inner buttocks, 1 with a small whitehead.  Lesions do not appear vesicular.   Lab Results:  Lab Results  Component Value Date   WBC 6.7 09/19/2019   HGB 14.0 09/19/2019   HCT 43.8 09/19/2019   MCV 88.7 09/19/2019   PLT 150 09/19/2019   NEUTROABS 4.5 09/19/2019    Imaging:  No results found.  Medications: I have reviewed the patient's current medications.  Assessment/Plan: 1. 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 Monoclonal Kappa restricted B-cell population identified onflow cytometry ? Staging PET scan 06/13/2016 confirmed hypermetabolic abdominal/pelvic lymphadenopathy ? FISHpanel positive for 11q-,-12,13q-, and 17p-(10% of cells) ? Peripheral blood flow cytometry 09/22/2016 at [facility]-CD5/CD23 positive For restricted B-cell population, 2% of lymphocytes ? Peripheral blood IGH mutation  analysis at [facility] 09/22/2016-unmutated ? CT 06/06/2017-generally stable diffuse lymphadenopathy, node in the pelvis is smaller, some the lymph nodes are larger ? CTs 07/02/2018- slight decrease in size of retrocrural, retroperitoneal, and mesenteric adenopathy, new 6 mm left upper lobe nodule ? CT chest 01/01/2019- increase in size of left upper lobe lung nodule-inflammatory appearance, other stable lung nodules, right retrocrural and upper abdominal adenopathy has progressed ? CTs 07/04/2019- progressive lower thoracic and abdominal adenopathy with dominant mass-effect on the stomach and pancreas, new heterogeneity in the spleen- possible splenic lesion, stable lung nodules ? Ibrutinib starting 08/14/2019  2. Stage IB (T1b,N0) melanoma of the right forearm, status post a wide excision and sentinel lymph node biopsy at [facility] March 2017   3. Multiple sclerosis  4. Diabetes  5.   COPD   Disposition: Ms. Silos appears stable.  She continues ibrutinib.  She seems to be tolerating it well.  We reviewed the CBC from today.  Counts adequate to continue.  The lesions at the buttocks are likely unrelated to ibrutinib.  She will try warm compresses and contact the office if she notes any increase.  She will return for lab and follow-up in 4 weeks.  Patient seen with Dr. Benay Spice.    Ned Card ANP/GNP-BC   09/19/2019  10:11 AM  This was a shared visit with Ned Card.  Ms. Scahill appears to be tolerating the ibrutinib well.  The skin lesions appear to be inflamed sebaceous or hair follicle cysts.  Julieanne Manson, MD

## 2019-09-19 NOTE — Telephone Encounter (Signed)
Scheduled per 11/05 los, patient will be updated by mychart.

## 2019-09-20 ENCOUNTER — Encounter: Payer: Self-pay | Admitting: Nurse Practitioner

## 2019-10-01 ENCOUNTER — Other Ambulatory Visit: Payer: Self-pay | Admitting: Oncology

## 2019-10-01 DIAGNOSIS — C9110 Chronic lymphocytic leukemia of B-cell type not having achieved remission: Secondary | ICD-10-CM

## 2019-10-03 ENCOUNTER — Telehealth: Payer: Self-pay | Admitting: Pharmacist

## 2019-10-03 DIAGNOSIS — M1990 Unspecified osteoarthritis, unspecified site: Secondary | ICD-10-CM

## 2019-10-03 NOTE — Telephone Encounter (Signed)
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  Original Start date of oral chemotherapy: 08/15/2019  Pt reports 0 tablets/doses of Imbruvica 480m tablets, 1 tablet (4281m by mouth once daily missed in the last month.   Pt reports the following side effects: intermittent sores in nares, no other issues  Pertinent labs reviewed: OK for continued treatemnt.  Other Issues: patient would like to start turmeric, moderate interaction with meloxicam due to anti-platelet properties but patient uses meloxicam very sparingly, she was informed about interaction, OK to start turmeric and I will add it to medication list.  All questions answered. Patient knows to call the office with questions or concerns.  JeJohny DrillingPharmD, BCPS, BCOP  10/03/2019 9:42 AM Oral Oncology Clinic [phone]

## 2019-10-07 MED FILL — IMBRUVICA 420 MG TAB: 420 | 28 days supply | Qty: 28 | Fill #0

## 2019-10-17 ENCOUNTER — Inpatient Hospital Stay (HOSPITAL_BASED_OUTPATIENT_CLINIC_OR_DEPARTMENT_OTHER): Payer: Medicare Other | Admitting: Oncology

## 2019-10-17 ENCOUNTER — Inpatient Hospital Stay: Payer: Medicare Other | Attending: Nurse Practitioner

## 2019-10-17 ENCOUNTER — Telehealth: Payer: Self-pay | Admitting: Oncology

## 2019-10-17 ENCOUNTER — Other Ambulatory Visit: Payer: Self-pay

## 2019-10-17 VITALS — BP 122/46 | HR 79 | Temp 98.2°F | Resp 18 | Ht 67.0 in | Wt 187.4 lb

## 2019-10-17 DIAGNOSIS — J449 Chronic obstructive pulmonary disease, unspecified: Secondary | ICD-10-CM | POA: Insufficient documentation

## 2019-10-17 DIAGNOSIS — Z85820 Personal history of malignant melanoma of skin: Secondary | ICD-10-CM | POA: Diagnosis not present

## 2019-10-17 DIAGNOSIS — G35 Multiple sclerosis: Secondary | ICD-10-CM | POA: Insufficient documentation

## 2019-10-17 DIAGNOSIS — R197 Diarrhea, unspecified: Secondary | ICD-10-CM | POA: Diagnosis not present

## 2019-10-17 DIAGNOSIS — E119 Type 2 diabetes mellitus without complications: Secondary | ICD-10-CM | POA: Diagnosis not present

## 2019-10-17 DIAGNOSIS — G8929 Other chronic pain: Secondary | ICD-10-CM | POA: Diagnosis not present

## 2019-10-17 DIAGNOSIS — C9110 Chronic lymphocytic leukemia of B-cell type not having achieved remission: Secondary | ICD-10-CM

## 2019-10-17 DIAGNOSIS — M549 Dorsalgia, unspecified: Secondary | ICD-10-CM | POA: Insufficient documentation

## 2019-10-17 LAB — CBC WITH DIFFERENTIAL (CANCER CENTER ONLY)
Abs Immature Granulocytes: 0.02 10*3/uL (ref 0.00–0.07)
Basophils Absolute: 0 10*3/uL (ref 0.0–0.1)
Basophils Relative: 1 %
Eosinophils Absolute: 0.1 10*3/uL (ref 0.0–0.5)
Eosinophils Relative: 1 %
HCT: 45.6 % (ref 36.0–46.0)
Hemoglobin: 14.4 g/dL (ref 12.0–15.0)
Immature Granulocytes: 0 %
Lymphocytes Relative: 27 %
Lymphs Abs: 1.7 10*3/uL (ref 0.7–4.0)
MCH: 28.2 pg (ref 26.0–34.0)
MCHC: 31.6 g/dL (ref 30.0–36.0)
MCV: 89.4 fL (ref 80.0–100.0)
Monocytes Absolute: 0.6 10*3/uL (ref 0.1–1.0)
Monocytes Relative: 9 %
Neutro Abs: 3.9 10*3/uL (ref 1.7–7.7)
Neutrophils Relative %: 62 %
Platelet Count: 155 10*3/uL (ref 150–400)
RBC: 5.1 MIL/uL (ref 3.87–5.11)
RDW: 12.7 % (ref 11.5–15.5)
WBC Count: 6.4 10*3/uL (ref 4.0–10.5)
nRBC: 0 % (ref 0.0–0.2)

## 2019-10-17 LAB — CMP (CANCER CENTER ONLY)
ALT: 14 U/L (ref 0–44)
AST: 17 U/L (ref 15–41)
Albumin: 4 g/dL (ref 3.5–5.0)
Alkaline Phosphatase: 64 U/L (ref 38–126)
Anion gap: 5 (ref 5–15)
BUN: 15 mg/dL (ref 8–23)
CO2: 32 mmol/L (ref 22–32)
Calcium: 9.2 mg/dL (ref 8.9–10.3)
Chloride: 104 mmol/L (ref 98–111)
Creatinine: 0.81 mg/dL (ref 0.44–1.00)
GFR, Est AFR Am: >60 mL/min (ref >60)
GFR, Estimated: >60 mL/min (ref >60)
Glucose, Bld: 140 mg/dL — ABNORMAL HIGH (ref 70–99)
Potassium: 3.8 mmol/L (ref 3.5–5.1)
Sodium: 141 mmol/L (ref 135–145)
Total Bilirubin: 0.6 mg/dL (ref 0.3–1.2)
Total Protein: 6.3 g/dL — ABNORMAL LOW (ref 6.5–8.1)

## 2019-10-17 NOTE — Progress Notes (Signed)
  [place] OFFICE PROGRESS NOTE   Diagnosis: CLL  INTERVAL HISTORY:   Rachel Coffey returns as scheduled.  She continues ibrutinib.  She has noted improvement in diarrhea since beginning ibrutinib.  She has easy bruising.  No rash.  She has chronic back pain.  She has experienced several "ulcers "in the nose.  Objective:  Vital signs in last 24 hours:  Blood pressure (!) 122/46, pulse 79, temperature 98.2 F (36.8 C), temperature source Temporal, resp. rate 18, height '5\' 7"'$  (1.702 m), weight 187 lb 6.4 oz (85 kg), SpO2 99 %.    Limited physical examination secondary to distancing with the Covid pandemic GI: No hepatosplenomegaly, no mass, nontender Vascular: No leg edema  Skin: Small ecchymoses at the dorsum of the hand/distal forearms    Lab Results:  Lab Results  Component Value Date   WBC 6.4 10/17/2019   HGB 14.4 10/17/2019   HCT 45.6 10/17/2019   MCV 89.4 10/17/2019   PLT 155 10/17/2019   NEUTROABS 3.9 10/17/2019    CMP  Lab Results  Component Value Date   NA 142 09/19/2019   K 4.0 09/19/2019   CL 103 09/19/2019   CO2 28 09/19/2019   GLUCOSE 125 (H) 09/19/2019   BUN 19 09/19/2019   CREATININE 0.84 09/19/2019   CALCIUM 9.3 09/19/2019   PROT 6.4 (L) 09/19/2019   ALBUMIN 4.0 09/19/2019   AST 16 09/19/2019   ALT 16 09/19/2019   ALKPHOS 60 09/19/2019   BILITOT 0.7 09/19/2019   GFRNONAA >60 09/19/2019   GFRAA >60 09/19/2019     Medications: I have reviewed the patient's current medications.   Assessment/Plan: 1. 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 Monoclonal Kappa restricted B-cell population identified onflow cytometry ? Staging PET scan 06/13/2016 confirmed hypermetabolic abdominal/pelvic lymphadenopathy ? FISHpanel positive for 11q-,-12,13q-, and 17p-(10% of cells) ? Peripheral blood flow cytometry 09/22/2016 at [facility]-CD5/CD23 positive For restricted B-cell population, 2%  of lymphocytes ? Peripheral blood IGH mutation analysis at [facility] 09/22/2016-unmutated ? CT 06/06/2017-generally stable diffuse lymphadenopathy, node in the pelvis is smaller, some the lymph nodes are larger ? CTs 07/02/2018- slight decrease in size of retrocrural, retroperitoneal, and mesenteric adenopathy, new 6 mm left upper lobe nodule ? CT chest 01/01/2019- increase in size of left upper lobe lung nodule-inflammatory appearance, other stable lung nodules, right retrocrural and upper abdominal adenopathy has progressed ? CTs 07/04/2019- progressive lower thoracic and abdominal adenopathy with dominant mass-effect on the stomach and pancreas, new heterogeneity in the spleen- possible splenic lesion, stable lung nodules ? Ibrutinib starting 08/14/2019  2. Stage IB (T1b,N0) melanoma of the right forearm, status post a wide excision and sentinel lymph node biopsy at [facility] March 2017   3. Multiple sclerosis  4. Diabetes  5.   COPD    Disposition: [name] appears stable.  She is tolerating the ibrutinib well.  The easy bruising may be related to ibrutinib.  She will continue ibrutinib at current dose.  She will undergo a restaging CT evaluation prior to an office visit in 1 month.  [name], MD  10/17/2019  9:54 AM

## 2019-10-17 NOTE — Telephone Encounter (Signed)
Patient decline avs and calendar °

## 2019-10-25 ENCOUNTER — Telehealth: Payer: Self-pay

## 2019-10-25 NOTE — Telephone Encounter (Signed)
Oral Oncology Patient Advocate Encounter  Was successful in securing patient a $8000 grant from [employer] to provide copayment coverage for Imbruvica.  This will keep the out of pocket expense at $0.     [employer] ID: [id number]  I have spoken with the patient.   The billing information is as follows and has been shared with [place].    RxBin: [id number] PCN: [id number] Member ID: [id number] Group ID: [id number] Dates of Eligibility: 10/25/19 through 09/23/20  [place] Patient [place] Phone [phone] Fax [phone] 10/25/2019 2:26 PM

## 2019-10-28 ENCOUNTER — Other Ambulatory Visit: Payer: Self-pay | Admitting: Oncology

## 2019-10-28 DIAGNOSIS — C9110 Chronic lymphocytic leukemia of B-cell type not having achieved remission: Secondary | ICD-10-CM

## 2019-10-31 ENCOUNTER — Other Ambulatory Visit: Payer: Self-pay | Admitting: Oncology

## 2019-10-31 DIAGNOSIS — C9110 Chronic lymphocytic leukemia of B-cell type not having achieved remission: Secondary | ICD-10-CM

## 2019-10-31 MED FILL — IMBRUVICA 420 MG TAB: 420 | 28 days supply | Qty: 28 | Fill #0

## 2019-11-08 ENCOUNTER — Encounter: Payer: Self-pay | Admitting: Oncology

## 2019-11-11 ENCOUNTER — Telehealth: Payer: Self-pay

## 2019-11-11 NOTE — Telephone Encounter (Signed)
Left voicemail for patient to call back [place]. Was calling to see if pt had went and saw a podiatrist already?

## 2019-11-12 ENCOUNTER — Other Ambulatory Visit: Payer: Self-pay

## 2019-11-12 ENCOUNTER — Encounter: Payer: Self-pay | Admitting: Oncology

## 2019-11-12 ENCOUNTER — Ambulatory Visit (INDEPENDENT_AMBULATORY_CARE_PROVIDER_SITE_OTHER): Payer: Medicare Other | Admitting: Podiatry

## 2019-11-12 ENCOUNTER — Encounter: Payer: Self-pay | Admitting: Podiatry

## 2019-11-12 VITALS — BP 128/66

## 2019-11-12 DIAGNOSIS — L03032 Cellulitis of left toe: Secondary | ICD-10-CM

## 2019-11-12 DIAGNOSIS — L600 Ingrowing nail: Secondary | ICD-10-CM | POA: Diagnosis not present

## 2019-11-12 DIAGNOSIS — M79672 Pain in left foot: Secondary | ICD-10-CM

## 2019-11-12 MED ORDER — DOXYCYCLINE HYCLATE 100 MG PO TABS: 100.0000 mg | ORAL_TABLET | Freq: Two times a day (BID) | ORAL | 0 refills | Status: DC

## 2019-11-12 NOTE — Patient Instructions (Signed)

## 2019-11-12 NOTE — Progress Notes (Signed)
Subjective:  Patient ID: Rachel Coffey, female    DOB: 1951/05/18,  MRN: [id number]  Chief Complaint  Patient presents with  . Ingrown Toenail    pt has a possible ingrown of the left big toenail lateral side, going on for baout 2 plus weeks, pt also states that the right 4th toenail is hurting as well    68 y.o. female presents with the above complaint.  Patient presents with ingrown of the third toenail lateral border with paronychia surrounding the nail.  Patient states she is a diabetic with last A1c of 6.4.  She states that she has tried soaking it has been going on for 2 weeks.  She states this has been swelling as well as possible pus formation.  She states is hurts when she is ambulating in the tight shoe gear.  She denies any other acute complaints.  She denies taking antibiotics for this.  She denies seeing any other podiatrist for this.   Review of Systems: Negative except as noted in the HPI. Denies N/V/F/Ch.  Past Medical History:  Diagnosis Date  . Asthma    stress induced with allergies  . Diabetes mellitus without complication ([place])   . Genital warts   . Hyperlipidemia   . MS (multiple sclerosis) ([place])   . Neuromuscular disorder ([place])    dx. "68- MS" -Dr. Barkley Boards, [place](Neuropathy and "band around the chest"  . STD (sexually transmitted disease)    genital warts--years ago    Current Outpatient Medications:  .  albuterol (PROVENTIL HFA;VENTOLIN HFA) 108 (90 BASE) MCG/ACT inhaler, Inhale 2 puffs into the lungs every 6 (six) hours as needed for wheezing., Disp: , Rfl:  .  ALPRAZolam (XANAX) 0.5 MG tablet, Take 0.5 mg by mouth daily as needed (for pain). , Disp: , Rfl:  .  cholecalciferol (VITAMIN D) 1000 UNITS tablet, Take 2,000 Units by mouth daily. , Disp: , Rfl:  .  CINNAMON PO, Take 1,000 mg by mouth daily. , Disp: , Rfl:  .  cyclobenzaprine (FLEXERIL) 10 MG tablet, Take 10 mg by mouth 3 (three) times daily as needed for muscle spasms., Disp: , Rfl:  .   esomeprazole (NEXIUM) 20 MG capsule, Take 20 mg by mouth daily at 12 noon. , Disp: , Rfl:  .  fluticasone (FLONASE) 50 MCG/ACT nasal spray, Place 1 spray into both nostrils daily. Sometimes bid, Disp: , Rfl:  .  glipiZIDE (GLUCOTROL XL) 2.5 MG 24 hr tablet, Take 2.5 mg by mouth 2 (two) times daily. , Disp: , Rfl:  .  IMBRUVICA 420 MG TABS, TAKE 1 TABLET (420 MG) BY MOUTH DAILY. ADMINISTER WITH WATER AT APPROXIMATELY THE SAME TIME EACH DAY., Disp: 28 tablet, Rfl: 0 .  Loratadine 10 MG CAPS, Take 10 mg by mouth daily as needed., Disp: , Rfl:  .  losartan (COZAAR) 25 MG tablet, Take 25 mg by mouth daily., Disp: , Rfl:  .  meloxicam (MOBIC) 15 MG tablet, Take 15 mg by mouth daily. Prn, Disp: , Rfl:  .  Probiotic Product (ACIDOPHILUS/BIFIDUS PO), Take 1 tablet by mouth daily., Disp: , Rfl:  .  simvastatin (ZOCOR) 20 MG tablet, Take 20 mg by mouth at bedtime., Disp: , Rfl:  .  doxycycline (VIBRA-TABS) 100 MG tablet, Take 1 tablet (100 mg total) by mouth 2 (two) times daily., Disp: 20 tablet, Rfl: 0  Social History   Tobacco Use  Smoking Status Former Smoker  . Types: Cigarettes  . Quit date: 01/31/2005  . Years  since quitting: 14.7  Smokeless Tobacco Never Used    Allergies  Allergen Reactions  . Codeine Itching  . Fenofibrate     aching joints  . Hydrocodone Itching  . Metformin And Related    Objective:   Vitals:   11/12/19 0902  BP: 128/66   There is no height or weight on file to calculate BMI. Constitutional Well developed. Well nourished.  Vascular Dorsalis pedis pulses palpable bilaterally. Posterior tibial pulses palpable bilaterally. Capillary refill normal to all digits.  No cyanosis or clubbing noted. Pedal hair growth normal.  Neurologic Normal speech. Oriented to person, place, and time. Epicritic sensation to light touch grossly present bilaterally.  Dermatologic Painful ingrowing nail at lateral nail borders of the third toe nail left.  Mild erythema noted around  the third toenail site. No other open wounds. No skin lesions.  Orthopedic: Normal joint ROM without pain or crepitus bilaterally. No visible deformities. No bony tenderness.   Radiographs: None Assessment:   1. Ingrown nail of third toe of left foot   2. Paronychia of toe of left foot due to ingrown toenail   3. Pain in left foot    Plan:  Patient was evaluated and treated and all questions answered.  Ingrown Nail, left -Patient elects to proceed with minor surgery to remove ingrown toenail removal today. Consent reviewed and signed by patient. -Ingrown nail excised. See procedure note. -Educated on post-procedure care including soaking. Written instructions provided and reviewed. -Patient to follow up in 2 weeks for nail check. -Doxycycline was dispensed for skin and soft tissue infection prophylaxis.  Procedure: Excision of Ingrown Toenail Location: Left 3rd toe lateral nail borders. Anesthesia: Lidocaine 1% plain; 1.5 mL and Marcaine 0.5% plain; 1.5 mL, digital block. Skin Prep: Betadine. Dressing: Silvadene; telfa; dry, sterile, compression dressing. Technique: Following skin prep, the toe was exsanguinated and a tourniquet was secured at the base of the toe. The affected nail border was freed, split with a nail splitter, and excised. Chemical matrixectomy was then performed with phenol and irrigated out with alcohol. The tourniquet was then removed and sterile dressing applied. Disposition: Patient tolerated procedure well. Patient to return in 2 weeks for follow-up.   No follow-ups on file.

## 2019-11-13 ENCOUNTER — Encounter: Payer: Self-pay | Admitting: *Deleted

## 2019-11-19 ENCOUNTER — Inpatient Hospital Stay: Payer: Medicare Other | Attending: Nurse Practitioner

## 2019-11-19 ENCOUNTER — Other Ambulatory Visit: Payer: Self-pay

## 2019-11-19 ENCOUNTER — Ambulatory Visit (HOSPITAL_COMMUNITY)
Admission: RE | Admit: 2019-11-19 | Discharge: 2019-11-19 | Disposition: A | Discharge status: Home / Self Care | Payer: Medicare Other | Source: Ambulatory Visit | Attending: Oncology | Admitting: Oncology

## 2019-11-19 DIAGNOSIS — E119 Type 2 diabetes mellitus without complications: Secondary | ICD-10-CM | POA: Insufficient documentation

## 2019-11-19 DIAGNOSIS — C9110 Chronic lymphocytic leukemia of B-cell type not having achieved remission: Secondary | ICD-10-CM | POA: Insufficient documentation

## 2019-11-19 DIAGNOSIS — R21 Rash and other nonspecific skin eruption: Secondary | ICD-10-CM | POA: Insufficient documentation

## 2019-11-19 DIAGNOSIS — R590 Localized enlarged lymph nodes: Secondary | ICD-10-CM | POA: Diagnosis not present

## 2019-11-19 DIAGNOSIS — K5730 Diverticulosis of large intestine without perforation or abscess without bleeding: Secondary | ICD-10-CM | POA: Insufficient documentation

## 2019-11-19 DIAGNOSIS — Z85820 Personal history of malignant melanoma of skin: Secondary | ICD-10-CM | POA: Diagnosis not present

## 2019-11-19 DIAGNOSIS — J449 Chronic obstructive pulmonary disease, unspecified: Secondary | ICD-10-CM | POA: Diagnosis not present

## 2019-11-19 DIAGNOSIS — G35 Multiple sclerosis: Secondary | ICD-10-CM | POA: Insufficient documentation

## 2019-11-19 DIAGNOSIS — I700 Atherosclerosis of aorta: Secondary | ICD-10-CM | POA: Insufficient documentation

## 2019-11-19 LAB — CMP (CANCER CENTER ONLY)
ALT: 16 U/L (ref 0–44)
AST: 18 U/L (ref 15–41)
Albumin: 4.2 g/dL (ref 3.5–5.0)
Alkaline Phosphatase: 57 U/L (ref 38–126)
Anion gap: 7 (ref 5–15)
BUN: 17 mg/dL (ref 8–23)
CO2: 32 mmol/L (ref 22–32)
Calcium: 9.8 mg/dL (ref 8.9–10.3)
Chloride: 102 mmol/L (ref 98–111)
Creatinine: 0.67 mg/dL (ref 0.44–1.00)
GFR, Est AFR Am: >60 mL/min (ref >60)
GFR, Estimated: >60 mL/min (ref >60)
Glucose, Bld: 116 mg/dL — ABNORMAL HIGH (ref 70–99)
Potassium: 4.4 mmol/L (ref 3.5–5.1)
Sodium: 141 mmol/L (ref 135–145)
Total Bilirubin: 0.9 mg/dL (ref 0.3–1.2)
Total Protein: 6.5 g/dL (ref 6.5–8.1)

## 2019-11-19 LAB — CBC WITH DIFFERENTIAL (CANCER CENTER ONLY)
Abs Immature Granulocytes: 0.01 10*3/uL (ref 0.00–0.07)
Basophils Absolute: 0.1 10*3/uL (ref 0.0–0.1)
Basophils Relative: 1 %
Eosinophils Absolute: 0.1 10*3/uL (ref 0.0–0.5)
Eosinophils Relative: 1 %
HCT: 45.2 % (ref 36.0–46.0)
Hemoglobin: 14.4 g/dL (ref 12.0–15.0)
Immature Granulocytes: 0 %
Lymphocytes Relative: 29 %
Lymphs Abs: 1.9 10*3/uL (ref 0.7–4.0)
MCH: 28.2 pg (ref 26.0–34.0)
MCHC: 31.9 g/dL (ref 30.0–36.0)
MCV: 88.5 fL (ref 80.0–100.0)
Monocytes Absolute: 0.6 10*3/uL (ref 0.1–1.0)
Monocytes Relative: 9 %
Neutro Abs: 3.8 10*3/uL (ref 1.7–7.7)
Neutrophils Relative %: 60 %
Platelet Count: 145 10*3/uL — ABNORMAL LOW (ref 150–400)
RBC: 5.11 MIL/uL (ref 3.87–5.11)
RDW: 12.7 % (ref 11.5–15.5)
WBC Count: 6.4 10*3/uL (ref 4.0–10.5)
nRBC: 0 % (ref 0.0–0.2)

## 2019-11-19 MED ORDER — SODIUM CHLORIDE (PF) 0.9 % IJ SOLN
INTRAMUSCULAR | Status: AC
  Filled 2019-11-19: qty 50

## 2019-11-19 MED ORDER — IOHEXOL 300 MG/ML  SOLN
100.0000 mL | Freq: Once | INTRAMUSCULAR | Status: AC | PRN
  Administered 2019-11-19: 100 mL via INTRAVENOUS

## 2019-11-21 ENCOUNTER — Other Ambulatory Visit: Payer: Self-pay

## 2019-11-21 ENCOUNTER — Inpatient Hospital Stay (HOSPITAL_BASED_OUTPATIENT_CLINIC_OR_DEPARTMENT_OTHER): Payer: Medicare Other | Admitting: Oncology

## 2019-11-21 ENCOUNTER — Encounter: Payer: Self-pay | Admitting: Oncology

## 2019-11-21 VITALS — BP 130/62 | HR 75 | Temp 98.7°F | Resp 16 | Ht 67.0 in | Wt 190.6 lb

## 2019-11-21 DIAGNOSIS — K5730 Diverticulosis of large intestine without perforation or abscess without bleeding: Secondary | ICD-10-CM | POA: Diagnosis not present

## 2019-11-21 DIAGNOSIS — I700 Atherosclerosis of aorta: Secondary | ICD-10-CM | POA: Diagnosis not present

## 2019-11-21 DIAGNOSIS — R21 Rash and other nonspecific skin eruption: Secondary | ICD-10-CM | POA: Diagnosis not present

## 2019-11-21 DIAGNOSIS — G35 Multiple sclerosis: Secondary | ICD-10-CM | POA: Diagnosis not present

## 2019-11-21 DIAGNOSIS — C9110 Chronic lymphocytic leukemia of B-cell type not having achieved remission: Secondary | ICD-10-CM | POA: Diagnosis not present

## 2019-11-21 DIAGNOSIS — R590 Localized enlarged lymph nodes: Secondary | ICD-10-CM | POA: Diagnosis not present

## 2019-11-21 NOTE — Progress Notes (Signed)
[facility] OFFICE PROGRESS NOTE   Diagnosis: CLL  INTERVAL HISTORY:   Ms. Thang returns as scheduled.  She continues ibrutinib.  No nausea or diarrhea.  She developed an infected toenail of the left third toe.  She underwent partial removal of the toenail and is completing treatment with doxycycline.  The toe has improved.  She has a mild rash at the left lower leg.  She had an episode of palpitations and dizziness approximately 2 weeks ago.  This resolved spontaneously after approximately 15 minutes.  She has a previous history of palpitations.  Objective:  Vital signs in last 24 hours:  Blood pressure 130/62, pulse 75, temperature 98.7 F (37.1 C), temperature source Temporal, resp. rate 16, height _0  (1.702 m), weight 190 lb 9.6 oz (86.5 kg), SpO2 96 %.    Physical examination secondary to distancing with the Covid pandemic  Cardio: Regular rate and rhythm GI: No mass, no hepatosplenomegaly, nontender Vascular: No leg edema  Skin: Mild erythema surrounding the distal aspect of the left third toe/nail.  No discharge.   Lab Results:  Lab Results  Component Value Date   WBC 6.4 11/19/2019   HGB 14.4 11/19/2019   HCT 45.2 11/19/2019   MCV 88.5 11/19/2019   PLT 145 (L) 11/19/2019   NEUTROABS 3.8 11/19/2019    CMP  Lab Results  Component Value Date   NA 141 11/19/2019   K 4.4 11/19/2019   CL 102 11/19/2019   CO2 32 11/19/2019   GLUCOSE 116 (H) 11/19/2019   BUN 17 11/19/2019   CREATININE 0.67 11/19/2019   CALCIUM 9.8 11/19/2019   PROT 6.5 11/19/2019   ALBUMIN 4.2 11/19/2019   AST 18 11/19/2019   ALT 16 11/19/2019   ALKPHOS 57 11/19/2019   BILITOT 0.9 11/19/2019   GFRNONAA >60 11/19/2019   GFRAA >60 11/19/2019     Imaging:  CT Abdomen Pelvis W Contrast  Result Date: 11/19/2019 CLINICAL DATA:  Restaging chronic lymphocytic lymphoma. EXAM: CT ABDOMEN AND PELVIS WITH CONTRAST TECHNIQUE: Multidetector CT imaging of the abdomen and pelvis  was performed using the standard protocol following bolus administration of intravenous contrast. CONTRAST:  121m OMNIPAQUE IOHEXOL 300 MG/ML  SOLN COMPARISON:  07/04/2019 FINDINGS: Lower chest: No acute abnormality. Hepatobiliary: Unchanged scattered low-attenuation liver foci which likely represent multiple cysts. Previous cholecystectomy. No biliary ductal dilatation. Pancreas: Unremarkable. No pancreatic ductal dilatation or surrounding inflammatory changes. Spleen: Normal in size without focal abnormality. Adrenals/Urinary Tract: Normal appearance of the adrenal glands. The kidneys are unremarkable. No mass or hydronephrosis identified. The urinary bladder appears normal. Stomach/Bowel: Stomach is within normal limits. No evidence of bowel wall thickening, distention, or inflammatory changes. Distal colonic diverticulosis. Vascular/Lymphatic: Aortic atherosclerosis. No aneurysm. Nodal mass within the gastrohepatic and porta hepatic region measures 2.0 x 5.3 cm, image 25/2. 11.2 x 5.1 cm previously. Index portacaval node measures 0.7 cm, image 28/2. Previously 2.3 cm. Index left periaortic lymph node measures 1 cm, image 38/2. Previously 1.9 cm. Index mesenteric lymph node measures 1.1 cm, image 40/2. Previously 1.5 cm. Reproductive: Status post hysterectomy. No adnexal masses. Other: No free fluid or fluid collections Musculoskeletal: No acute or significant osseous findings. IMPRESSION: 1. Interval response to therapy with decrease in volume of abdominal adenopathy. 2. No new or progressive disease identified. 3. Aortic atherosclerosis. Aortic Atherosclerosis (ICD10-I70.0). Electronically Signed   By: TKerby MoorsM.D.   On: 11/19/2019 14:28    Medications: I have reviewed the patient's current medications.   Assessment/Plan:  1. 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 Monoclonal Kappa restricted B-cell population identified onflow  cytometry ? Staging PET scan 06/13/2016 confirmed hypermetabolic abdominal/pelvic lymphadenopathy ? FISHpanel positive for 11q-,-12,13q-, and 17p-(10% of cells) ? Peripheral blood flow cytometry 09/22/2016 at [facility]-CD5/CD23 positive For restricted B-cell population, 2% of lymphocytes ? Peripheral blood IGH mutation analysis at [facility] 09/22/2016-unmutated ? CT 06/06/2017-generally stable diffuse lymphadenopathy, node in the pelvis is smaller, some the lymph nodes are larger ? CTs 07/02/2018- slight decrease in size of retrocrural, retroperitoneal, and mesenteric adenopathy, new 6 mm left upper lobe nodule ? CT chest 01/01/2019- increase in size of left upper lobe lung nodule-inflammatory appearance, other stable lung nodules, right retrocrural and upper abdominal adenopathy has progressed ? CTs 07/04/2019- progressive lower thoracic and abdominal adenopathy with dominant mass-effect on the stomach and pancreas, new heterogeneity in the spleen- possible splenic lesion, stable lung nodules ? Ibrutinib starting 08/14/2019 ? CT abdomen/pelvis 11/19/2019-decreased size of dominant abdominal mass and decreased abdominal lymphadenopathy  2. Stage IB (T1b,N0) melanoma of the right forearm, status post a wide excision and sentinel lymph node biopsy at [facility] March 2017   3. Multiple sclerosis  4. Diabetes  5.   COPD  Disposition: Rachel Coffey has chronic lymphocytic leukemia.  She has been maintained on ibrutinib since 08/14/2019.  She is tolerating the ibrutinib well and the restaging CT reveals significant clinical improvement.  She will continue ibrutinib.  She will call for recurrent palpitations.  [name] will obtain a COVID-19 vaccine as soon as available.  She will return for an office and lab visit in 2 months.  We reviewed the CT images.  [name], MD  11/21/2019  8:58 AM

## 2019-11-22 ENCOUNTER — Telehealth: Payer: Self-pay | Admitting: Oncology

## 2019-11-22 NOTE — Telephone Encounter (Signed)
Scheduled per los. Called and spoke with patient. Confirmed appt 

## 2019-11-26 ENCOUNTER — Encounter: Payer: Self-pay | Admitting: Podiatry

## 2019-11-26 ENCOUNTER — Other Ambulatory Visit: Payer: Self-pay

## 2019-11-26 ENCOUNTER — Other Ambulatory Visit: Payer: Self-pay | Admitting: Oncology

## 2019-11-26 ENCOUNTER — Ambulatory Visit (INDEPENDENT_AMBULATORY_CARE_PROVIDER_SITE_OTHER): Payer: Medicare Other | Admitting: Podiatry

## 2019-11-26 DIAGNOSIS — M79672 Pain in left foot: Secondary | ICD-10-CM

## 2019-11-26 DIAGNOSIS — C9110 Chronic lymphocytic leukemia of B-cell type not having achieved remission: Secondary | ICD-10-CM

## 2019-11-26 DIAGNOSIS — L600 Ingrowing nail: Secondary | ICD-10-CM | POA: Diagnosis not present

## 2019-11-26 NOTE — Progress Notes (Signed)
Subjective: Rachel Coffey is a 69 y.o.  female returns to office today for follow up evaluation after having left third toe Lateral border nail avulsion performed. Patient has been soaking using epsom salt and applying topical antibiotic covered with bandaid daily. Patient denies fevers, chills, nausea, vomiting. Denies any calf pain, chest pain, SOB.   Objective:  Vitals: Reviewed  General: Well developed, nourished, in no acute distress, alert and oriented x3   Dermatology: Skin is warm, dry and supple bilateral. Lateral third toe nail border appears to be clean, dry, with mild granular tissue and surrounding scab. There is no surrounding erythema, edema, drainage/purulence. The remaining nails appear unremarkable at this time. There are no other lesions or other signs of infection present.  Neurovascular status: Intact. No lower extremity swelling; No pain with calf compression bilateral.  Musculoskeletal: Decreased tenderness to palpation of the Lateral third toe nail fold(s). Muscular strength within normal limits bilateral.   Assesement and Plan: S/p partial nail avulsion, doing well.   -Continue soaking in epsom salts twice a day followed by antibiotic ointment and a band-aid. Can leave uncovered at night. Continue this until completely healed.  -If the area has not healed in 2 weeks, call the office for follow-up appointment, or sooner if any problems arise.  -Monitor for any signs/symptoms of infection. Call the office immediately if any occur or go directly to the emergency room. Call with any questions/concerns.  Boneta Lucks, DPM

## 2019-11-28 MED FILL — IMBRUVICA 420 MG TAB: 420 | 28 days supply | Qty: 28 | Fill #0

## 2019-12-05 ENCOUNTER — Encounter: Payer: Self-pay | Admitting: Oncology

## 2019-12-05 DIAGNOSIS — Z23 Encounter for immunization: Secondary | ICD-10-CM | POA: Diagnosis not present

## 2019-12-19 ENCOUNTER — Encounter: Payer: Self-pay | Admitting: Podiatry

## 2019-12-19 ENCOUNTER — Other Ambulatory Visit: Payer: Self-pay

## 2019-12-19 ENCOUNTER — Ambulatory Visit (INDEPENDENT_AMBULATORY_CARE_PROVIDER_SITE_OTHER): Payer: Medicare Other | Admitting: Podiatry

## 2019-12-19 DIAGNOSIS — L03032 Cellulitis of left toe: Secondary | ICD-10-CM | POA: Diagnosis not present

## 2019-12-19 DIAGNOSIS — M79672 Pain in left foot: Secondary | ICD-10-CM | POA: Diagnosis not present

## 2019-12-19 MED ORDER — DOXYCYCLINE HYCLATE 100 MG PO TABS: 100.0000 mg | ORAL_TABLET | Freq: Two times a day (BID) | ORAL | 0 refills | Status: DC

## 2019-12-20 ENCOUNTER — Other Ambulatory Visit: Payer: Self-pay | Admitting: Oncology

## 2019-12-20 ENCOUNTER — Encounter: Payer: Self-pay | Admitting: Podiatry

## 2019-12-20 DIAGNOSIS — C9110 Chronic lymphocytic leukemia of B-cell type not having achieved remission: Secondary | ICD-10-CM

## 2019-12-20 NOTE — Progress Notes (Signed)
Subjective:  Patient ID: Rachel Coffey, female    DOB: September 21, 1951,  MRN: [id number]  Chief Complaint  Patient presents with  . Ingrown Toenail    Patient presents today for concern for infected 3rd left toenail after nail avulsion    69 y.o. female presents with the above complaint.  Patient presents with erythema of the left third digit without any purulent drainage.  Patient states the erythema started couple of days ago when she woke up.  She states that there was throbbing pain this morning.  The toe is pretty red and sometimes draining and is very tender to touch.  She denies any other systemic signs of infection.  She had a partial toenail avulsion of the third digit due to ingrown done by me couple of months ago.  She was discharged from my care because the toenail was improving.  However he is being treated for cancer and in the setting of diabetes the healing is and has been little bit difficult.  She denies any other acute complaints.   Review of Systems: Negative except as noted in the HPI. Denies N/V/F/Ch.  Past Medical History:  Diagnosis Date  . Asthma    stress induced with allergies  . Diabetes mellitus without complication ([place])   . Genital warts   . Hyperlipidemia   . MS (multiple sclerosis) ([place])   . Neuromuscular disorder ([place])    dx. "40- MS" -Dr. Barkley Boards, [place](Neuropathy and "band around the chest"  . STD (sexually transmitted disease)    genital warts--years ago    Current Outpatient Medications:  .  albuterol (PROVENTIL HFA;VENTOLIN HFA) 108 (90 BASE) MCG/ACT inhaler, Inhale 2 puffs into the lungs every 6 (six) hours as needed for wheezing., Disp: , Rfl:  .  ALPRAZolam (XANAX) 0.5 MG tablet, Take 0.5 mg by mouth daily as needed (for pain). , Disp: , Rfl:  .  cholecalciferol (VITAMIN D) 1000 UNITS tablet, Take 2,000 Units by mouth daily. , Disp: , Rfl:  .  CINNAMON PO, Take 1,000 mg by mouth daily. , Disp: , Rfl:  .  cyclobenzaprine (FLEXERIL) 10 MG  tablet, Take 10 mg by mouth 3 (three) times daily as needed for muscle spasms., Disp: , Rfl:  .  doxycycline (VIBRA-TABS) 100 MG tablet, Take 1 tablet (100 mg total) by mouth 2 (two) times daily., Disp: 14 tablet, Rfl: 0 .  esomeprazole (NEXIUM) 20 MG capsule, Take 20 mg by mouth daily at 12 noon. , Disp: , Rfl:  .  fluticasone (FLONASE) 50 MCG/ACT nasal spray, Place 1 spray into both nostrils daily. Sometimes bid, Disp: , Rfl:  .  glipiZIDE (GLUCOTROL XL) 2.5 MG 24 hr tablet, Take 2.5 mg by mouth 2 (two) times daily. , Disp: , Rfl:  .  IMBRUVICA 420 MG TABS, TAKE 1 TABLET (420 MG) BY MOUTH DAILY. ADMINISTER WITH WATER AT APPROXIMATELY THE SAME TIME EACH DAY., Disp: 28 tablet, Rfl: 0 .  Loratadine 10 MG CAPS, Take 10 mg by mouth daily as needed., Disp: , Rfl:  .  losartan (COZAAR) 25 MG tablet, Take 25 mg by mouth daily., Disp: , Rfl:  .  meloxicam (MOBIC) 15 MG tablet, Take 15 mg by mouth daily. Prn, Disp: , Rfl:  .  Probiotic Product (ACIDOPHILUS/BIFIDUS PO), Take 1 tablet by mouth daily., Disp: , Rfl:  .  simvastatin (ZOCOR) 20 MG tablet, Take 20 mg by mouth at bedtime., Disp: , Rfl:   Social History   Tobacco Use  Smoking Status Former  Smoker  . Types: Cigarettes  . Quit date: 01/31/2005  . Years since quitting: 14.8  Smokeless Tobacco Never Used    Allergies  Allergen Reactions  . Codeine Itching  . Fenofibrate     aching joints  . Hydrocodone Itching  . Metformin And Related    Objective:  There were no vitals filed for this visit. There is no height or weight on file to calculate BMI. Constitutional Well developed. Well nourished.  Vascular Dorsalis pedis pulses palpable bilaterally. Posterior tibial pulses palpable bilaterally. Capillary refill normal to all digits.  No cyanosis or clubbing noted. Pedal hair growth normal.  Neurologic Normal speech. Oriented to person, place, and time. Epicritic sensation to light touch grossly present bilaterally.  Dermatologic   left third digit pain on palpation to the proximal nail fold.  There is erythema surrounding the nail itself.  No concern for wound.  No purulent drainage was expressed.  Scab formation noted which was debrided down with pink healthy tissue underneath.  Orthopedic: Normal joint ROM without pain or crepitus bilaterally. No visible deformities. No bony tenderness.   Radiographs: None Assessment:   1. Cellulitis of left toe   2. Pain in left foot    Plan:  Patient was evaluated and treated and all questions answered.  Left third digit erythema/cellulitis -I explained to the patient that given that she is being treated with chemotherapy and in the setting of diabetes she has a immunosuppressed immune system which can make her more susceptible to infection.  Given that the ingrown was still healing may be little bit of bacteria had creeped in which is causing the redness surrounding the nail itself.  However there is no open wounds.  The nail itself is not growing back.  I believe patient will benefit from 14 days course of doxycycline to help reduce the redness/decrease inflammation. -Doxycycline was dispensed for 14 days I will see her back in 2 weeks to evaluate improvement.  No follow-ups on file.

## 2019-12-23 ENCOUNTER — Encounter: Payer: Self-pay | Admitting: Oncology

## 2019-12-25 MED FILL — IMBRUVICA 420 MG TAB: 420 | 28 days supply | Qty: 28 | Fill #0

## 2020-01-02 ENCOUNTER — Ambulatory Visit: Payer: Medicare Other | Admitting: Podiatry

## 2020-01-07 ENCOUNTER — Ambulatory Visit (INDEPENDENT_AMBULATORY_CARE_PROVIDER_SITE_OTHER): Payer: Medicare Other | Admitting: Podiatry

## 2020-01-07 ENCOUNTER — Other Ambulatory Visit: Payer: Self-pay

## 2020-01-07 DIAGNOSIS — L600 Ingrowing nail: Secondary | ICD-10-CM

## 2020-01-07 DIAGNOSIS — L03032 Cellulitis of left toe: Secondary | ICD-10-CM | POA: Diagnosis not present

## 2020-01-07 DIAGNOSIS — M79672 Pain in left foot: Secondary | ICD-10-CM | POA: Diagnosis not present

## 2020-01-08 ENCOUNTER — Encounter: Payer: Self-pay | Admitting: Podiatry

## 2020-01-08 DIAGNOSIS — E785 Hyperlipidemia, unspecified: Secondary | ICD-10-CM | POA: Diagnosis not present

## 2020-01-08 DIAGNOSIS — Z6830 Body mass index (BMI) 30.0-30.9, adult: Secondary | ICD-10-CM | POA: Diagnosis not present

## 2020-01-08 DIAGNOSIS — R252 Cramp and spasm: Secondary | ICD-10-CM | POA: Diagnosis not present

## 2020-01-08 DIAGNOSIS — Z7984 Long term (current) use of oral hypoglycemic drugs: Secondary | ICD-10-CM | POA: Diagnosis not present

## 2020-01-08 DIAGNOSIS — G35 Multiple sclerosis: Secondary | ICD-10-CM | POA: Diagnosis not present

## 2020-01-08 DIAGNOSIS — M1990 Unspecified osteoarthritis, unspecified site: Secondary | ICD-10-CM | POA: Diagnosis not present

## 2020-01-08 DIAGNOSIS — E1169 Type 2 diabetes mellitus with other specified complication: Secondary | ICD-10-CM | POA: Diagnosis not present

## 2020-01-08 DIAGNOSIS — E669 Obesity, unspecified: Secondary | ICD-10-CM | POA: Diagnosis not present

## 2020-01-08 NOTE — Progress Notes (Signed)
Subjective:  Patient ID: Rachel Coffey, female    DOB: 1951/07/25,  MRN: [id number]  Chief Complaint  Patient presents with  . Nail Problem    pt is here a left toenail check of the left 3rd toenail, pt states that she is feeling a lot better since the last time she was here, pt states that she no longer has any pain, but is concerned about the right 4th lateral side toenail becoming an ingrown    69 y.o. female presents with the above complaint.  Patient is following up from left third digit ingrown procedure that was done by me couple of months ago.  Patient states the redness is improved since last visit.  She has completed her course of antibiotics.  She denies any other acute complaints.  She has been going undergoing chemotherapy with diabetes which is decreasing her immune system and therefore not healing as aggressively as it should.  She denies any other acute complaints.   Review of Systems: Negative except as noted in the HPI. Denies N/V/F/Ch.  Past Medical History:  Diagnosis Date  . Asthma    stress induced with allergies  . Diabetes mellitus without complication ([place])   . Genital warts   . Hyperlipidemia   . MS (multiple sclerosis) ([place])   . Neuromuscular disorder ([place])    dx. "89- MS" -Dr. Barkley Boards, [place](Neuropathy and "band around the chest"  . STD (sexually transmitted disease)    genital warts--years ago    Current Outpatient Medications:  .  albuterol (PROVENTIL HFA;VENTOLIN HFA) 108 (90 BASE) MCG/ACT inhaler, Inhale 2 puffs into the lungs every 6 (six) hours as needed for wheezing., Disp: , Rfl:  .  ALPRAZolam (XANAX) 0.5 MG tablet, Take 0.5 mg by mouth daily as needed (for pain). , Disp: , Rfl:  .  cholecalciferol (VITAMIN D) 1000 UNITS tablet, Take 2,000 Units by mouth daily. , Disp: , Rfl:  .  CINNAMON PO, Take 1,000 mg by mouth daily. , Disp: , Rfl:  .  cyclobenzaprine (FLEXERIL) 10 MG tablet, Take 10 mg by mouth 3 (three) times daily as needed for  muscle spasms., Disp: , Rfl:  .  doxycycline (VIBRA-TABS) 100 MG tablet, Take 1 tablet (100 mg total) by mouth 2 (two) times daily., Disp: 14 tablet, Rfl: 0 .  esomeprazole (NEXIUM) 20 MG capsule, Take 20 mg by mouth daily at 12 noon. , Disp: , Rfl:  .  fluticasone (FLONASE) 50 MCG/ACT nasal spray, Place 1 spray into both nostrils daily. Sometimes bid, Disp: , Rfl:  .  glipiZIDE (GLUCOTROL XL) 2.5 MG 24 hr tablet, Take 2.5 mg by mouth 2 (two) times daily. , Disp: , Rfl:  .  IMBRUVICA 420 MG TABS, TAKE 1 TABLET (420 MG) BY MOUTH DAILY. ADMINISTER WITH WATER AT APPROXIMATELY THE SAME TIME EACH DAY., Disp: 28 tablet, Rfl: 0 .  Loratadine 10 MG CAPS, Take 10 mg by mouth daily as needed., Disp: , Rfl:  .  losartan (COZAAR) 25 MG tablet, Take 25 mg by mouth daily., Disp: , Rfl:  .  meloxicam (MOBIC) 15 MG tablet, Take 15 mg by mouth daily. Prn, Disp: , Rfl:  .  Probiotic Product (ACIDOPHILUS/BIFIDUS PO), Take 1 tablet by mouth daily., Disp: , Rfl:  .  simvastatin (ZOCOR) 20 MG tablet, Take 20 mg by mouth at bedtime., Disp: , Rfl:   Social History   Tobacco Use  Smoking Status Former Smoker  . Types: Cigarettes  . Quit date: 01/31/2005  .  Years since quitting: 14.9  Smokeless Tobacco Never Used    Allergies  Allergen Reactions  . Codeine Itching  . Fenofibrate     aching joints  . Hydrocodone Itching  . Metformin And Related    Objective:  There were no vitals filed for this visit. There is no height or weight on file to calculate BMI. Constitutional Well developed. Well nourished.  Vascular Dorsalis pedis pulses palpable bilaterally. Posterior tibial pulses palpable bilaterally. Capillary refill normal to all digits.  No cyanosis or clubbing noted. Pedal hair growth normal.  Neurologic Normal speech. Oriented to person, place, and time. Epicritic sensation to light touch grossly present bilaterally.  Dermatologic  no pain on palpation to the left third digit.  Cellulitis/erythema  has resolved.  No concern for wound.  No purulent drainage was expressed.  Scab formation noted which was debrided down with pink healthy tissue underneath.  Orthopedic: Normal joint ROM without pain or crepitus bilaterally. No visible deformities. No bony tenderness.   Radiographs: None Assessment:   No diagnosis found. Plan:  Patient was evaluated and treated and all questions answered.  Left third digit erythema/cellulitis -I explained to the patient that given that she is being treated with chemotherapy and in the setting of diabetes she has a immunosuppressed immune system which can make her more susceptible to infection.  After taking the course of antibiotics her erythema/cellulitis has completely resolved.  She does not have any pain associated with it.   -I will see her back as needed if any future foot and ankle issues arises I have asked her to come back and see me.  She states understanding.  No follow-ups on file.

## 2020-01-10 DIAGNOSIS — Z23 Encounter for immunization: Secondary | ICD-10-CM | POA: Diagnosis not present

## 2020-01-16 ENCOUNTER — Other Ambulatory Visit: Payer: Self-pay | Admitting: Oncology

## 2020-01-16 DIAGNOSIS — C9110 Chronic lymphocytic leukemia of B-cell type not having achieved remission: Secondary | ICD-10-CM

## 2020-01-20 ENCOUNTER — Inpatient Hospital Stay: Payer: Medicare Other | Attending: Nurse Practitioner | Admitting: Oncology

## 2020-01-20 ENCOUNTER — Other Ambulatory Visit: Payer: Self-pay

## 2020-01-20 ENCOUNTER — Inpatient Hospital Stay: Payer: Medicare Other

## 2020-01-20 VITALS — BP 129/58 | HR 68 | Temp 98.3°F | Resp 17 | Ht 67.0 in | Wt 194.7 lb

## 2020-01-20 DIAGNOSIS — E119 Type 2 diabetes mellitus without complications: Secondary | ICD-10-CM | POA: Diagnosis not present

## 2020-01-20 DIAGNOSIS — G35 Multiple sclerosis: Secondary | ICD-10-CM | POA: Insufficient documentation

## 2020-01-20 DIAGNOSIS — M2550 Pain in unspecified joint: Secondary | ICD-10-CM | POA: Diagnosis not present

## 2020-01-20 DIAGNOSIS — Z85820 Personal history of malignant melanoma of skin: Secondary | ICD-10-CM | POA: Diagnosis not present

## 2020-01-20 DIAGNOSIS — C9110 Chronic lymphocytic leukemia of B-cell type not having achieved remission: Secondary | ICD-10-CM

## 2020-01-20 DIAGNOSIS — J449 Chronic obstructive pulmonary disease, unspecified: Secondary | ICD-10-CM | POA: Diagnosis not present

## 2020-01-20 LAB — CBC WITH DIFFERENTIAL (CANCER CENTER ONLY)
Abs Immature Granulocytes: 0.03 10*3/uL (ref 0.00–0.07)
Basophils Absolute: 0.1 10*3/uL (ref 0.0–0.1)
Basophils Relative: 1 %
Eosinophils Absolute: 0.1 10*3/uL (ref 0.0–0.5)
Eosinophils Relative: 2 %
HCT: 43 % (ref 36.0–46.0)
Hemoglobin: 13.7 g/dL (ref 12.0–15.0)
Immature Granulocytes: 0 %
Lymphocytes Relative: 23 %
Lymphs Abs: 2 10*3/uL (ref 0.7–4.0)
MCH: 27.8 pg (ref 26.0–34.0)
MCHC: 31.9 g/dL (ref 30.0–36.0)
MCV: 87.4 fL (ref 80.0–100.0)
Monocytes Absolute: 0.5 10*3/uL (ref 0.1–1.0)
Monocytes Relative: 6 %
Neutro Abs: 5.9 10*3/uL (ref 1.7–7.7)
Neutrophils Relative %: 68 %
Platelet Count: 159 10*3/uL (ref 150–400)
RBC: 4.92 MIL/uL (ref 3.87–5.11)
RDW: 13.1 % (ref 11.5–15.5)
WBC Count: 8.6 10*3/uL (ref 4.0–10.5)
nRBC: 0 % (ref 0.0–0.2)

## 2020-01-20 LAB — CMP (CANCER CENTER ONLY)
ALT: 14 U/L (ref 0–44)
AST: 15 U/L (ref 15–41)
Albumin: 3.8 g/dL (ref 3.5–5.0)
Alkaline Phosphatase: 68 U/L (ref 38–126)
Anion gap: 5 (ref 5–15)
BUN: 16 mg/dL (ref 8–23)
CO2: 31 mmol/L (ref 22–32)
Calcium: 9.5 mg/dL (ref 8.9–10.3)
Chloride: 106 mmol/L (ref 98–111)
Creatinine: 0.78 mg/dL (ref 0.44–1.00)
GFR, Est AFR Am: >60 mL/min (ref >60)
GFR, Estimated: >60 mL/min (ref >60)
Glucose, Bld: 125 mg/dL — ABNORMAL HIGH (ref 70–99)
Potassium: 4.8 mmol/L (ref 3.5–5.1)
Sodium: 142 mmol/L (ref 135–145)
Total Bilirubin: 0.4 mg/dL (ref 0.3–1.2)
Total Protein: 6.3 g/dL — ABNORMAL LOW (ref 6.5–8.1)

## 2020-01-20 MED FILL — IMBRUVICA 420 MG TAB: 420 | 28 days supply | Qty: 28 | Fill #0

## 2020-01-20 NOTE — Progress Notes (Signed)
  [place] [facility] OFFICE PROGRESS NOTE   Diagnosis: CLL  INTERVAL HISTORY:   Rachel Coffey continues ibrutinib.  No rash or diarrhea.  Mild arthralgias.  No significant rash.  No other complaint. She has received both doses of the COVID-19 vaccine. Objective:  Vital signs in last 24 hours:  Blood pressure (!) 129/58, pulse 68, temperature 98.3 F (36.8 C), temperature source Temporal, resp. rate 17, height '5\' 7"'$  (1.702 m), weight 194 lb 11.2 oz (88.3 kg), SpO2 100 %.     Lymphatics: No cervical, supraclavicular, axillary, or inguinal nodes Resp: Lungs clear bilaterally Cardio: Regular rate and rhythm GI: No hepatosplenomegaly, no mass, nontender Vascular: No leg edema   Lab Results:  Lab Results  Component Value Date   WBC 8.6 01/20/2020   HGB 13.7 01/20/2020   HCT 43.0 01/20/2020   MCV 87.4 01/20/2020   PLT 159 01/20/2020   NEUTROABS 5.9 01/20/2020    CMP  Lab Results  Component Value Date   NA 142 01/20/2020   K 4.8 01/20/2020   CL 106 01/20/2020   CO2 31 01/20/2020   GLUCOSE 125 (H) 01/20/2020   BUN 16 01/20/2020   CREATININE 0.78 01/20/2020   CALCIUM 9.5 01/20/2020   PROT 6.3 (L) 01/20/2020   ALBUMIN 3.8 01/20/2020   AST 15 01/20/2020   ALT 14 01/20/2020   ALKPHOS 68 01/20/2020   BILITOT 0.4 01/20/2020   GFRNONAA >60 01/20/2020   GFRAA >60 01/20/2020    Medications: I have reviewed the patient's current medications.   Assessment/Plan: 1. 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 Monoclonal Kappa restricted B-cell population identified onflow cytometry ? Staging PET scan 06/13/2016 confirmed hypermetabolic abdominal/pelvic lymphadenopathy ? FISHpanel positive for 11q-,-12,13q-, and 17p-(10% of cells) ? Peripheral blood flow cytometry 09/22/2016 at [facility]-CD5/CD23 positive For restricted B-cell population, 2% of lymphocytes ? Peripheral blood IGH mutation analysis at [facility]  09/22/2016-unmutated ? CT 06/06/2017-generally stable diffuse lymphadenopathy, node in the pelvis is smaller, some the lymph nodes are larger ? CTs 07/02/2018- slight decrease in size of retrocrural, retroperitoneal, and mesenteric adenopathy, new 6 mm left upper lobe nodule ? CT chest 01/01/2019- increase in size of left upper lobe lung nodule-inflammatory appearance, other stable lung nodules, right retrocrural and upper abdominal adenopathy has progressed ? CTs 07/04/2019- progressive lower thoracic and abdominal adenopathy with dominant mass-effect on the stomach and pancreas, new heterogeneity in the spleen- possible splenic lesion, stable lung nodules ? Ibrutinib starting 08/14/2019 ? CT abdomen/pelvis 11/19/2019-decreased size of dominant abdominal mass and decreased abdominal lymphadenopathy  2. Stage IB (T1b,N0) melanoma of the right forearm, status post a wide excision and sentinel lymph node biopsy at [facility] March 2017   3. Multiple sclerosis  4. Diabetes  5.   COPD   Disposition: Rachel Coffey appears stable.  She is tolerating the ibrutinib well.  No clinical evidence for progression of the CLL.  She will continue ibrutinib.  She will return for an office and lab visit in 3 months.  [name], MD  01/20/2020  3:41 PM

## 2020-02-21 ENCOUNTER — Other Ambulatory Visit: Payer: Self-pay | Admitting: Oncology

## 2020-02-21 DIAGNOSIS — C9110 Chronic lymphocytic leukemia of B-cell type not having achieved remission: Secondary | ICD-10-CM

## 2020-02-24 MED FILL — IMBRUVICA 420 MG TAB: 420 | 28 days supply | Qty: 28 | Fill #0

## 2020-02-25 DIAGNOSIS — H5203 Hypermetropia, bilateral: Secondary | ICD-10-CM | POA: Diagnosis not present

## 2020-02-25 DIAGNOSIS — H524 Presbyopia: Secondary | ICD-10-CM | POA: Diagnosis not present

## 2020-02-25 DIAGNOSIS — E119 Type 2 diabetes mellitus without complications: Secondary | ICD-10-CM | POA: Diagnosis not present

## 2020-02-25 DIAGNOSIS — G35 Multiple sclerosis: Secondary | ICD-10-CM | POA: Diagnosis not present

## 2020-02-25 DIAGNOSIS — H2513 Age-related nuclear cataract, bilateral: Secondary | ICD-10-CM | POA: Diagnosis not present

## 2020-02-25 DIAGNOSIS — H52223 Regular astigmatism, bilateral: Secondary | ICD-10-CM | POA: Diagnosis not present

## 2020-03-12 DIAGNOSIS — Z1231 Encounter for screening mammogram for malignant neoplasm of breast: Secondary | ICD-10-CM | POA: Diagnosis not present

## 2020-03-13 DIAGNOSIS — L853 Xerosis cutis: Secondary | ICD-10-CM | POA: Diagnosis not present

## 2020-03-13 DIAGNOSIS — L910 Hypertrophic scar: Secondary | ICD-10-CM | POA: Diagnosis not present

## 2020-03-13 DIAGNOSIS — L820 Inflamed seborrheic keratosis: Secondary | ICD-10-CM | POA: Diagnosis not present

## 2020-03-13 DIAGNOSIS — L578 Other skin changes due to chronic exposure to nonionizing radiation: Secondary | ICD-10-CM | POA: Diagnosis not present

## 2020-03-13 DIAGNOSIS — W57XXXA Bitten or stung by nonvenomous insect and other nonvenomous arthropods, initial encounter: Secondary | ICD-10-CM | POA: Diagnosis not present

## 2020-03-13 DIAGNOSIS — Z808 Family history of malignant neoplasm of other organs or systems: Secondary | ICD-10-CM | POA: Diagnosis not present

## 2020-03-13 DIAGNOSIS — Z85820 Personal history of malignant melanoma of skin: Secondary | ICD-10-CM | POA: Diagnosis not present

## 2020-03-13 DIAGNOSIS — L603 Nail dystrophy: Secondary | ICD-10-CM | POA: Diagnosis not present

## 2020-03-17 DIAGNOSIS — G35 Multiple sclerosis: Secondary | ICD-10-CM | POA: Diagnosis not present

## 2020-03-17 DIAGNOSIS — C8300 Small cell B-cell lymphoma, unspecified site: Secondary | ICD-10-CM | POA: Diagnosis not present

## 2020-03-19 ENCOUNTER — Other Ambulatory Visit: Payer: Self-pay | Admitting: Oncology

## 2020-03-19 DIAGNOSIS — C9110 Chronic lymphocytic leukemia of B-cell type not having achieved remission: Secondary | ICD-10-CM

## 2020-03-25 ENCOUNTER — Ambulatory Visit
Admission: EM | Admit: 2020-03-25 | Discharge: 2020-03-25 | Disposition: A | Discharge status: Home / Self Care | Payer: Medicare Other

## 2020-03-25 ENCOUNTER — Other Ambulatory Visit: Payer: Self-pay

## 2020-03-25 DIAGNOSIS — M25551 Pain in right hip: Secondary | ICD-10-CM

## 2020-03-25 DIAGNOSIS — W57XXXA Bitten or stung by nonvenomous insect and other nonvenomous arthropods, initial encounter: Secondary | ICD-10-CM | POA: Diagnosis not present

## 2020-03-25 DIAGNOSIS — W458XXA Other foreign body or object entering through skin, initial encounter: Secondary | ICD-10-CM

## 2020-03-25 NOTE — ED Triage Notes (Signed)
Pt c/o tic inbedded in her right hip area. Pt's husband tried to pull tic out, but just pulled half of the tic off and the other half is still inbedded in skin. The skin surrounding the tic is erythematous.

## 2020-03-25 NOTE — ED Provider Notes (Signed)
[facility]   [id number] 03/25/20 Arrival Time: [id number]   SUBJECTIVE: History from: patient. Rachel Coffey is a 69 y.o. female who reports finding and removing a partially-engorged tick from her R hip today. Feeling well. Afebrile. No rashes. Does report mild redness at site of bite. No headaches, n/v, visual changes, extremity edema reported. Ambulatory without difficulty. No OTC treatment.  ROS: As per HPI.   OBJECTIVE:  Vitals:   03/25/20 1724 03/25/20 1727  BP: 128/72   Pulse: 72   Resp: 18   Temp: 98 F (36.7 C)   TempSrc: Oral   SpO2: 96%   Weight:  190 lb (86.2 kg)  Height:  5' 7.5" (1.715 m)    General appearance: alert; no distress Eyes: PERRLA; EOMI; conjunctiva normal HENT: normocephalic; atraumatic Lungs: clear to auscultation bilaterally; unlabored Heart: regular rate and rhythm Abdomen: soft, non-tender Back: no CVA tenderness Extremities: no edema; symmetrical with no gross deformities Skin: warm and dry; very slight 0.5 cm induration of erythema; normal skin temperature; visible foreign bodies or parts of tick appreciated and removed with alligator clamp; no sign of infection Neurologic: normal gait; normal symmetric reflexes Psychological: alert and cooperative; normal mood and affect  Labs: Results for orders placed or performed in visit on 01/20/20  CBC with Differential (Cancer Center Only)  Result Value Ref Range   WBC Count 8.6 4.0 - 10.5 K/uL   RBC 4.92 3.87 - 5.11 MIL/uL   Hemoglobin 13.7 12.0 - 15.0 g/dL   HCT 43.0 36.0 - 46.0 %   MCV 87.4 80.0 - 100.0 fL   MCH 27.8 26.0 - 34.0 pg   MCHC 31.9 30.0 - 36.0 g/dL   RDW 13.1 11.5 - 15.5 %   Platelet Count 159 150 - 400 K/uL   nRBC 0.0 0.0 - 0.2 %   Neutrophils Relative % 68 %   Neutro Abs 5.9 1.7 - 7.7 K/uL   Lymphocytes Relative 23 %   Lymphs Abs 2.0 0.7 - 4.0 K/uL   Monocytes Relative 6 %   Monocytes Absolute 0.5 0.1 - 1.0 K/uL   Eosinophils Relative 2 %   Eosinophils  Absolute 0.1 0.0 - 0.5 K/uL   Basophils Relative 1 %   Basophils Absolute 0.1 0.0 - 0.1 K/uL   Immature Granulocytes 0 %   Abs Immature Granulocytes 0.03 0.00 - 0.07 K/uL  CMP (Cancer Center only)  Result Value Ref Range   Sodium 142 135 - 145 mmol/L   Potassium 4.8 3.5 - 5.1 mmol/L   Chloride 106 98 - 111 mmol/L   CO2 31 22 - 32 mmol/L   Glucose, Bld 125 (H) 70 - 99 mg/dL   BUN 16 8 - 23 mg/dL   Creatinine 0.78 0.44 - 1.00 mg/dL   Calcium 9.5 8.9 - 10.3 mg/dL   Total Protein 6.3 (L) 6.5 - 8.1 g/dL   Albumin 3.8 3.5 - 5.0 g/dL   AST 15 15 - 41 U/L   ALT 14 0 - 44 U/L   Alkaline Phosphatase 68 38 - 126 U/L   Total Bilirubin 0.4 0.3 - 1.2 mg/dL   GFR, Est Non Af Am >60 >60 mL/min   GFR, Est AFR Am >60 >60 mL/min   Anion gap 5 5 - 15   Labs Reviewed - No data to display  Imaging: No results found.  Allergies  Allergen Reactions  . Codeine Itching  . Fenofibrate     aching joints  . Hydrocodone Itching  .  Metformin And Related     Past Medical History:  Diagnosis Date  . Asthma    stress induced with allergies  . Diabetes mellitus without complication ([place])   . Genital warts   . Hyperlipidemia   . MS (multiple sclerosis) ([place])   . Neuromuscular disorder ([place])    dx. "35- MS" -Dr. Barkley Boards, [place](Neuropathy and "band around the chest"  . STD (sexually transmitted disease)    genital warts--years ago   Social History   Socioeconomic History  . Marital status: Married    Spouse name: Not on file  . Number of children: Not on file  . Years of education: Not on file  . Highest education level: Not on file  Occupational History  . Not on file  Tobacco Use  . Smoking status: Former Smoker    Types: Cigarettes    Quit date: 01/31/2005    Years since quitting: 15.1  . Smokeless tobacco: Never Used  Substance and Sexual Activity  . Alcohol use: No  . Drug use: No  . Sexual activity: Never    Partners: Male    Comment: [facility]--still has 1 ovary  Other Topics  Concern  . Not on file  Social History Narrative  . Not on file   Social Determinants of Health   Financial Resource Strain:   . Difficulty of Paying Living Expenses:   Food Insecurity:   . Worried About Charity fundraiser in the Last Year:   . Arboriculturist in the Last Year:   Transportation Needs:   . Film/video editor (Medical):   Marland Kitchen Lack of Transportation (Non-Medical):   Physical Activity:   . Days of Exercise per Week:   . Minutes of Exercise per Session:   Stress:   . Feeling of Stress :   Social Connections:   . Frequency of Communication with Friends and Family:   . Frequency of Social Gatherings with Friends and Family:   . Attends Religious Services:   . Active Member of Clubs or Organizations:   . Attends Archivist Meetings:   Marland Kitchen Marital Status:   Intimate Partner Violence:   . Fear of Current or Ex-Partner:   . Emotionally Abused:   Marland Kitchen Physically Abused:   . Sexually Abused:    Family History  Problem Relation Age of Onset  . COPD Mother   . Heart disease Mother   . Diabetes Father   . Heart disease Sister    Past Surgical History:  Procedure Laterality Date  . ABDOMINAL HYSTERECTOMY     with 1 ovary removed-[facility]  . CHOLECYSTECTOMY N/A 02/25/2013   Procedure: LAPAROSCOPIC CHOLECYSTECTOMY WITH INTRAOPERATIVE CHOLANGIOGRAM;  Surgeon: Earnstine Regal, MD;  Location: [facility];  Service: General;  Laterality: N/A;  . COLONOSCOPY  02-19-13   1 polyp removal in past  . TONSILLECTOMY     ASSESSMENT & PLAN:  No diagnosis found.  No sign of infection. Reports redness at site of bite but this is stable and not worsening; discussed this is likely inflammatory secondary to tick bite. Precautions to monitor for flu-like symptoms or fever along with any new rashes. Should these develop she will seek follow up. No indication for prophylactic doxy  No orders of the defined types were placed in this encounter.  Reviewed expectations re: course of current  medical issues. Questions answered. Outlined signs and symptoms indicating need for more acute intervention. Patient verbalized understanding. After Visit Summary given.     Zigmund Daniel,  [place]letta [place], NP 03/25/20 1746

## 2020-03-25 NOTE — Discharge Instructions (Addendum)
We have removed the tick from your Right hip.  If you notice any bullseye patterned rash, fever, chills, body aches, follow up with this office or with primary care.

## 2020-04-03 DIAGNOSIS — J45909 Unspecified asthma, uncomplicated: Secondary | ICD-10-CM | POA: Diagnosis not present

## 2020-04-03 DIAGNOSIS — E1169 Type 2 diabetes mellitus with other specified complication: Secondary | ICD-10-CM | POA: Diagnosis not present

## 2020-04-03 DIAGNOSIS — M1990 Unspecified osteoarthritis, unspecified site: Secondary | ICD-10-CM | POA: Diagnosis not present

## 2020-04-03 DIAGNOSIS — E785 Hyperlipidemia, unspecified: Secondary | ICD-10-CM | POA: Diagnosis not present

## 2020-04-15 ENCOUNTER — Other Ambulatory Visit: Payer: Self-pay | Admitting: Oncology

## 2020-04-15 DIAGNOSIS — C9110 Chronic lymphocytic leukemia of B-cell type not having achieved remission: Secondary | ICD-10-CM

## 2020-04-21 ENCOUNTER — Inpatient Hospital Stay: Payer: Medicare Other

## 2020-04-21 ENCOUNTER — Telehealth: Payer: Self-pay | Admitting: Oncology

## 2020-04-21 ENCOUNTER — Other Ambulatory Visit: Payer: Self-pay

## 2020-04-21 ENCOUNTER — Inpatient Hospital Stay: Payer: Medicare Other | Attending: Nurse Practitioner | Admitting: Oncology

## 2020-04-21 DIAGNOSIS — C9110 Chronic lymphocytic leukemia of B-cell type not having achieved remission: Secondary | ICD-10-CM

## 2020-04-21 DIAGNOSIS — J449 Chronic obstructive pulmonary disease, unspecified: Secondary | ICD-10-CM | POA: Insufficient documentation

## 2020-04-21 DIAGNOSIS — G35 Multiple sclerosis: Secondary | ICD-10-CM | POA: Insufficient documentation

## 2020-04-21 DIAGNOSIS — E119 Type 2 diabetes mellitus without complications: Secondary | ICD-10-CM | POA: Insufficient documentation

## 2020-04-21 DIAGNOSIS — R51 Headache with orthostatic component, not elsewhere classified: Secondary | ICD-10-CM | POA: Diagnosis not present

## 2020-04-21 DIAGNOSIS — R109 Unspecified abdominal pain: Secondary | ICD-10-CM | POA: Diagnosis not present

## 2020-04-21 LAB — CMP (CANCER CENTER ONLY)
ALT: 14 U/L (ref 0–44)
AST: 15 U/L (ref 15–41)
Albumin: 3.7 g/dL (ref 3.5–5.0)
Alkaline Phosphatase: 74 U/L (ref 38–126)
Anion gap: 8 (ref 5–15)
BUN: 19 mg/dL (ref 8–23)
CO2: 28 mmol/L (ref 22–32)
Calcium: 9.5 mg/dL (ref 8.9–10.3)
Chloride: 105 mmol/L (ref 98–111)
Creatinine: 0.83 mg/dL (ref 0.44–1.00)
GFR, Est AFR Am: >60 mL/min (ref >60)
GFR, Estimated: >60 mL/min (ref >60)
Glucose, Bld: 177 mg/dL — ABNORMAL HIGH (ref 70–99)
Potassium: 4 mmol/L (ref 3.5–5.1)
Sodium: 141 mmol/L (ref 135–145)
Total Bilirubin: 0.5 mg/dL (ref 0.3–1.2)
Total Protein: 6.3 g/dL — ABNORMAL LOW (ref 6.5–8.1)

## 2020-04-21 LAB — CBC WITH DIFFERENTIAL (CANCER CENTER ONLY)
Abs Immature Granulocytes: 0.03 10*3/uL (ref 0.00–0.07)
Basophils Absolute: 0.1 10*3/uL (ref 0.0–0.1)
Basophils Relative: 1 %
Eosinophils Absolute: 0.1 10*3/uL (ref 0.0–0.5)
Eosinophils Relative: 1 %
HCT: 43.7 % (ref 36.0–46.0)
Hemoglobin: 13.9 g/dL (ref 12.0–15.0)
Immature Granulocytes: 0 %
Lymphocytes Relative: 24 %
Lymphs Abs: 2.3 10*3/uL (ref 0.7–4.0)
MCH: 28.3 pg (ref 26.0–34.0)
MCHC: 31.8 g/dL (ref 30.0–36.0)
MCV: 88.8 fL (ref 80.0–100.0)
Monocytes Absolute: 0.7 10*3/uL (ref 0.1–1.0)
Monocytes Relative: 7 %
Neutro Abs: 6.4 10*3/uL (ref 1.7–7.7)
Neutrophils Relative %: 67 %
Platelet Count: 167 10*3/uL (ref 150–400)
RBC: 4.92 MIL/uL (ref 3.87–5.11)
RDW: 12.9 % (ref 11.5–15.5)
WBC Count: 9.5 10*3/uL (ref 4.0–10.5)
nRBC: 0 % (ref 0.0–0.2)

## 2020-04-21 NOTE — Progress Notes (Signed)
[place] OFFICE PROGRESS NOTE   Diagnosis: CLL  INTERVAL HISTORY:   Ms. Hemm returns as scheduled.  She continues ibrutinib.  No fever, consistent night sweats, or diarrhea.  Good appetite.  She reports 3-4 episodes of abdominal pain and nausea over the past few months.  She believes this is related to reflux as her symptoms improved when she took Rolaids.  She vomited twice. She has developed a nodular lesion at the left upper arm.  She is scheduled to see dermatology later this week she has erythema at the lower arms after being in the sun for several hours. She reports her neurologist has scheduled a brain MRI and nerve conduction studies for reevaluation of multiple sclerosis Objective: Headache Vital signs in last 24 hours:  There were no vitals taken for this visit.    Lymphatics: No cervical, supraclavicular, axillary, or inguinal nodes Resp: Lungs clear bilaterally Cardio: Regular rate and rhythm GI: No mass, nontender, no hepatosplenomegaly Vascular: No leg edema  Skin: Mild erythema in a sun distribution at the lower arms, 1 cm slightly raised tan scaling lesion at the left upper arm    Lab Results:  Lab Results  Component Value Date   WBC 9.5 04/21/2020   HGB 13.9 04/21/2020   HCT 43.7 04/21/2020   MCV 88.8 04/21/2020   PLT 167 04/21/2020   NEUTROABS 6.4 04/21/2020    CMP  Lab Results  Component Value Date   NA 141 04/21/2020   K 4.0 04/21/2020   CL 105 04/21/2020   CO2 28 04/21/2020   GLUCOSE 177 (H) 04/21/2020   BUN 19 04/21/2020   CREATININE 0.83 04/21/2020   CALCIUM 9.5 04/21/2020   PROT 6.3 (L) 04/21/2020   ALBUMIN 3.7 04/21/2020   AST 15 04/21/2020   ALT 14 04/21/2020   ALKPHOS 74 04/21/2020   BILITOT 0.5 04/21/2020   GFRNONAA >60 04/21/2020   GFRAA >60 04/21/2020    Medications: I have reviewed the patient's current medications.   Assessment/Plan: 1. Small lymphatic lymphoma/CLL diagnosed on core biopsy of  mesenteric lymphadenopathy 06/20/2016 ? Monoclonal Kappa restricted B-cell population identified onflow cytometry ? Staging PET scan 06/13/2016 confirmed hypermetabolic abdominal/pelvic lymphadenopathy ? FISHpanel positive for 11q-,-12,13q-, and 17p-(10% of cells) ? Peripheral blood flow cytometry 09/22/2016 at [facility]-CD5/CD23 positive For restricted B-cell population, 2% of lymphocytes ? Peripheral blood IGH mutation analysis at [facility] 09/22/2016-unmutated ? CT 06/06/2017-generally stable diffuse lymphadenopathy, node in the pelvis is smaller, some the lymph nodes are larger ? CTs 07/02/2018- slight decrease in size of retrocrural, retroperitoneal, and mesenteric adenopathy, new 6 mm left upper lobe nodule ? CT chest 01/01/2019- increase in size of left upper lobe lung nodule-inflammatory appearance, other stable lung nodules, right retrocrural and upper abdominal adenopathy has progressed ? CTs 07/04/2019- progressive lower thoracic and abdominal adenopathy with dominant mass-effect on the stomach and pancreas, new heterogeneity in the spleen- possible splenic lesion, stable lung nodules ? Ibrutinib starting 08/14/2019 ? CT abdomen/pelvis 11/19/2019-decreased size of dominant abdominal mass and decreased abdominal lymphadenopathy  2. Stage IB (T1b,N0) melanoma of the right forearm, status post a wide excision and sentinel lymph node biopsy at [facility] March 2017   3. Multiple sclerosis  4. Diabetes  5.   COPD     Disposition: Rachel Coffey appears stable.  She will continue ibrutinib.  She will call for consistent abdominal pain or nausea.  She will be scheduled for restaging CTs and an office visit in early August.  She will see dermatology  for removal of the left arm lesion later this week.  She will let us know if a malignancy is confirmed.  [name], MD  04/21/2020  4:23 PM

## 2020-04-21 NOTE — Telephone Encounter (Signed)
Scheduled per 6/8 los. Pt aware of appts. No avs or calendar needed to be printed.

## 2020-04-24 DIAGNOSIS — L820 Inflamed seborrheic keratosis: Secondary | ICD-10-CM | POA: Diagnosis not present

## 2020-04-24 DIAGNOSIS — L570 Actinic keratosis: Secondary | ICD-10-CM | POA: Diagnosis not present

## 2020-04-24 DIAGNOSIS — L308 Other specified dermatitis: Secondary | ICD-10-CM | POA: Diagnosis not present

## 2020-04-24 DIAGNOSIS — D485 Neoplasm of uncertain behavior of skin: Secondary | ICD-10-CM | POA: Diagnosis not present

## 2020-05-01 ENCOUNTER — Encounter: Payer: Self-pay | Admitting: Oncology

## 2020-05-12 ENCOUNTER — Other Ambulatory Visit: Payer: Self-pay | Admitting: Oncology

## 2020-05-12 DIAGNOSIS — C9110 Chronic lymphocytic leukemia of B-cell type not having achieved remission: Secondary | ICD-10-CM

## 2020-05-18 MED FILL — IMBRUVICA 420 MG TAB: 420 | 28 days supply | Qty: 28 | Fill #0

## 2020-05-20 ENCOUNTER — Other Ambulatory Visit: Payer: Self-pay

## 2020-05-20 ENCOUNTER — Encounter: Payer: Medicare Other | Attending: Family Medicine | Admitting: Skilled Nursing Facility1

## 2020-05-20 DIAGNOSIS — E119 Type 2 diabetes mellitus without complications: Secondary | ICD-10-CM | POA: Diagnosis not present

## 2020-05-20 NOTE — Progress Notes (Signed)
  Assessment:  Primary concerns today: weight management .   Pt sates she will start taking gabapentin tomorrow.  Pt states her GERD has gotten mush worse due to the lymphoma.  A1C 6.9 Pt states dark chocolate satisfies her sweet craving; pt states she finds it comforting to have that available.   Pt states she eats past 7pm to put something in her stomach to stop acid.  Pt states she eats way too fast.  Pt states she checks her blood sugars daily 114 typically in the morning and int he afternoon 96.   MEDICATIONS: see list   DIETARY INTAKE:  Usual eating pattern includes 3 meals and 3 snacks per day.  Everyday foods include none stated.  Avoided foods include none stated  24-hr recall:  B ( AM): 2 ounces sausages 2 eggs half avacado or oatmeal  Snk ( AM): wheat thins L ( PM): 1 banana + 6 tart cherries + half ruben sandwich  Snk ( PM): fruit D ( PM): chefs salad or half ruben sandwich + green beans Snk ( PM):  Beverages: 64-86 ounces-water or decaff tea  Usual physical activity: some walking     Intervention:  Nutrition counseling. Dietitian educated pt on weight gain causes and educated on a healthy diet within the context of DM and cancer.   Goals: Learn your bodies cues to eat until satisfaction not physical feeling of fullness Try alkaline water pH8.8 Keep a symptom and food journal to identify what affects your GI to have a better quality of life  Teaching Method Utilized:  Visual Auditory Hands on  Handouts given during visit include:  Detailed MyPlate  Barriers to learning/adherence to lifestyle change: non hunger eating   Demonstrated degree of understanding via:  Teach Back   Monitoring/Evaluation:  Dietary intake, exercise, and body weight prn.

## 2020-06-02 DIAGNOSIS — R262 Difficulty in walking, not elsewhere classified: Secondary | ICD-10-CM | POA: Diagnosis not present

## 2020-06-02 DIAGNOSIS — M79671 Pain in right foot: Secondary | ICD-10-CM | POA: Diagnosis not present

## 2020-06-02 DIAGNOSIS — G629 Polyneuropathy, unspecified: Secondary | ICD-10-CM | POA: Diagnosis not present

## 2020-06-02 DIAGNOSIS — M79672 Pain in left foot: Secondary | ICD-10-CM | POA: Diagnosis not present

## 2020-06-08 DIAGNOSIS — G629 Polyneuropathy, unspecified: Secondary | ICD-10-CM | POA: Diagnosis not present

## 2020-06-08 DIAGNOSIS — M79671 Pain in right foot: Secondary | ICD-10-CM | POA: Diagnosis not present

## 2020-06-08 DIAGNOSIS — R262 Difficulty in walking, not elsewhere classified: Secondary | ICD-10-CM | POA: Diagnosis not present

## 2020-06-08 DIAGNOSIS — M79672 Pain in left foot: Secondary | ICD-10-CM | POA: Diagnosis not present

## 2020-06-09 DIAGNOSIS — M79671 Pain in right foot: Secondary | ICD-10-CM | POA: Diagnosis not present

## 2020-06-09 DIAGNOSIS — R262 Difficulty in walking, not elsewhere classified: Secondary | ICD-10-CM | POA: Diagnosis not present

## 2020-06-09 DIAGNOSIS — M79672 Pain in left foot: Secondary | ICD-10-CM | POA: Diagnosis not present

## 2020-06-09 DIAGNOSIS — G629 Polyneuropathy, unspecified: Secondary | ICD-10-CM | POA: Diagnosis not present

## 2020-06-10 ENCOUNTER — Other Ambulatory Visit: Payer: Self-pay | Admitting: Oncology

## 2020-06-10 DIAGNOSIS — C9110 Chronic lymphocytic leukemia of B-cell type not having achieved remission: Secondary | ICD-10-CM

## 2020-06-15 ENCOUNTER — Telehealth: Payer: Self-pay

## 2020-06-15 DIAGNOSIS — M79672 Pain in left foot: Secondary | ICD-10-CM | POA: Diagnosis not present

## 2020-06-15 DIAGNOSIS — M79671 Pain in right foot: Secondary | ICD-10-CM | POA: Diagnosis not present

## 2020-06-15 DIAGNOSIS — G629 Polyneuropathy, unspecified: Secondary | ICD-10-CM | POA: Diagnosis not present

## 2020-06-15 DIAGNOSIS — R262 Difficulty in walking, not elsewhere classified: Secondary | ICD-10-CM | POA: Diagnosis not present

## 2020-06-15 NOTE — Telephone Encounter (Signed)
Oral Oncology Patient Advocate Encounter   Was successful in securing patient an $80 grant from Patient [facility]) to provide copayment coverage for Imbruvica.  This will keep the out of pocket expense at $0.     I have spoken with the patient.    The billing information is as follows and has been shared with [place].   Member ID: [id number] Group ID: [id number] RxBin: [id number] Dates of Eligibility: 06/15/20 through 07/2320  Fund:  CLL

## 2020-06-16 DIAGNOSIS — R262 Difficulty in walking, not elsewhere classified: Secondary | ICD-10-CM | POA: Diagnosis not present

## 2020-06-16 DIAGNOSIS — G629 Polyneuropathy, unspecified: Secondary | ICD-10-CM | POA: Diagnosis not present

## 2020-06-16 DIAGNOSIS — M79672 Pain in left foot: Secondary | ICD-10-CM | POA: Diagnosis not present

## 2020-06-16 DIAGNOSIS — M79671 Pain in right foot: Secondary | ICD-10-CM | POA: Diagnosis not present

## 2020-06-17 MED FILL — IMBRUVICA 420 MG TAB: 420 | 28 days supply | Qty: 28 | Fill #0

## 2020-06-22 ENCOUNTER — Other Ambulatory Visit: Payer: Self-pay

## 2020-06-22 ENCOUNTER — Encounter (HOSPITAL_COMMUNITY): Payer: Self-pay

## 2020-06-22 ENCOUNTER — Ambulatory Visit (HOSPITAL_COMMUNITY)
Admission: RE | Admit: 2020-06-22 | Discharge: 2020-06-22 | Disposition: A | Discharge status: Home / Self Care | Payer: Medicare Other | Source: Ambulatory Visit | Attending: Oncology | Admitting: Oncology

## 2020-06-22 ENCOUNTER — Inpatient Hospital Stay: Payer: Medicare Other | Attending: Nurse Practitioner

## 2020-06-22 DIAGNOSIS — C9110 Chronic lymphocytic leukemia of B-cell type not having achieved remission: Secondary | ICD-10-CM

## 2020-06-22 DIAGNOSIS — I2510 Atherosclerotic heart disease of native coronary artery without angina pectoris: Secondary | ICD-10-CM | POA: Insufficient documentation

## 2020-06-22 DIAGNOSIS — J449 Chronic obstructive pulmonary disease, unspecified: Secondary | ICD-10-CM | POA: Insufficient documentation

## 2020-06-22 DIAGNOSIS — J984 Other disorders of lung: Secondary | ICD-10-CM | POA: Diagnosis not present

## 2020-06-22 DIAGNOSIS — E119 Type 2 diabetes mellitus without complications: Secondary | ICD-10-CM | POA: Insufficient documentation

## 2020-06-22 DIAGNOSIS — K5730 Diverticulosis of large intestine without perforation or abscess without bleeding: Secondary | ICD-10-CM | POA: Diagnosis not present

## 2020-06-22 DIAGNOSIS — G35 Multiple sclerosis: Secondary | ICD-10-CM | POA: Diagnosis not present

## 2020-06-22 LAB — CMP (CANCER CENTER ONLY)
ALT: 13 U/L (ref 0–44)
AST: 15 U/L (ref 15–41)
Albumin: 3.9 g/dL (ref 3.5–5.0)
Alkaline Phosphatase: 67 U/L (ref 38–126)
Anion gap: 8 (ref 5–15)
BUN: 15 mg/dL (ref 8–23)
CO2: 29 mmol/L (ref 22–32)
Calcium: 10.3 mg/dL (ref 8.9–10.3)
Chloride: 102 mmol/L (ref 98–111)
Creatinine: 0.8 mg/dL (ref 0.44–1.00)
GFR, Est AFR Am: >60 mL/min (ref >60)
GFR, Estimated: >60 mL/min (ref >60)
Glucose, Bld: 107 mg/dL — ABNORMAL HIGH (ref 70–99)
Potassium: 4.1 mmol/L (ref 3.5–5.1)
Sodium: 139 mmol/L (ref 135–145)
Total Bilirubin: 0.7 mg/dL (ref 0.3–1.2)
Total Protein: 6.6 g/dL (ref 6.5–8.1)

## 2020-06-22 LAB — CBC WITH DIFFERENTIAL (CANCER CENTER ONLY)
Abs Immature Granulocytes: 0.02 10*3/uL (ref 0.00–0.07)
Basophils Absolute: 0.1 10*3/uL (ref 0.0–0.1)
Basophils Relative: 1 %
Eosinophils Absolute: 0.1 10*3/uL (ref 0.0–0.5)
Eosinophils Relative: 2 %
HCT: 44.7 % (ref 36.0–46.0)
Hemoglobin: 14.1 g/dL (ref 12.0–15.0)
Immature Granulocytes: 0 %
Lymphocytes Relative: 25 %
Lymphs Abs: 1.7 10*3/uL (ref 0.7–4.0)
MCH: 28 pg (ref 26.0–34.0)
MCHC: 31.5 g/dL (ref 30.0–36.0)
MCV: 88.7 fL (ref 80.0–100.0)
Monocytes Absolute: 0.6 10*3/uL (ref 0.1–1.0)
Monocytes Relative: 9 %
Neutro Abs: 4.3 10*3/uL (ref 1.7–7.7)
Neutrophils Relative %: 63 %
Platelet Count: 162 10*3/uL (ref 150–400)
RBC: 5.04 MIL/uL (ref 3.87–5.11)
RDW: 12.9 % (ref 11.5–15.5)
WBC Count: 6.9 10*3/uL (ref 4.0–10.5)
nRBC: 0 % (ref 0.0–0.2)

## 2020-06-22 MED ORDER — SODIUM CHLORIDE (PF) 0.9 % IJ SOLN
INTRAMUSCULAR | Status: AC
  Filled 2020-06-22: qty 50

## 2020-06-22 MED ORDER — IOHEXOL 300 MG/ML  SOLN
100.0000 mL | Freq: Once | INTRAMUSCULAR | Status: AC | PRN
  Administered 2020-06-22: 100 mL via INTRAVENOUS

## 2020-06-23 ENCOUNTER — Inpatient Hospital Stay (HOSPITAL_BASED_OUTPATIENT_CLINIC_OR_DEPARTMENT_OTHER): Payer: Medicare Other | Admitting: Oncology

## 2020-06-23 ENCOUNTER — Other Ambulatory Visit: Payer: Self-pay

## 2020-06-23 VITALS — BP 129/57 | HR 73 | Temp 97.6°F | Resp 18 | Ht 67.5 in | Wt 193.2 lb

## 2020-06-23 DIAGNOSIS — C9110 Chronic lymphocytic leukemia of B-cell type not having achieved remission: Secondary | ICD-10-CM

## 2020-06-23 DIAGNOSIS — E119 Type 2 diabetes mellitus without complications: Secondary | ICD-10-CM | POA: Diagnosis not present

## 2020-06-23 DIAGNOSIS — I2510 Atherosclerotic heart disease of native coronary artery without angina pectoris: Secondary | ICD-10-CM | POA: Diagnosis not present

## 2020-06-23 DIAGNOSIS — J449 Chronic obstructive pulmonary disease, unspecified: Secondary | ICD-10-CM | POA: Diagnosis not present

## 2020-06-23 DIAGNOSIS — G35 Multiple sclerosis: Secondary | ICD-10-CM | POA: Diagnosis not present

## 2020-06-23 NOTE — Progress Notes (Signed)
[place] OFFICE PROGRESS NOTE   Diagnosis: CLL  INTERVAL HISTORY:   Rachel Coffey returns as scheduled.  She continues ibrutinib.  No rash or diarrhea.  She has mild bruising.  No other complaint.  Objective:  Vital signs in last 24 hours:  Blood pressure (!) 129/57, pulse 73, temperature 97.6 F (36.4 C), temperature source Tympanic, resp. rate 18, height 5' 7.5" (1.715 m), weight 193 lb 3.2 oz (87.6 kg), SpO2 95 %.    Lymphatics: No cervical, supraclavicular, axillary, or inguinal nodes Resp: Lungs clear bilaterally Cardio: Regular rate and rhythm GI: No mass, no hepatosplenomegaly, nontender Vascular: No leg edema  Skin: No rash     Lab Results:  Lab Results  Component Value Date   WBC 6.9 06/22/2020   HGB 14.1 06/22/2020   HCT 44.7 06/22/2020   MCV 88.7 06/22/2020   PLT 162 06/22/2020   NEUTROABS 4.3 06/22/2020    CMP  Lab Results  Component Value Date   NA 139 06/22/2020   K 4.1 06/22/2020   CL 102 06/22/2020   CO2 29 06/22/2020   GLUCOSE 107 (H) 06/22/2020   BUN 15 06/22/2020   CREATININE 0.80 06/22/2020   CALCIUM 10.3 06/22/2020   PROT 6.6 06/22/2020   ALBUMIN 3.9 06/22/2020   AST 15 06/22/2020   ALT 13 06/22/2020   ALKPHOS 67 06/22/2020   BILITOT 0.7 06/22/2020   GFRNONAA >60 06/22/2020   GFRAA >60 06/22/2020     Imaging:  CT CHEST W CONTRAST  Result Date: 06/22/2020 CLINICAL DATA:  CLL diagnosed 2017, melanoma diagnosed 2017, immunotherapy in progress. Status post cholecystectomy and hysterectomy. EXAM: CT CHEST, ABDOMEN, AND PELVIS WITH CONTRAST TECHNIQUE: Multidetector CT imaging of the chest, abdomen and pelvis was performed following the standard protocol during bolus administration of intravenous contrast. CONTRAST:  147m OMNIPAQUE IOHEXOL 300 MG/ML  SOLN COMPARISON:  CT abdomen/pelvis dated 11/19/2019. CT chest abdomen pelvis dated 07/04/2019. FINDINGS: CT CHEST FINDINGS Cardiovascular: Heart is normal in size.  No  pericardial effusion. No evidence of thoracic aortic aneurysm. Atherosclerotic calcifications of the aortic arch. Mild coronary atherosclerosis the LAD and right coronary artery. Mediastinum/Nodes: No suspicious mediastinal, hilar, or axillary lymphadenopathy. Visualized right thyroid is mildly heterogeneous but without suspicious nodule. Lungs/Pleura: Mild biapical pleural-parenchymal scarring. No focal consolidation. Subpleural scarring in the lateral left lung apex (series 5/image 23). Scattered small bilateral pulmonary nodules measuring up to 4 mm, unchanged. Stable branching mucous plugging in the posterior left upper lobe (series 5/image 45). No new/suspicious pulmonary nodules. No pleural effusion or pneumothorax. Musculoskeletal: Mild degenerative changes of the mid thoracic spine. CT ABDOMEN PELVIS FINDINGS Hepatobiliary: Scattered hepatic cysts, measuring up to 12 mm in the posterior right hepatic lobe (series 2/image 56). Status post cholecystectomy. No intrahepatic or extrahepatic ductal dilatation. Pancreas: Within normal limits. Spleen: Within normal limits. Adrenals/Urinary Tract: Adrenal glands are within normal limits. Kidneys are within normal limits.  No hydronephrosis. Bladder is within normal limits. Stomach/Bowel: Stomach is within normal limits. No evidence of bowel obstruction. Normal appendix (series 2/image 99). Left colonic diverticulosis, without evidence of diverticulitis. Vascular/Lymphatic: No evidence of abdominal aortic aneurysm. Atherosclerotic calcifications of the abdominal aorta and branch vessels. Two upper abdominal lymph nodes, including a dominant 15 mm short axis node in the porta hepatis (series 2/image 52), previously 2.0 cm. Reproductive: Status post hysterectomy. No adnexal masses. Other: No abdominopelvic ascites. Musculoskeletal: Lumbar spine is within normal limits. IMPRESSION: Mild upper abdominal lymphadenopathy, improved. Spleen is normal in size. No evidence  of  lymphomatous involvement in the chest. Additional stable ancillary findings as above. Electronically Signed   By: Julian Hy M.D.   On: 06/22/2020 13:45   CT ABDOMEN PELVIS W CONTRAST  Result Date: 06/22/2020 CLINICAL DATA:  CLL diagnosed 2017, melanoma diagnosed 2017, immunotherapy in progress. Status post cholecystectomy and hysterectomy. EXAM: CT CHEST, ABDOMEN, AND PELVIS WITH CONTRAST TECHNIQUE: Multidetector CT imaging of the chest, abdomen and pelvis was performed following the standard protocol during bolus administration of intravenous contrast. CONTRAST:  146m OMNIPAQUE IOHEXOL 300 MG/ML  SOLN COMPARISON:  CT abdomen/pelvis dated 11/19/2019. CT chest abdomen pelvis dated 07/04/2019. FINDINGS: CT CHEST FINDINGS Cardiovascular: Heart is normal in size.  No pericardial effusion. No evidence of thoracic aortic aneurysm. Atherosclerotic calcifications of the aortic arch. Mild coronary atherosclerosis the LAD and right coronary artery. Mediastinum/Nodes: No suspicious mediastinal, hilar, or axillary lymphadenopathy. Visualized right thyroid is mildly heterogeneous but without suspicious nodule. Lungs/Pleura: Mild biapical pleural-parenchymal scarring. No focal consolidation. Subpleural scarring in the lateral left lung apex (series 5/image 23). Scattered small bilateral pulmonary nodules measuring up to 4 mm, unchanged. Stable branching mucous plugging in the posterior left upper lobe (series 5/image 45). No new/suspicious pulmonary nodules. No pleural effusion or pneumothorax. Musculoskeletal: Mild degenerative changes of the mid thoracic spine. CT ABDOMEN PELVIS FINDINGS Hepatobiliary: Scattered hepatic cysts, measuring up to 12 mm in the posterior right hepatic lobe (series 2/image 56). Status post cholecystectomy. No intrahepatic or extrahepatic ductal dilatation. Pancreas: Within normal limits. Spleen: Within normal limits. Adrenals/Urinary Tract: Adrenal glands are within normal limits. Kidneys  are within normal limits.  No hydronephrosis. Bladder is within normal limits. Stomach/Bowel: Stomach is within normal limits. No evidence of bowel obstruction. Normal appendix (series 2/image 99). Left colonic diverticulosis, without evidence of diverticulitis. Vascular/Lymphatic: No evidence of abdominal aortic aneurysm. Atherosclerotic calcifications of the abdominal aorta and branch vessels. Two upper abdominal lymph nodes, including a dominant 15 mm short axis node in the porta hepatis (series 2/image 52), previously 2.0 cm. Reproductive: Status post hysterectomy. No adnexal masses. Other: No abdominopelvic ascites. Musculoskeletal: Lumbar spine is within normal limits. IMPRESSION: Mild upper abdominal lymphadenopathy, improved. Spleen is normal in size. No evidence of lymphomatous involvement in the chest. Additional stable ancillary findings as above. Electronically Signed   By: SJulian HyM.D.   On: 06/22/2020 13:45    Medications: I have reviewed the patient's current medications.   Assessment/Plan: 1. 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 Monoclonal Kappa restricted B-cell population identified onflow cytometry ? Staging PET scan 06/13/2016 confirmed hypermetabolic abdominal/pelvic lymphadenopathy ? FISHpanel positive for 11q-,-12,13q-, and 17p-(10% of cells) ? Peripheral blood flow cytometry 09/22/2016 at [facility]-CD5/CD23 positive For restricted B-cell population, 2% of lymphocytes ? [facility] ? CT 06/06/2017-generally stable diffuse lymphadenopathy, node in the pelvis is smaller, some the lymph nodes are larger ? CTs 07/02/2018- slight decrease in size of retrocrural, retroperitoneal, and mesenteric adenopathy, new 6 mm left upper lobe nodule ? CT chest 01/01/2019- increase in size of left upper lobe lung nodule-inflammatory appearance, other stable lung nodules, right  retrocrural and upper abdominal adenopathy has progressed ? CTs 07/04/2019- progressive lower thoracic and abdominal adenopathy with dominant mass-effect on the stomach and pancreas, new heterogeneity in the spleen- possible splenic lesion, stable lung nodules ? Ibrutinib starting 08/14/2019 ? CT abdomen/pelvis 11/19/2019-decreased size of dominant abdominal mass and decreased abdominal lymphadenopathy ? CTs 06/22/2020-2 upper abdominal lymph nodes have decreased in size, no  evidence of disease progression, unchanged bilateral lung nodules  2. Stage IB (T1b,N0) melanoma of the right forearm, status post a wide excision and sentinel lymph node biopsy at [facility] March 2017   3. Multiple sclerosis  4. Diabetes  5.   COPD     Disposition: Rachel Coffey appears stable.  The upper abdominal lymphadenopathy appears improved.  There is no clinical or radiologic evidence of disease progression.  She is tolerating the ibrutinib well.  The plan is to continue ibrutinib.  Rachel Coffey will return for an office visit in 3 months.  She will watch for recommendations regarding a COVID-19 booster vaccination.  [name], MD  06/23/2020  11:35 AM

## 2020-06-24 ENCOUNTER — Telehealth: Payer: Self-pay | Admitting: Nurse Practitioner

## 2020-06-24 DIAGNOSIS — M79671 Pain in right foot: Secondary | ICD-10-CM | POA: Diagnosis not present

## 2020-06-24 DIAGNOSIS — M79672 Pain in left foot: Secondary | ICD-10-CM | POA: Diagnosis not present

## 2020-06-24 DIAGNOSIS — G629 Polyneuropathy, unspecified: Secondary | ICD-10-CM | POA: Diagnosis not present

## 2020-06-24 DIAGNOSIS — R262 Difficulty in walking, not elsewhere classified: Secondary | ICD-10-CM | POA: Diagnosis not present

## 2020-06-24 NOTE — Telephone Encounter (Signed)
Scheduled appointments per 8/10 los. Left message with appointments date and times.

## 2020-06-26 ENCOUNTER — Encounter: Payer: Self-pay | Admitting: Oncology

## 2020-06-29 DIAGNOSIS — M79672 Pain in left foot: Secondary | ICD-10-CM | POA: Diagnosis not present

## 2020-06-29 DIAGNOSIS — R262 Difficulty in walking, not elsewhere classified: Secondary | ICD-10-CM | POA: Diagnosis not present

## 2020-06-29 DIAGNOSIS — G629 Polyneuropathy, unspecified: Secondary | ICD-10-CM | POA: Diagnosis not present

## 2020-06-29 DIAGNOSIS — M79671 Pain in right foot: Secondary | ICD-10-CM | POA: Diagnosis not present

## 2020-06-30 ENCOUNTER — Encounter: Payer: Self-pay | Admitting: Oncology

## 2020-06-30 DIAGNOSIS — Z23 Encounter for immunization: Secondary | ICD-10-CM | POA: Diagnosis not present

## 2020-07-08 ENCOUNTER — Other Ambulatory Visit: Payer: Self-pay | Admitting: Oncology

## 2020-07-08 DIAGNOSIS — C9110 Chronic lymphocytic leukemia of B-cell type not having achieved remission: Secondary | ICD-10-CM

## 2020-07-13 DIAGNOSIS — Z780 Asymptomatic menopausal state: Secondary | ICD-10-CM | POA: Diagnosis not present

## 2020-07-13 DIAGNOSIS — I700 Atherosclerosis of aorta: Secondary | ICD-10-CM | POA: Diagnosis not present

## 2020-07-13 DIAGNOSIS — G629 Polyneuropathy, unspecified: Secondary | ICD-10-CM | POA: Diagnosis not present

## 2020-07-13 DIAGNOSIS — E1169 Type 2 diabetes mellitus with other specified complication: Secondary | ICD-10-CM | POA: Diagnosis not present

## 2020-07-13 DIAGNOSIS — E669 Obesity, unspecified: Secondary | ICD-10-CM | POA: Diagnosis not present

## 2020-07-13 DIAGNOSIS — C9110 Chronic lymphocytic leukemia of B-cell type not having achieved remission: Secondary | ICD-10-CM | POA: Diagnosis not present

## 2020-07-13 DIAGNOSIS — H1032 Unspecified acute conjunctivitis, left eye: Secondary | ICD-10-CM | POA: Diagnosis not present

## 2020-07-13 DIAGNOSIS — Z6830 Body mass index (BMI) 30.0-30.9, adult: Secondary | ICD-10-CM | POA: Diagnosis not present

## 2020-07-13 DIAGNOSIS — Z0000 Encounter for general adult medical examination without abnormal findings: Secondary | ICD-10-CM | POA: Diagnosis not present

## 2020-07-13 DIAGNOSIS — E785 Hyperlipidemia, unspecified: Secondary | ICD-10-CM | POA: Diagnosis not present

## 2020-07-13 MED FILL — IMBRUVICA 420 MG TAB: 420 | 28 days supply | Qty: 28 | Fill #0

## 2020-07-17 DIAGNOSIS — H00025 Hordeolum internum left lower eyelid: Secondary | ICD-10-CM | POA: Diagnosis not present

## 2020-07-17 DIAGNOSIS — H2513 Age-related nuclear cataract, bilateral: Secondary | ICD-10-CM | POA: Diagnosis not present

## 2020-07-17 DIAGNOSIS — H0102A Squamous blepharitis right eye, upper and lower eyelids: Secondary | ICD-10-CM | POA: Diagnosis not present

## 2020-07-17 DIAGNOSIS — H0102B Squamous blepharitis left eye, upper and lower eyelids: Secondary | ICD-10-CM | POA: Diagnosis not present

## 2020-07-24 DIAGNOSIS — Z780 Asymptomatic menopausal state: Secondary | ICD-10-CM | POA: Diagnosis not present

## 2020-07-28 DIAGNOSIS — H00025 Hordeolum internum left lower eyelid: Secondary | ICD-10-CM | POA: Diagnosis not present

## 2020-08-12 MED FILL — IMBRUVICA 420 MG TAB: 420 | 28 days supply | Qty: 28 | Fill #1

## 2020-08-17 DIAGNOSIS — Z23 Encounter for immunization: Secondary | ICD-10-CM | POA: Diagnosis not present

## 2020-08-19 DIAGNOSIS — G35 Multiple sclerosis: Secondary | ICD-10-CM | POA: Diagnosis not present

## 2020-08-25 DIAGNOSIS — H00025 Hordeolum internum left lower eyelid: Secondary | ICD-10-CM | POA: Diagnosis not present

## 2020-09-10 MED FILL — IMBRUVICA 420 MG TAB: 420 | 28 days supply | Qty: 28 | Fill #2

## 2020-09-23 ENCOUNTER — Inpatient Hospital Stay: Payer: Medicare Other

## 2020-09-23 ENCOUNTER — Inpatient Hospital Stay: Payer: Medicare Other | Admitting: Nurse Practitioner

## 2020-09-24 ENCOUNTER — Other Ambulatory Visit: Payer: Self-pay

## 2020-09-24 ENCOUNTER — Inpatient Hospital Stay: Payer: Medicare Other

## 2020-09-24 ENCOUNTER — Inpatient Hospital Stay: Payer: Medicare Other | Attending: Nurse Practitioner | Admitting: Oncology

## 2020-09-24 VITALS — BP 126/61 | HR 75 | Temp 98.4°F | Resp 18 | Ht 67.5 in | Wt 191.4 lb

## 2020-09-24 DIAGNOSIS — R109 Unspecified abdominal pain: Secondary | ICD-10-CM | POA: Insufficient documentation

## 2020-09-24 DIAGNOSIS — M2550 Pain in unspecified joint: Secondary | ICD-10-CM | POA: Insufficient documentation

## 2020-09-24 DIAGNOSIS — Z85820 Personal history of malignant melanoma of skin: Secondary | ICD-10-CM | POA: Diagnosis not present

## 2020-09-24 DIAGNOSIS — R197 Diarrhea, unspecified: Secondary | ICD-10-CM | POA: Insufficient documentation

## 2020-09-24 DIAGNOSIS — E119 Type 2 diabetes mellitus without complications: Secondary | ICD-10-CM | POA: Insufficient documentation

## 2020-09-24 DIAGNOSIS — G35 Multiple sclerosis: Secondary | ICD-10-CM | POA: Insufficient documentation

## 2020-09-24 DIAGNOSIS — C9110 Chronic lymphocytic leukemia of B-cell type not having achieved remission: Secondary | ICD-10-CM | POA: Diagnosis not present

## 2020-09-24 DIAGNOSIS — J449 Chronic obstructive pulmonary disease, unspecified: Secondary | ICD-10-CM | POA: Insufficient documentation

## 2020-09-24 LAB — LACTATE DEHYDROGENASE: LDH: 134 U/L (ref 98–192)

## 2020-09-24 LAB — CBC WITH DIFFERENTIAL (CANCER CENTER ONLY)
Abs Immature Granulocytes: 0.03 10*3/uL (ref 0.00–0.07)
Basophils Absolute: 0.1 10*3/uL (ref 0.0–0.1)
Basophils Relative: 1 %
Eosinophils Absolute: 0.1 10*3/uL (ref 0.0–0.5)
Eosinophils Relative: 1 %
HCT: 43.5 % (ref 36.0–46.0)
Hemoglobin: 14.1 g/dL (ref 12.0–15.0)
Immature Granulocytes: 0 %
Lymphocytes Relative: 19 %
Lymphs Abs: 1.8 10*3/uL (ref 0.7–4.0)
MCH: 28.3 pg (ref 26.0–34.0)
MCHC: 32.4 g/dL (ref 30.0–36.0)
MCV: 87.3 fL (ref 80.0–100.0)
Monocytes Absolute: 0.7 10*3/uL (ref 0.1–1.0)
Monocytes Relative: 7 %
Neutro Abs: 7 10*3/uL (ref 1.7–7.7)
Neutrophils Relative %: 72 %
Platelet Count: 164 10*3/uL (ref 150–400)
RBC: 4.98 MIL/uL (ref 3.87–5.11)
RDW: 12.9 % (ref 11.5–15.5)
WBC Count: 9.6 10*3/uL (ref 4.0–10.5)
nRBC: 0 % (ref 0.0–0.2)

## 2020-09-24 LAB — CMP (CANCER CENTER ONLY)
ALT: 11 U/L (ref 0–44)
AST: 17 U/L (ref 15–41)
Albumin: 3.9 g/dL (ref 3.5–5.0)
Alkaline Phosphatase: 77 U/L (ref 38–126)
Anion gap: 5 (ref 5–15)
BUN: 17 mg/dL (ref 8–23)
CO2: 33 mmol/L — ABNORMAL HIGH (ref 22–32)
Calcium: 9.6 mg/dL (ref 8.9–10.3)
Chloride: 104 mmol/L (ref 98–111)
Creatinine: 0.95 mg/dL (ref 0.44–1.00)
GFR, Estimated: >60 mL/min (ref >60)
Glucose, Bld: 113 mg/dL — ABNORMAL HIGH (ref 70–99)
Potassium: 4.7 mmol/L (ref 3.5–5.1)
Sodium: 142 mmol/L (ref 135–145)
Total Bilirubin: 0.7 mg/dL (ref 0.3–1.2)
Total Protein: 6.5 g/dL (ref 6.5–8.1)

## 2020-09-24 NOTE — Progress Notes (Signed)
  [facility] OFFICE PROGRESS NOTE   Diagnosis: CLL  INTERVAL HISTORY:   Rachel Coffey returns as scheduled.  She continues ibrutinib.  Stable chronic arthralgias.  Easy bruising.  She reports 2 episodes of abdominal discomfort and diarrhea occurring approximately 1.5 weeks apart over the past month.  No consistent abdominal pain or diarrhea.  No other complaint.  Objective:  Vital signs in last 24 hours:  Blood pressure 126/61, pulse 75, temperature 98.4 F (36.9 C), temperature source Tympanic, resp. rate 18, height 5' 7.5" (1.715 m), weight 191 lb 6.4 oz (86.8 kg), SpO2 96 %.   Lymphatics: No cervical, supraclavicular, axillary, or inguinal nodes Resp: Lungs clear bilaterally Cardio: Regular rate and rhythm GI: No hepatosplenomegaly, no mass, nontender Vascular: No leg edema Skin: Small ecchymosis at the left forearm   Lab Results:  Lab Results  Component Value Date   WBC 9.6 09/24/2020   HGB 14.1 09/24/2020   HCT 43.5 09/24/2020   MCV 87.3 09/24/2020   PLT 164 09/24/2020   NEUTROABS 7.0 09/24/2020    CMP  Lab Results  Component Value Date   NA 142 09/24/2020   K 4.7 09/24/2020   CL 104 09/24/2020   CO2 33 (H) 09/24/2020   GLUCOSE 113 (H) 09/24/2020   BUN 17 09/24/2020   CREATININE 0.95 09/24/2020   CALCIUM 9.6 09/24/2020   PROT 6.5 09/24/2020   ALBUMIN 3.9 09/24/2020   AST 17 09/24/2020   ALT 11 09/24/2020   ALKPHOS 77 09/24/2020   BILITOT 0.7 09/24/2020   GFRNONAA >60 09/24/2020   GFRAA >60 06/22/2020    Medications: I have reviewed the patient's current medications.   Assessment/Plan:  1. 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 Monoclonal Kappa restricted B-cell population identified onflow cytometry ? Staging PET scan 06/13/2016 confirmed hypermetabolic abdominal/pelvic lymphadenopathy ? FISHpanel positive for 11q-,-12,13q-, and 17p-(10% of cells) ? Peripheral blood flow  cytometry 09/22/2016 at [facility]-CD5/CD23 positive For restricted B-cell population, 2% of lymphocytes ? Peripheral blood IGH mutation analysis at [facility] 09/22/2016-unmutated ? CT 06/06/2017-generally stable diffuse lymphadenopathy, node in the pelvis is smaller, some the lymph nodes are larger ? CTs 07/02/2018- slight decrease in size of retrocrural, retroperitoneal, and mesenteric adenopathy, new 6 mm left upper lobe nodule ? CT chest 01/01/2019- increase in size of left upper lobe lung nodule-inflammatory appearance, other stable lung nodules, right retrocrural and upper abdominal adenopathy has progressed ? CTs 07/04/2019- progressive lower thoracic and abdominal adenopathy with dominant mass-effect on the stomach and pancreas, new heterogeneity in the spleen- possible splenic lesion, stable lung nodules ? Ibrutinib starting 08/14/2019 ? CT abdomen/pelvis 11/19/2019-decreased size of dominant abdominal mass and decreased abdominal lymphadenopathy ? CTs 06/22/2020-2 upper abdominal lymph nodes have decreased in size, no evidence of disease progression, unchanged bilateral lung nodules  2. Stage IB (T1b,N0) melanoma of the right forearm, status post a wide excision and sentinel lymph node biopsy at [facility] March 2017   3. Multiple sclerosis  4. Diabetes  5.   COPD      Disposition: Ms. Kozuch appears stable.  She is tolerating the ibrutinib well.  There is no clinical evidence for progression of the CLL.  The 2 episodes of abdominal discomfort and diarrhea over the past month are most likely unrelated to ibrutinib or CLL.  She will contact us for consistent symptoms.  Ms. Sallas will return for an office visit in 4 months.  [name], MD  09/24/2020  3:34 PM

## 2020-09-30 DIAGNOSIS — Z85820 Personal history of malignant melanoma of skin: Secondary | ICD-10-CM | POA: Diagnosis not present

## 2020-09-30 DIAGNOSIS — L570 Actinic keratosis: Secondary | ICD-10-CM | POA: Diagnosis not present

## 2020-09-30 DIAGNOSIS — D485 Neoplasm of uncertain behavior of skin: Secondary | ICD-10-CM | POA: Diagnosis not present

## 2020-09-30 DIAGNOSIS — Z808 Family history of malignant neoplasm of other organs or systems: Secondary | ICD-10-CM | POA: Diagnosis not present

## 2020-09-30 DIAGNOSIS — L910 Hypertrophic scar: Secondary | ICD-10-CM | POA: Diagnosis not present

## 2020-09-30 DIAGNOSIS — D225 Melanocytic nevi of trunk: Secondary | ICD-10-CM | POA: Diagnosis not present

## 2020-09-30 DIAGNOSIS — L578 Other skin changes due to chronic exposure to nonionizing radiation: Secondary | ICD-10-CM | POA: Diagnosis not present

## 2020-09-30 DIAGNOSIS — L821 Other seborrheic keratosis: Secondary | ICD-10-CM | POA: Diagnosis not present

## 2020-09-30 DIAGNOSIS — L723 Sebaceous cyst: Secondary | ICD-10-CM | POA: Diagnosis not present

## 2020-09-30 DIAGNOSIS — L986 Other infiltrative disorders of the skin and subcutaneous tissue: Secondary | ICD-10-CM | POA: Diagnosis not present

## 2020-09-30 DIAGNOSIS — Z85828 Personal history of other malignant neoplasm of skin: Secondary | ICD-10-CM | POA: Diagnosis not present

## 2020-10-01 ENCOUNTER — Other Ambulatory Visit: Payer: Self-pay | Admitting: Oncology

## 2020-10-01 DIAGNOSIS — E1169 Type 2 diabetes mellitus with other specified complication: Secondary | ICD-10-CM | POA: Diagnosis not present

## 2020-10-01 DIAGNOSIS — J45909 Unspecified asthma, uncomplicated: Secondary | ICD-10-CM | POA: Diagnosis not present

## 2020-10-01 DIAGNOSIS — C9110 Chronic lymphocytic leukemia of B-cell type not having achieved remission: Secondary | ICD-10-CM

## 2020-10-01 DIAGNOSIS — M1990 Unspecified osteoarthritis, unspecified site: Secondary | ICD-10-CM | POA: Diagnosis not present

## 2020-10-01 DIAGNOSIS — K219 Gastro-esophageal reflux disease without esophagitis: Secondary | ICD-10-CM | POA: Diagnosis not present

## 2020-10-01 DIAGNOSIS — E785 Hyperlipidemia, unspecified: Secondary | ICD-10-CM | POA: Diagnosis not present

## 2020-10-06 MED FILL — IMBRUVICA 420 MG TAB: 420 | 28 days supply | Qty: 28 | Fill #0

## 2020-10-29 DIAGNOSIS — M542 Cervicalgia: Secondary | ICD-10-CM | POA: Diagnosis not present

## 2020-10-29 DIAGNOSIS — Z6830 Body mass index (BMI) 30.0-30.9, adult: Secondary | ICD-10-CM | POA: Diagnosis not present

## 2020-11-05 MED FILL — IMBRUVICA 420 MG TAB: 420 | 28 days supply | Qty: 28 | Fill #1

## 2020-11-18 DIAGNOSIS — R0981 Nasal congestion: Secondary | ICD-10-CM | POA: Diagnosis not present

## 2020-11-18 DIAGNOSIS — J029 Acute pharyngitis, unspecified: Secondary | ICD-10-CM | POA: Diagnosis not present

## 2020-11-18 DIAGNOSIS — R059 Cough, unspecified: Secondary | ICD-10-CM | POA: Diagnosis not present

## 2020-11-19 ENCOUNTER — Other Ambulatory Visit: Payer: Medicare Other

## 2020-12-01 ENCOUNTER — Telehealth: Payer: Self-pay

## 2020-12-01 MED FILL — IMBRUVICA 420 MG TAB: 420 | 28 days supply | Qty: 28 | Fill #2

## 2020-12-01 NOTE — Telephone Encounter (Signed)
Oral Oncology Patient Advocate Encounter  Was successful in securing patient a $8000 grant from [employer] to provide copayment coverage for Imbruvica.  This will keep the out of pocket expense at $0.     [employer] ID: [id number]  I have spoken with the patient.   The billing information is as follows and has been shared with [place]: [id number] PCN: [id number] Member ID: [id number] Group ID: [id number] Dates of Eligibility: 11/01/20 through 10/31/21  Fund:  [facility] [place] Phone [phone] Fax [phone] 12/01/2020 1:28 PM

## 2020-12-03 ENCOUNTER — Ambulatory Visit: Payer: Medicare Other | Admitting: Podiatry

## 2020-12-16 DIAGNOSIS — R1013 Epigastric pain: Secondary | ICD-10-CM | POA: Insufficient documentation

## 2020-12-16 DIAGNOSIS — K219 Gastro-esophageal reflux disease without esophagitis: Secondary | ICD-10-CM | POA: Insufficient documentation

## 2020-12-16 DIAGNOSIS — F419 Anxiety disorder, unspecified: Secondary | ICD-10-CM | POA: Insufficient documentation

## 2020-12-16 DIAGNOSIS — G622 Polyneuropathy due to other toxic agents: Secondary | ICD-10-CM | POA: Insufficient documentation

## 2020-12-16 DIAGNOSIS — Z8572 Personal history of non-Hodgkin lymphomas: Secondary | ICD-10-CM | POA: Insufficient documentation

## 2020-12-16 DIAGNOSIS — R197 Diarrhea, unspecified: Secondary | ICD-10-CM | POA: Insufficient documentation

## 2020-12-16 DIAGNOSIS — R252 Cramp and spasm: Secondary | ICD-10-CM | POA: Insufficient documentation

## 2020-12-16 DIAGNOSIS — M25569 Pain in unspecified knee: Secondary | ICD-10-CM | POA: Insufficient documentation

## 2020-12-16 DIAGNOSIS — I700 Atherosclerosis of aorta: Secondary | ICD-10-CM | POA: Insufficient documentation

## 2020-12-16 DIAGNOSIS — E785 Hyperlipidemia, unspecified: Secondary | ICD-10-CM | POA: Insufficient documentation

## 2020-12-16 DIAGNOSIS — Z85828 Personal history of other malignant neoplasm of skin: Secondary | ICD-10-CM | POA: Insufficient documentation

## 2020-12-16 DIAGNOSIS — G619 Inflammatory polyneuropathy, unspecified: Secondary | ICD-10-CM | POA: Insufficient documentation

## 2020-12-16 DIAGNOSIS — K5730 Diverticulosis of large intestine without perforation or abscess without bleeding: Secondary | ICD-10-CM | POA: Insufficient documentation

## 2020-12-16 DIAGNOSIS — J45909 Unspecified asthma, uncomplicated: Secondary | ICD-10-CM | POA: Insufficient documentation

## 2020-12-16 DIAGNOSIS — J310 Chronic rhinitis: Secondary | ICD-10-CM | POA: Insufficient documentation

## 2020-12-16 DIAGNOSIS — C8593 Non-Hodgkin lymphoma, unspecified, intra-abdominal lymph nodes: Secondary | ICD-10-CM | POA: Insufficient documentation

## 2020-12-16 DIAGNOSIS — E669 Obesity, unspecified: Secondary | ICD-10-CM | POA: Insufficient documentation

## 2020-12-16 DIAGNOSIS — E559 Vitamin D deficiency, unspecified: Secondary | ICD-10-CM | POA: Insufficient documentation

## 2020-12-16 DIAGNOSIS — Z8579 Personal history of other malignant neoplasms of lymphoid, hematopoietic and related tissues: Secondary | ICD-10-CM | POA: Insufficient documentation

## 2020-12-16 DIAGNOSIS — M1990 Unspecified osteoarthritis, unspecified site: Secondary | ICD-10-CM | POA: Insufficient documentation

## 2020-12-17 ENCOUNTER — Ambulatory Visit (INDEPENDENT_AMBULATORY_CARE_PROVIDER_SITE_OTHER): Payer: Medicare Other | Admitting: Podiatry

## 2020-12-17 ENCOUNTER — Other Ambulatory Visit: Payer: Self-pay

## 2020-12-17 ENCOUNTER — Encounter: Payer: Self-pay | Admitting: Podiatry

## 2020-12-17 DIAGNOSIS — M19079 Primary osteoarthritis, unspecified ankle and foot: Secondary | ICD-10-CM

## 2020-12-17 DIAGNOSIS — M76811 Anterior tibial syndrome, right leg: Secondary | ICD-10-CM

## 2020-12-17 NOTE — Progress Notes (Signed)
Subjective:  Patient ID: Rachel Coffey, female    DOB: 07-Mar-1951,  MRN: [id number]  Chief Complaint  Patient presents with  . Nail Problem  . Foot Pain    Patient presents today for ingrown toenail right 3rd toe and left great toenail  She also c/o pain top of right midfoot x 2 month    70 y.o. female presents with the above complaint.  Patient presents with complaint of right anterior medial foot pain.  Patient states been going for quite some time.  Patient states that she sits in a certain position that may have aggravated it.  She has not experienced this in a while.  She denies any other acute complaints she has not tried any treatment options she would like to discuss treatment options.   Review of Systems: Negative except as noted in the HPI. Denies N/V/F/Ch.  Past Medical History:  Diagnosis Date  . Asthma    stress induced with allergies  . Diabetes mellitus without complication ([place])   . Genital warts   . Hyperlipidemia   . MS (multiple sclerosis) ([place])   . Neuromuscular disorder ([place])    dx. "74- MS" -Dr. Barkley Boards, [place](Neuropathy and "band around the chest"  . STD (sexually transmitted disease)    genital warts--years ago    Current Outpatient Medications:  .  trimethoprim-polymyxin b (POLYTRIM) ophthalmic solution, 1-2  drops into affected eye, Disp: , Rfl:  .  albuterol (PROVENTIL HFA;VENTOLIN HFA) 108 (90 BASE) MCG/ACT inhaler, Inhale 2 puffs into the lungs every 6 (six) hours as needed for wheezing., Disp: , Rfl:  .  ALPRAZolam (XANAX) 0.5 MG tablet, Take 0.5 mg by mouth daily as needed (for pain). , Disp: , Rfl:  .  cholecalciferol (VITAMIN D) 1000 UNITS tablet, Take 2,000 Units by mouth daily. , Disp: , Rfl:  .  CINNAMON PO, Take 1,000 mg by mouth daily.  (Patient not taking: Reported on 06/23/2020), Disp: , Rfl:  .  cyclobenzaprine (FLEXERIL) 10 MG tablet, Take 10 mg by mouth 3 (three) times daily as needed for muscle spasms., Disp: , Rfl:  .   esomeprazole (NEXIUM) 20 MG capsule, Take 20 mg by mouth daily at 12 noon. , Disp: , Rfl:  .  fluticasone (FLONASE) 50 MCG/ACT nasal spray, Place 1 spray into both nostrils daily. Sometimes bid, Disp: , Rfl:  .  gabapentin (NEURONTIN) 100 MG capsule, Take 100 mg by mouth once., Disp: , Rfl:  .  glipiZIDE (GLUCOTROL XL) 2.5 MG 24 hr tablet, Take 2.5 mg by mouth 2 (two) times daily. , Disp: , Rfl:  .  IMBRUVICA 420 MG TABS, TAKE 1 TABLET (420 MG) BY MOUTH DAILY. ADMINISTER WITH WATER AT APPROXIMATELY THE SAME TIME EACH DAY., Disp: 28 tablet, Rfl: 3 .  Loratadine 10 MG CAPS, Take 10 mg by mouth daily as needed., Disp: , Rfl:  .  losartan (COZAAR) 25 MG tablet, Take 25 mg by mouth daily., Disp: , Rfl:  .  meloxicam (MOBIC) 15 MG tablet, Take 15 mg by mouth daily. Prn, Disp: , Rfl:  .  neomycin-polymyxin b-dexamethasone (MAXITROL) 3.5-10000-0.1 OINT, Place into the left eye 2 (two) times daily., Disp: , Rfl:  .  simvastatin (ZOCOR) 20 MG tablet, Take 20 mg by mouth at bedtime., Disp: , Rfl:   Social History   Tobacco Use  Smoking Status Former Smoker  . Types: Cigarettes  . Quit date: 01/31/2005  . Years since quitting: 15.8  Smokeless Tobacco Never Used  Allergies  Allergen Reactions  . Amoxicillin-Pot Clavulanate Other (See Comments)  . Codeine Itching  . Fenofibrate     aching joints  . Hydrocodone Itching  . Hydrocodone-Acetaminophen Other (See Comments)  . Lisinopril Other (See Comments)  . Metformin And Related    Objective:  There were no vitals filed for this visit. There is no height or weight on file to calculate BMI. Constitutional Well developed. Well nourished.  Vascular Dorsalis pedis pulses palpable bilaterally. Posterior tibial pulses palpable bilaterally. Capillary refill normal to all digits.  No cyanosis or clubbing noted. Pedal hair growth normal.  Neurologic Normal speech. Oriented to person, place, and time. Epicritic sensation to light touch grossly  present bilaterally.  Dermatologic Nails well groomed and normal in appearance. No open wounds. No skin lesions.  Orthopedic:  Pain on palpation to the insertion of the tibialis anterior tendon.  Pain with dorsiflexion inversion of the foot resisted.  No pain with plantarflexion inversion of the foot.  No pain with dorsiflexion eversion of the foot.  No pain at the ATFL ligament at the posterior tibial tendon at the Achilles tendon.   Radiographs: None Assessment:   1. Anterior tibialis tendinitis of right lower extremity   2. Arthritis of midfoot    Plan:  Patient was evaluated and treated and all questions answered.  Right tibialis anterior tendinitis versus possible midfoot arthritis -I explained the patient the etiology of the tibialis anterior tendinitis and various treatment options were extensively discussed.  Given the amount of pain that she is having I believe she will benefit from a steroid injection to help decrease acute inflammatory component associate with pain.  I discussed with her that given that this is near the tendon there is a risk of rupture associated with it.  Patient would like to proceed despite the risks. -If there is no improvement we will discuss cam boot immobilization versus MRI during next clinical visit -A steroid injection was performed at right medial foot using 1% plain Lidocaine and 10 mg of Kenalog. This was well tolerated.   No follow-ups on file.

## 2020-12-24 ENCOUNTER — Encounter: Payer: Self-pay | Admitting: Podiatry

## 2020-12-24 ENCOUNTER — Ambulatory Visit (INDEPENDENT_AMBULATORY_CARE_PROVIDER_SITE_OTHER): Payer: Medicare Other | Admitting: Podiatry

## 2020-12-24 ENCOUNTER — Other Ambulatory Visit: Payer: Self-pay

## 2020-12-24 DIAGNOSIS — L600 Ingrowing nail: Secondary | ICD-10-CM | POA: Diagnosis not present

## 2020-12-24 NOTE — Progress Notes (Signed)
Subjective:  Patient ID: Rachel Coffey, female    DOB: 1951-02-25,  MRN: [id number]  Chief Complaint  Patient presents with  . Ingrown Toenail    "the corner of my toe where he tried to cut the ingrown out is very sore and Im not sure that he got it all out last time"    70 y.o. female presents with the above complaint.  Patient presents with a new complaint of right third digit lateral border ingrown.  Is painful to touch.  She had it removed on the other side before she.  She would like to have it removed on this side.  She denies any other acute complaints.  She is doing okay from the tibialis anterior pain.  She wants to have removed   Review of Systems: Negative except as noted in the HPI. Denies N/V/F/Ch.  Past Medical History:  Diagnosis Date  . Asthma    stress induced with allergies  . Diabetes mellitus without complication ([place])   . Genital warts   . Hyperlipidemia   . MS (multiple sclerosis) ([place])   . Neuromuscular disorder ([place])    dx. "69- MS" -Dr. Barkley Boards, [place](Neuropathy and "band around the chest"  . STD (sexually transmitted disease)    genital warts--years ago    Current Outpatient Medications:  .  albuterol (PROVENTIL HFA;VENTOLIN HFA) 108 (90 BASE) MCG/ACT inhaler, Inhale 2 puffs into the lungs every 6 (six) hours as needed for wheezing., Disp: , Rfl:  .  ALPRAZolam (XANAX) 0.5 MG tablet, Take 0.5 mg by mouth daily as needed (for pain). , Disp: , Rfl:  .  cholecalciferol (VITAMIN D) 1000 UNITS tablet, Take 2,000 Units by mouth daily. , Disp: , Rfl:  .  CINNAMON PO, Take 1,000 mg by mouth daily.  (Patient not taking: Reported on 06/23/2020), Disp: , Rfl:  .  cyclobenzaprine (FLEXERIL) 10 MG tablet, Take 10 mg by mouth 3 (three) times daily as needed for muscle spasms., Disp: , Rfl:  .  esomeprazole (NEXIUM) 20 MG capsule, Take 20 mg by mouth daily at 12 noon. , Disp: , Rfl:  .  fluticasone (FLONASE) 50 MCG/ACT nasal spray, Place 1 spray into both nostrils  daily. Sometimes bid, Disp: , Rfl:  .  gabapentin (NEURONTIN) 100 MG capsule, Take 100 mg by mouth once., Disp: , Rfl:  .  glipiZIDE (GLUCOTROL XL) 2.5 MG 24 hr tablet, Take 2.5 mg by mouth 2 (two) times daily. , Disp: , Rfl:  .  IMBRUVICA 420 MG TABS, TAKE 1 TABLET (420 MG) BY MOUTH DAILY. ADMINISTER WITH WATER AT APPROXIMATELY THE SAME TIME EACH DAY., Disp: 28 tablet, Rfl: 3 .  Loratadine 10 MG CAPS, Take 10 mg by mouth daily as needed., Disp: , Rfl:  .  losartan (COZAAR) 25 MG tablet, Take 25 mg by mouth daily., Disp: , Rfl:  .  meloxicam (MOBIC) 15 MG tablet, Take 15 mg by mouth daily. Prn, Disp: , Rfl:  .  neomycin-polymyxin b-dexamethasone (MAXITROL) 3.5-10000-0.1 OINT, Place into the left eye 2 (two) times daily., Disp: , Rfl:  .  simvastatin (ZOCOR) 20 MG tablet, Take 20 mg by mouth at bedtime., Disp: , Rfl:  .  trimethoprim-polymyxin b (POLYTRIM) ophthalmic solution, 1-2  drops into affected eye, Disp: , Rfl:   Social History   Tobacco Use  Smoking Status Former Smoker  . Types: Cigarettes  . Quit date: 01/31/2005  . Years since quitting: 15.9  Smokeless Tobacco Never Used    Allergies  Allergen Reactions  . Amoxicillin-Pot Clavulanate Other (See Comments)  . Codeine Itching  . Fenofibrate     aching joints  . Hydrocodone Itching  . Hydrocodone-Acetaminophen Other (See Comments)  . Lisinopril Other (See Comments)  . Metformin And Related    Objective:  There were no vitals filed for this visit. There is no height or weight on file to calculate BMI. Constitutional Well developed. Well nourished.  Vascular Dorsalis pedis pulses palpable bilaterally. Posterior tibial pulses palpable bilaterally. Capillary refill normal to all digits.  No cyanosis or clubbing noted. Pedal hair growth normal.  Neurologic Normal speech. Oriented to person, place, and time. Epicritic sensation to light touch grossly present bilaterally.  Dermatologic Painful ingrowing nail at lateral  nail borders of the hallux nail right. No open wounds. No skin lesions.  Orthopedic:  Pain on palpation to the insertion of the tibialis anterior tendon.  Pain with dorsiflexion inversion of the foot resisted.  No pain with plantarflexion inversion of the foot.  No pain with dorsiflexion eversion of the foot.  No pain at the ATFL ligament at the posterior tibial tendon at the Achilles tendon.   Radiographs: None Assessment:   1. Ingrown nail of third toe of right foot    Plan:  Patient was evaluated and treated and all questions answered.  Ingrown Nail, right -Patient elects to proceed with minor surgery to remove ingrown toenail removal today. Consent reviewed and signed by patient. -Ingrown nail excised. See procedure note. -Educated on post-procedure care including soaking. Written instructions provided and reviewed. -Patient to follow up in 2 weeks for nail check.  Procedure: Excision of Ingrown Toenail Location: Right 1st toe lateral nail borders. Anesthesia: Lidocaine 1% plain; 1.5 mL and Marcaine 0.5% plain; 1.5 mL, digital block. Skin Prep: Betadine. Dressing: Silvadene; telfa; dry, sterile, compression dressing. Technique: Following skin prep, the toe was exsanguinated and a tourniquet was secured at the base of the toe. The affected nail border was freed, split with a nail splitter, and excised. Chemical matrixectomy was then performed with phenol and irrigated out with alcohol. The tourniquet was then removed and sterile dressing applied. Disposition: Patient tolerated procedure well. Patient to return in 2 weeks for follow-up.   No follow-ups on file.  Right tibialis anterior tendinitis versus possible midfoot arthritis -I explained the patient the etiology of the tibialis anterior tendinitis and various treatment options were extensively discussed.  Given the amount of pain that she is having I believe she will benefit from a steroid injection to help decrease acute  inflammatory component associate with pain.  I discussed with her that given that this is near the tendon there is a risk of rupture associated with it.  Patient would like to proceed despite the risks. -If there is no improvement we will discuss cam boot immobilization versus MRI during next clinical visit -A steroid injection was performed at right medial foot using 1% plain Lidocaine and 10 mg of Kenalog. This was well tolerated.   No follow-ups on file.

## 2020-12-28 DIAGNOSIS — E785 Hyperlipidemia, unspecified: Secondary | ICD-10-CM | POA: Diagnosis not present

## 2020-12-28 DIAGNOSIS — E1169 Type 2 diabetes mellitus with other specified complication: Secondary | ICD-10-CM | POA: Diagnosis not present

## 2020-12-28 DIAGNOSIS — J45909 Unspecified asthma, uncomplicated: Secondary | ICD-10-CM | POA: Diagnosis not present

## 2020-12-28 DIAGNOSIS — M1990 Unspecified osteoarthritis, unspecified site: Secondary | ICD-10-CM | POA: Diagnosis not present

## 2020-12-28 DIAGNOSIS — K219 Gastro-esophageal reflux disease without esophagitis: Secondary | ICD-10-CM | POA: Diagnosis not present

## 2020-12-31 MED FILL — IMBRUVICA 420 MG TAB: 420 | 28 days supply | Qty: 28 | Fill #3

## 2021-01-04 ENCOUNTER — Encounter: Payer: Self-pay | Admitting: Oncology

## 2021-01-08 ENCOUNTER — Encounter: Payer: Self-pay | Admitting: *Deleted

## 2021-01-14 ENCOUNTER — Other Ambulatory Visit: Payer: Self-pay

## 2021-01-14 ENCOUNTER — Ambulatory Visit (INDEPENDENT_AMBULATORY_CARE_PROVIDER_SITE_OTHER): Payer: Medicare Other | Admitting: Podiatry

## 2021-01-14 ENCOUNTER — Encounter: Payer: Self-pay | Admitting: Podiatry

## 2021-01-14 DIAGNOSIS — M76811 Anterior tibial syndrome, right leg: Secondary | ICD-10-CM

## 2021-01-14 DIAGNOSIS — L600 Ingrowing nail: Secondary | ICD-10-CM

## 2021-01-14 DIAGNOSIS — M19079 Primary osteoarthritis, unspecified ankle and foot: Secondary | ICD-10-CM | POA: Diagnosis not present

## 2021-01-14 NOTE — Progress Notes (Signed)
Subjective:  Patient ID: Rachel Coffey, female    DOB: 1951/05/05,  MRN: [id number]  Chief Complaint  Patient presents with  . Ingrown Toenail  . Foot Pain    "my toe is just a little sore still, but a lot better and the top of my foot is doing great"    70 y.o. female presents with the above complaint.  Patient presents with a follow-up of right third digit lateral border ingrown as well as right midfoot arthritis.  Patient states she is doing a lot better.  The injection helped considerably taking the nail off also helped a lot.  She has been doing her soaks.  She denies any other acute complaints.  Review of Systems: Negative except as noted in the HPI. Denies N/V/F/Ch.  Past Medical History:  Diagnosis Date  . Asthma    stress induced with allergies  . Diabetes mellitus without complication ([place])   . Genital warts   . Hyperlipidemia   . MS (multiple sclerosis) ([place])   . Neuromuscular disorder ([place])    dx. "43- MS" -Dr. Barkley Boards, [place](Neuropathy and "band around the chest"  . STD (sexually transmitted disease)    genital warts--years ago    Current Outpatient Medications:  .  albuterol (PROVENTIL HFA;VENTOLIN HFA) 108 (90 BASE) MCG/ACT inhaler, Inhale 2 puffs into the lungs every 6 (six) hours as needed for wheezing., Disp: , Rfl:  .  ALPRAZolam (XANAX) 0.5 MG tablet, Take 0.5 mg by mouth daily as needed (for pain). , Disp: , Rfl:  .  cholecalciferol (VITAMIN D) 1000 UNITS tablet, Take 2,000 Units by mouth daily. , Disp: , Rfl:  .  CINNAMON PO, Take 1,000 mg by mouth daily.  (Patient not taking: Reported on 06/23/2020), Disp: , Rfl:  .  cyclobenzaprine (FLEXERIL) 10 MG tablet, Take 10 mg by mouth 3 (three) times daily as needed for muscle spasms., Disp: , Rfl:  .  esomeprazole (NEXIUM) 20 MG capsule, Take 20 mg by mouth daily at 12 noon. , Disp: , Rfl:  .  fluticasone (FLONASE) 50 MCG/ACT nasal spray, Place 1 spray into both nostrils daily. Sometimes bid, Disp: , Rfl:  .   gabapentin (NEURONTIN) 100 MG capsule, Take 100 mg by mouth once., Disp: , Rfl:  .  glipiZIDE (GLUCOTROL XL) 2.5 MG 24 hr tablet, Take 2.5 mg by mouth 2 (two) times daily. , Disp: , Rfl:  .  IMBRUVICA 420 MG TABS, TAKE 1 TABLET (420 MG) BY MOUTH DAILY. ADMINISTER WITH WATER AT APPROXIMATELY THE SAME TIME EACH DAY., Disp: 28 tablet, Rfl: 3 .  Loratadine 10 MG CAPS, Take 10 mg by mouth daily as needed., Disp: , Rfl:  .  losartan (COZAAR) 25 MG tablet, Take 25 mg by mouth daily., Disp: , Rfl:  .  meloxicam (MOBIC) 15 MG tablet, Take 15 mg by mouth daily. Prn, Disp: , Rfl:  .  neomycin-polymyxin b-dexamethasone (MAXITROL) 3.5-10000-0.1 OINT, Place into the left eye 2 (two) times daily., Disp: , Rfl:  .  simvastatin (ZOCOR) 20 MG tablet, Take 20 mg by mouth at bedtime., Disp: , Rfl:  .  trimethoprim-polymyxin b (POLYTRIM) ophthalmic solution, 1-2  drops into affected eye, Disp: , Rfl:   Social History   Tobacco Use  Smoking Status Former Smoker  . Types: Cigarettes  . Quit date: 01/31/2005  . Years since quitting: 15.9  Smokeless Tobacco Never Used    Allergies  Allergen Reactions  . Amoxicillin-Pot Clavulanate Other (See Comments)  . Codeine Itching  .  Fenofibrate     aching joints  . Hydrocodone Itching  . Hydrocodone-Acetaminophen Other (See Comments)  . Lisinopril Other (See Comments)  . Metformin And Related    Objective:  There were no vitals filed for this visit. There is no height or weight on file to calculate BMI. Constitutional Well developed. Well nourished.  Vascular Dorsalis pedis pulses palpable bilaterally. Posterior tibial pulses palpable bilaterally. Capillary refill normal to all digits.  No cyanosis or clubbing noted. Pedal hair growth normal.  Neurologic Normal speech. Oriented to person, place, and time. Epicritic sensation to light touch grossly present bilaterally.  Dermatologic Painful ingrowing nail at lateral nail borders of the hallux nail right. No  open wounds. No skin lesions.  Orthopedic:  Pain on palpation to the insertion of the tibialis anterior tendon.  Pain with dorsiflexion inversion of the foot resisted.  No pain with plantarflexion inversion of the foot.  No pain with dorsiflexion eversion of the foot.  No pain at the ATFL ligament at the posterior tibial tendon at the Achilles tendon.   Radiographs: None Assessment:   1. Ingrown nail of third toe of right foot   2. Anterior tibialis tendinitis of right lower extremity   3. Arthritis of midfoot    Plan:  Patient was evaluated and treated and all questions answered.  Ingrown Nail, right -Clinically healing well.  No acute complaints.   No follow-ups on file.  Right tibialis anterior tendinitis versus possible midfoot arthritis -Clinically healed with a steroid injection.  I discussed with her the importance of shoe gear modification and how to prevent the future recurrence of the inflammation.  Patient states understanding.   No follow-ups on file.

## 2021-01-20 DIAGNOSIS — E1169 Type 2 diabetes mellitus with other specified complication: Secondary | ICD-10-CM | POA: Diagnosis not present

## 2021-01-20 DIAGNOSIS — F419 Anxiety disorder, unspecified: Secondary | ICD-10-CM | POA: Diagnosis not present

## 2021-01-20 DIAGNOSIS — Z6829 Body mass index (BMI) 29.0-29.9, adult: Secondary | ICD-10-CM | POA: Diagnosis not present

## 2021-01-20 DIAGNOSIS — E785 Hyperlipidemia, unspecified: Secondary | ICD-10-CM | POA: Diagnosis not present

## 2021-01-22 ENCOUNTER — Inpatient Hospital Stay (HOSPITAL_BASED_OUTPATIENT_CLINIC_OR_DEPARTMENT_OTHER): Payer: Medicare Other | Admitting: Oncology

## 2021-01-22 ENCOUNTER — Inpatient Hospital Stay: Payer: Medicare Other | Attending: Oncology

## 2021-01-22 ENCOUNTER — Telehealth: Payer: Self-pay | Admitting: Oncology

## 2021-01-22 ENCOUNTER — Other Ambulatory Visit: Payer: Self-pay

## 2021-01-22 VITALS — BP 131/53 | HR 78 | Temp 98.5°F | Resp 18 | Ht 67.0 in | Wt 189.7 lb

## 2021-01-22 DIAGNOSIS — C9110 Chronic lymphocytic leukemia of B-cell type not having achieved remission: Secondary | ICD-10-CM

## 2021-01-22 DIAGNOSIS — G35 Multiple sclerosis: Secondary | ICD-10-CM | POA: Diagnosis not present

## 2021-01-22 DIAGNOSIS — J449 Chronic obstructive pulmonary disease, unspecified: Secondary | ICD-10-CM | POA: Insufficient documentation

## 2021-01-22 DIAGNOSIS — Z298 Encounter for other specified prophylactic measures: Secondary | ICD-10-CM | POA: Diagnosis not present

## 2021-01-22 DIAGNOSIS — E119 Type 2 diabetes mellitus without complications: Secondary | ICD-10-CM | POA: Diagnosis not present

## 2021-01-22 LAB — CBC WITH DIFFERENTIAL (CANCER CENTER ONLY)
Abs Immature Granulocytes: 0.03 10*3/uL (ref 0.00–0.07)
Basophils Absolute: 0.1 10*3/uL (ref 0.0–0.1)
Basophils Relative: 1 %
Eosinophils Absolute: 0.1 10*3/uL (ref 0.0–0.5)
Eosinophils Relative: 1 %
HCT: 42.9 % (ref 36.0–46.0)
Hemoglobin: 14 g/dL (ref 12.0–15.0)
Immature Granulocytes: 0 %
Lymphocytes Relative: 22 %
Lymphs Abs: 1.7 10*3/uL (ref 0.7–4.0)
MCH: 28.7 pg (ref 26.0–34.0)
MCHC: 32.6 g/dL (ref 30.0–36.0)
MCV: 88.1 fL (ref 80.0–100.0)
Monocytes Absolute: 0.6 10*3/uL (ref 0.1–1.0)
Monocytes Relative: 8 %
Neutro Abs: 5 10*3/uL (ref 1.7–7.7)
Neutrophils Relative %: 68 %
Platelet Count: 173 10*3/uL (ref 150–400)
RBC: 4.87 MIL/uL (ref 3.87–5.11)
RDW: 13.2 % (ref 11.5–15.5)
WBC Count: 7.4 10*3/uL (ref 4.0–10.5)
nRBC: 0 % (ref 0.0–0.2)

## 2021-01-22 LAB — CMP (CANCER CENTER ONLY)
ALT: 13 U/L (ref 0–44)
AST: 16 U/L (ref 15–41)
Albumin: 3.8 g/dL (ref 3.5–5.0)
Alkaline Phosphatase: 66 U/L (ref 38–126)
Anion gap: 6 (ref 5–15)
BUN: 16 mg/dL (ref 8–23)
CO2: 29 mmol/L (ref 22–32)
Calcium: 9.2 mg/dL (ref 8.9–10.3)
Chloride: 104 mmol/L (ref 98–111)
Creatinine: 0.8 mg/dL (ref 0.44–1.00)
GFR, Estimated: >60 mL/min (ref >60)
Glucose, Bld: 118 mg/dL — ABNORMAL HIGH (ref 70–99)
Potassium: 4.4 mmol/L (ref 3.5–5.1)
Sodium: 139 mmol/L (ref 135–145)
Total Bilirubin: 0.7 mg/dL (ref 0.3–1.2)
Total Protein: 6.3 g/dL — ABNORMAL LOW (ref 6.5–8.1)

## 2021-01-22 NOTE — Progress Notes (Signed)
Rachel Coffey OFFICE PROGRESS NOTE   Diagnosis: CLL  INTERVAL HISTORY:   Rachel Coffey returns as scheduled.  She continues ibrutinib.  No fever or night sweats.  She feels well.  No bleeding other than easy bruising.  Objective:  Vital signs in last 24 hours:  Blood pressure (!) 131/53, pulse 78, temperature 98.5 F (36.9 C), temperature source Tympanic, resp. rate 18, height 5' 7"  (1.702 m), weight 189 lb 11.2 oz (86 kg), SpO2 97 %.    HEENT: No thrush, ecchymosis at the posterior left buccal mucosa (she reports biting her cheek) Lymphatics: No cervical, supraclavicular, axillary, or inguinal nodes Resp: Lungs clear bilaterally Cardio: Regular rate and rhythm GI: No mass, nontender, no hepatosplenomegaly Vascular: No leg edema  Skin: Small ecchymoses at the dorsum of the forearms    Lab Results:  Lab Results  Component Value Date   WBC 7.4 01/22/2021   HGB 14.0 01/22/2021   HCT 42.9 01/22/2021   MCV 88.1 01/22/2021   PLT 173 01/22/2021   NEUTROABS 5.0 01/22/2021    CMP  Lab Results  Component Value Date   NA 139 01/22/2021   K 4.4 01/22/2021   CL 104 01/22/2021   CO2 29 01/22/2021   GLUCOSE 118 (H) 01/22/2021   BUN 16 01/22/2021   CREATININE 0.80 01/22/2021   CALCIUM 9.2 01/22/2021   PROT 6.3 (L) 01/22/2021   ALBUMIN 3.8 01/22/2021   AST 16 01/22/2021   ALT 13 01/22/2021   ALKPHOS 66 01/22/2021   BILITOT 0.7 01/22/2021   GFRNONAA >60 01/22/2021   GFRAA >60 06/22/2020     Medications: I have reviewed the patient's current medications.   Assessment/Plan: 1. 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 Monoclonal Kappa restricted B-cell population identified onflow cytometry ? Staging PET scan 06/13/2016 confirmed hypermetabolic abdominal/pelvic lymphadenopathy ? FISHpanel positive for 11q-,-12,13q-, and 17p-(10% of cells) ? Peripheral blood flow cytometry 09/22/2016 at  [facility]-CD5/CD23 positive For restricted B-cell population, 2% of lymphocytes ? Peripheral blood IGH mutation analysis [facility] 09/22/2016-unmutated ? CT 06/06/2017-generally stable diffuse lymphadenopathy, node in the pelvis is smaller, some the lymph nodes are larger ? CTs 07/02/2018- slight decrease in size of retrocrural, retroperitoneal, and mesenteric adenopathy, new 6 mm left upper lobe nodule ? CT chest 01/01/2019- increase in size of left upper lobe lung nodule-inflammatory appearance, other stable lung nodules, right retrocrural and upper abdominal adenopathy has progressed ? CTs 07/04/2019- progressive lower thoracic and abdominal adenopathy with dominant mass-effect on the stomach and pancreas, new heterogeneity in the spleen- possible splenic lesion, stable lung nodules ? Ibrutinib starting 08/14/2019 ? CT abdomen/pelvis 11/19/2019-decreased size of dominant abdominal mass and decreased abdominal lymphadenopathy ? CTs 06/22/2020-2 upper abdominal lymph nodes have decreased in size, no evidence of disease progression, unchanged bilateral lung nodules  2. Stage IB (T1b,N0) melanoma of the right forearm, status post a wide excision and sentinel lymph node biopsy at [facility] March 2017   3. Multiple sclerosis  4. Diabetes  5.   COPD   Disposition: Rachel Coffey appears stable.  She is tolerating the ibrutinib well.  There is no evidence for progression of the CLL.  She will continue ibrutinib.  I discussed the Evusheld program with her.  We reviewed potential risk associated with evusheld including the chance of allergic reaction and cardiac toxicity.  She agrees to a referral to consider treatment with Evusheld.  She will return for an office visit in 4 months.  [name], MD  01/22/2021  12:16 PM

## 2021-01-22 NOTE — Telephone Encounter (Signed)
Scheduled per los. Declined printout  

## 2021-01-25 ENCOUNTER — Other Ambulatory Visit: Payer: Self-pay | Admitting: Oncology

## 2021-01-25 ENCOUNTER — Other Ambulatory Visit: Payer: Self-pay | Admitting: Adult Health

## 2021-01-25 ENCOUNTER — Telehealth: Payer: Self-pay | Admitting: Adult Health

## 2021-01-25 ENCOUNTER — Telehealth: Payer: Self-pay | Admitting: Oncology

## 2021-01-25 DIAGNOSIS — C9110 Chronic lymphocytic leukemia of B-cell type not having achieved remission: Secondary | ICD-10-CM

## 2021-01-25 NOTE — Telephone Encounter (Signed)
Scheduled per 03/14 scheduled message, patient has been called and notified of upcoming 03//21 appointment.

## 2021-01-25 NOTE — Progress Notes (Signed)
I connected by phone with Rachel Coffey on 01/25/2021, 2:58 PM to discuss the potential use of a new treatment, tixagevimab/cilgavimab, for pre-exposure prophylaxis for prevention of coronavirus disease 2019 (COVID-19) caused by the SARS-CoV-2 virus.  This patient is a 70 y.o. female that meets the FDA criteria for Emergency Use Authorization of tixagevimab/cilgavimab for pre-exposure prophylaxis of COVID-19 disease. Pt meets following criteria:  Age >12 yr and weight > 40kg  Not currently infected with SARS-CoV-2 and has no known recent exposure to an individual infected with SARS-CoV-2 AND o Who has moderate to severe immune compromise due to a medical condition or receipt of immunosuppressive medications or treatments and may not mount an adequate immune response to COVID-19 vaccination or  o Vaccination with any available COVID-19 vaccine, according to the approved or authorized schedule, is not recommended due to a history of severe adverse reaction (e.g., severe allergic reaction) to a COVID-19 vaccine(s) and/or COVID-19 vaccine component(s).  o Patient meets the following definition of mod-severe immune compromised status: 1. Received B-cell depleting therapies (e.g. rituximab, obinutuzumab, ocrelizumab, alemtuzumab) within last 6 months & age > or = 45  I have spoken and communicated the following to the patient or parent/caregiver regarding COVID monoclonal antibody treatment:  1. FDA has authorized the emergency use of tixagevimab/cilgavimab for the pre-exposure prophylaxis of COVID-19 in patients with moderate-severe immunocompromised status, who meet above EUA criteria.  2. The significant known and potential risks and benefits of COVID monoclonal antibody, and the extent to which such potential risks and benefits are unknown.  3. Information on available alternative treatments and the risks and benefits of those alternatives, including clinical trials.  4. The patient or  parent/caregiver has the option to accept or refuse COVID monoclonal antibody treatment.  After reviewing this information with the patient, agree to receive tixagevimab/cilgavimab.  Schedule message sent, To come in on 3/21 at 330PM.    Scot Dock, NP, 01/25/2021, 2:58 PM

## 2021-01-25 NOTE — Telephone Encounter (Signed)
Called patient to discuss referral received for Evusheld given for pre exposure prophylaxis.  Asked that she call me back to discuss further.   Wilber Bihari, NP

## 2021-01-28 ENCOUNTER — Telehealth: Payer: Self-pay | Admitting: Adult Health

## 2021-01-28 NOTE — Telephone Encounter (Signed)
Received message from patient, and called her back this morning to review common adverse effects.   Discussed with patient in detail.    Wilber Bihari, NP

## 2021-02-01 ENCOUNTER — Inpatient Hospital Stay: Payer: Medicare Other

## 2021-02-01 ENCOUNTER — Other Ambulatory Visit: Payer: Self-pay

## 2021-02-01 DIAGNOSIS — C9110 Chronic lymphocytic leukemia of B-cell type not having achieved remission: Secondary | ICD-10-CM | POA: Diagnosis not present

## 2021-02-01 DIAGNOSIS — G35 Multiple sclerosis: Secondary | ICD-10-CM | POA: Diagnosis not present

## 2021-02-01 DIAGNOSIS — Z298 Encounter for other specified prophylactic measures: Secondary | ICD-10-CM | POA: Diagnosis not present

## 2021-02-01 DIAGNOSIS — E119 Type 2 diabetes mellitus without complications: Secondary | ICD-10-CM | POA: Diagnosis not present

## 2021-02-01 DIAGNOSIS — J449 Chronic obstructive pulmonary disease, unspecified: Secondary | ICD-10-CM | POA: Diagnosis not present

## 2021-02-01 MED ORDER — CILGAVIMAB (PART OF EVUSHELD) INJECTION
300.0000 mg | Freq: Once | INTRAMUSCULAR | Status: AC
  Administered 2021-02-01: 300 mg via INTRAMUSCULAR
  Filled 2021-02-01: qty 3

## 2021-02-01 MED ORDER — TIXAGEVIMAB (PART OF EVUSHELD) INJECTION
300.0000 mg | Freq: Once | INTRAMUSCULAR | Status: AC
  Administered 2021-02-01: 300 mg via INTRAMUSCULAR
  Filled 2021-02-01: qty 3

## 2021-02-01 NOTE — Progress Notes (Signed)
Observed patient for 1 hour post Evusheld injections. Patient verbalized no adverse effects.

## 2021-02-11 ENCOUNTER — Other Ambulatory Visit (HOSPITAL_COMMUNITY): Payer: Self-pay

## 2021-02-16 ENCOUNTER — Other Ambulatory Visit (HOSPITAL_COMMUNITY): Payer: Self-pay

## 2021-02-16 MED FILL — Ibrutinib Tab 420 MG: ORAL | 28 days supply | Qty: 28 | Fill #0 | Status: AC

## 2021-02-17 DIAGNOSIS — G35 Multiple sclerosis: Secondary | ICD-10-CM | POA: Diagnosis not present

## 2021-02-18 ENCOUNTER — Other Ambulatory Visit (HOSPITAL_COMMUNITY): Payer: Self-pay

## 2021-03-04 ENCOUNTER — Encounter: Payer: Self-pay | Admitting: Oncology

## 2021-03-09 ENCOUNTER — Other Ambulatory Visit (HOSPITAL_COMMUNITY): Payer: Self-pay

## 2021-03-09 DIAGNOSIS — L570 Actinic keratosis: Secondary | ICD-10-CM | POA: Diagnosis not present

## 2021-03-09 DIAGNOSIS — G35 Multiple sclerosis: Secondary | ICD-10-CM | POA: Diagnosis not present

## 2021-03-09 DIAGNOSIS — H5203 Hypermetropia, bilateral: Secondary | ICD-10-CM | POA: Diagnosis not present

## 2021-03-09 DIAGNOSIS — H52223 Regular astigmatism, bilateral: Secondary | ICD-10-CM | POA: Diagnosis not present

## 2021-03-09 DIAGNOSIS — H524 Presbyopia: Secondary | ICD-10-CM | POA: Diagnosis not present

## 2021-03-09 DIAGNOSIS — E119 Type 2 diabetes mellitus without complications: Secondary | ICD-10-CM | POA: Diagnosis not present

## 2021-03-09 DIAGNOSIS — H00025 Hordeolum internum left lower eyelid: Secondary | ICD-10-CM | POA: Diagnosis not present

## 2021-03-09 DIAGNOSIS — L578 Other skin changes due to chronic exposure to nonionizing radiation: Secondary | ICD-10-CM | POA: Diagnosis not present

## 2021-03-09 DIAGNOSIS — H2513 Age-related nuclear cataract, bilateral: Secondary | ICD-10-CM | POA: Diagnosis not present

## 2021-03-11 ENCOUNTER — Other Ambulatory Visit (HOSPITAL_COMMUNITY): Payer: Self-pay

## 2021-03-11 MED FILL — Ibrutinib Tab 420 MG: ORAL | 28 days supply | Qty: 28 | Fill #1 | Status: AC

## 2021-03-19 DIAGNOSIS — R059 Cough, unspecified: Secondary | ICD-10-CM | POA: Diagnosis not present

## 2021-03-24 ENCOUNTER — Encounter: Payer: Self-pay | Admitting: Podiatry

## 2021-03-26 ENCOUNTER — Other Ambulatory Visit (HOSPITAL_COMMUNITY): Payer: Self-pay

## 2021-03-26 DIAGNOSIS — J45909 Unspecified asthma, uncomplicated: Secondary | ICD-10-CM | POA: Diagnosis not present

## 2021-03-26 DIAGNOSIS — E785 Hyperlipidemia, unspecified: Secondary | ICD-10-CM | POA: Diagnosis not present

## 2021-03-26 DIAGNOSIS — M1990 Unspecified osteoarthritis, unspecified site: Secondary | ICD-10-CM | POA: Diagnosis not present

## 2021-03-26 DIAGNOSIS — K219 Gastro-esophageal reflux disease without esophagitis: Secondary | ICD-10-CM | POA: Diagnosis not present

## 2021-03-26 DIAGNOSIS — R059 Cough, unspecified: Secondary | ICD-10-CM | POA: Diagnosis not present

## 2021-03-26 DIAGNOSIS — E1169 Type 2 diabetes mellitus with other specified complication: Secondary | ICD-10-CM | POA: Diagnosis not present

## 2021-03-30 DIAGNOSIS — Z23 Encounter for immunization: Secondary | ICD-10-CM | POA: Diagnosis not present

## 2021-03-31 ENCOUNTER — Other Ambulatory Visit (HOSPITAL_COMMUNITY): Payer: Self-pay

## 2021-04-01 ENCOUNTER — Encounter: Payer: Self-pay | Admitting: Podiatry

## 2021-04-01 ENCOUNTER — Encounter: Payer: Self-pay | Admitting: Oncology

## 2021-04-01 ENCOUNTER — Ambulatory Visit (INDEPENDENT_AMBULATORY_CARE_PROVIDER_SITE_OTHER): Payer: Medicare Other | Admitting: Podiatry

## 2021-04-01 ENCOUNTER — Other Ambulatory Visit: Payer: Self-pay

## 2021-04-01 ENCOUNTER — Other Ambulatory Visit (HOSPITAL_COMMUNITY): Payer: Self-pay

## 2021-04-01 DIAGNOSIS — Z85820 Personal history of malignant melanoma of skin: Secondary | ICD-10-CM | POA: Diagnosis not present

## 2021-04-01 DIAGNOSIS — L570 Actinic keratosis: Secondary | ICD-10-CM | POA: Diagnosis not present

## 2021-04-01 DIAGNOSIS — Z85828 Personal history of other malignant neoplasm of skin: Secondary | ICD-10-CM | POA: Diagnosis not present

## 2021-04-01 DIAGNOSIS — D225 Melanocytic nevi of trunk: Secondary | ICD-10-CM | POA: Diagnosis not present

## 2021-04-01 DIAGNOSIS — Z808 Family history of malignant neoplasm of other organs or systems: Secondary | ICD-10-CM | POA: Diagnosis not present

## 2021-04-01 DIAGNOSIS — L578 Other skin changes due to chronic exposure to nonionizing radiation: Secondary | ICD-10-CM | POA: Diagnosis not present

## 2021-04-01 DIAGNOSIS — L723 Sebaceous cyst: Secondary | ICD-10-CM | POA: Diagnosis not present

## 2021-04-01 DIAGNOSIS — M19079 Primary osteoarthritis, unspecified ankle and foot: Secondary | ICD-10-CM | POA: Diagnosis not present

## 2021-04-01 DIAGNOSIS — L821 Other seborrheic keratosis: Secondary | ICD-10-CM | POA: Diagnosis not present

## 2021-04-01 DIAGNOSIS — M76811 Anterior tibial syndrome, right leg: Secondary | ICD-10-CM | POA: Diagnosis not present

## 2021-04-01 MED FILL — Ibrutinib Tab 420 MG: ORAL | 28 days supply | Qty: 28 | Fill #2 | Status: AC

## 2021-04-07 ENCOUNTER — Encounter: Payer: Self-pay | Admitting: Podiatry

## 2021-04-07 NOTE — Progress Notes (Signed)
Subjective:  Patient ID: Rachel Coffey, female    DOB: 1951/08/27,  MRN: [id number]  Chief Complaint  Patient presents with  . Foot Pain    "It's sore.  He gave me a shot of cortisone and that seemed to have done it but I'm out trying to walk again.  Now it's giving me a fit.  The pain shoots up my toe."    70 y.o. female presents with the above complaint.  Patient presents for follow-up of right tibialis anterior tendinitis/midfoot arthritis.  Patient states that is much better now.  The injection does help a lot.  She would like to know if she can do 1 more shot for some residual pain.  She denies any other acute complaints.   Review of Systems: Negative except as noted in the HPI. Denies N/V/F/Ch.  Past Medical History:  Diagnosis Date  . Asthma    stress induced with allergies  . Diabetes mellitus without complication ([place])   . Genital warts   . Hyperlipidemia   . MS (multiple sclerosis) ([place])   . Neuromuscular disorder ([place])    dx. "8- MS" -Dr. Barkley Boards, [place](Neuropathy and "band around the chest"  . STD (sexually transmitted disease)    genital warts--years ago    Current Outpatient Medications:  .  albuterol (PROVENTIL HFA;VENTOLIN HFA) 108 (90 BASE) MCG/ACT inhaler, Inhale 2 puffs into the lungs every 6 (six) hours as needed for wheezing., Disp: , Rfl:  .  ALPRAZolam (XANAX) 0.5 MG tablet, Take 0.5 mg by mouth daily as needed (for pain). , Disp: , Rfl:  .  cholecalciferol (VITAMIN D) 1000 UNITS tablet, Take 2,000 Units by mouth daily. , Disp: , Rfl:  .  cyclobenzaprine (FLEXERIL) 10 MG tablet, Take 10 mg by mouth 3 (three) times daily as needed for muscle spasms., Disp: , Rfl:  .  esomeprazole (NEXIUM) 20 MG capsule, Take 20 mg by mouth daily at 12 noon. , Disp: , Rfl:  .  fluticasone (FLONASE) 50 MCG/ACT nasal spray, Place 1 spray into both nostrils daily. Sometimes bid, Disp: , Rfl:  .  gabapentin (NEURONTIN) 100 MG capsule, Take 100 mg by mouth once., Disp: ,  Rfl:  .  glipiZIDE (GLUCOTROL XL) 2.5 MG 24 hr tablet, Take 2.5 mg by mouth 2 (two) times daily. , Disp: , Rfl:  .  ibrutinib 420 MG TABS, TAKE 1 TABLET (420 MG) BY MOUTH DAILY. ADMINISTER WITH WATER AT APPROXIMATELY THE SAME TIME EACH DAY., Disp: 28 tablet, Rfl: 3 .  Loratadine 10 MG CAPS, Take 10 mg by mouth daily as needed., Disp: , Rfl:  .  losartan (COZAAR) 25 MG tablet, Take 25 mg by mouth daily., Disp: , Rfl:  .  meloxicam (MOBIC) 15 MG tablet, Take 15 mg by mouth daily. Prn, Disp: , Rfl:  .  simvastatin (ZOCOR) 20 MG tablet, Take 20 mg by mouth at bedtime., Disp: , Rfl:   Social History   Tobacco Use  Smoking Status Former Smoker  . Types: Cigarettes  . Quit date: 01/31/2005  . Years since quitting: 16.1  Smokeless Tobacco Never Used    Allergies  Allergen Reactions  . Amoxicillin-Pot Clavulanate Other (See Comments)  . Codeine Itching  . Fenofibrate     aching joints  . Hydrocodone Itching  . Hydrocodone-Acetaminophen Other (See Comments)  . Lisinopril Other (See Comments)  . Metformin And Related   . Other Other (See Comments)   Objective:  There were no vitals filed for this visit.  There is no height or weight on file to calculate BMI. Constitutional Well developed. Well nourished.  Vascular Dorsalis pedis pulses palpable bilaterally. Posterior tibial pulses palpable bilaterally. Capillary refill normal to all digits.  No cyanosis or clubbing noted. Pedal hair growth normal.  Neurologic Normal speech. Oriented to person, place, and time. Epicritic sensation to light touch grossly present bilaterally.  Dermatologic Painful ingrowing nail at lateral nail borders of the hallux nail right. No open wounds. No skin lesions.  Orthopedic:  Pain on palpation to the insertion of the tibialis anterior tendon.  Pain with dorsiflexion inversion of the foot resisted.  No pain with plantarflexion inversion of the foot.  No pain with dorsiflexion eversion of the foot.  No pain  at the ATFL ligament at the posterior tibial tendon at the Achilles tendon.   Radiographs: None Assessment:   1. Anterior tibialis tendinitis of right lower extremity   2. Arthritis of midfoot    Plan:  Patient was evaluated and treated and all questions answered.  Ingrown Nail, right -Clinically healed  No follow-ups on file.  Right tibialis anterior tendinitis versus possible midfoot arthritis -I explained the patient the etiology of the tibialis anterior tendinitis and various treatment options were extensively discussed.  Given the amount of pain that she is having I believe she will benefit from a steroid injection to help decrease acute inflammatory component associate with pain.  I discussed with her that given that this is near the tendon there is a risk of rupture associated with it.  Patient would like to proceed despite the risks. -If there is no improvement we will discuss cam boot immobilization versus MRI during next clinical visit -A second steroid injection was performed at right medial foot using 1% plain Lidocaine and 10 mg of Kenalog. This was well tolerated.   No follow-ups on file.

## 2021-04-08 ENCOUNTER — Other Ambulatory Visit (HOSPITAL_COMMUNITY): Payer: Self-pay

## 2021-04-09 ENCOUNTER — Telehealth: Payer: Self-pay

## 2021-04-09 DIAGNOSIS — Z6829 Body mass index (BMI) 29.0-29.9, adult: Secondary | ICD-10-CM | POA: Diagnosis not present

## 2021-04-09 DIAGNOSIS — S20222A Contusion of left back wall of thorax, initial encounter: Secondary | ICD-10-CM | POA: Diagnosis not present

## 2021-04-09 NOTE — Telephone Encounter (Signed)
TC from Pt stating she had an accident while out where she fell on her back. Pt stated she has been having muscle spasms for the last three weeks and since she fell the spasms are still there. She stated she went to see her PCP and was given meloxicam she stated that Dr Benay Spice had informed her that she should not take meloxicam on the regular. Relayed what the Pt told me to Dr. Benay Spice per Dr Benay Spice it ok for Pt to take the meloxicam for the next 4 day. Pt verbalized understanding. No further problems or concerns noted.

## 2021-04-15 ENCOUNTER — Ambulatory Visit: Payer: Medicare Other | Admitting: Podiatry

## 2021-04-22 ENCOUNTER — Other Ambulatory Visit (HOSPITAL_COMMUNITY): Payer: Self-pay

## 2021-04-26 ENCOUNTER — Other Ambulatory Visit (HOSPITAL_COMMUNITY): Payer: Self-pay

## 2021-04-27 ENCOUNTER — Encounter: Payer: Self-pay | Admitting: Oncology

## 2021-04-27 NOTE — Telephone Encounter (Signed)
Return t/c to Pt to inquire about the discomfort in her chest Pt denies sob and numbness tingling. Pt. States she thinks it could be a MS hug but wants to be sure. Pt had a fall 3 weeks ago and had some back spasms and was put on robaxin asked Pt If a x-ray was done when she fell she stated "No" informed her to call her PCP to take an xray just to be on the safe side. Pt stated she is also having bowel movements multiple times a day informed Pt that side effects from the medication she is on can cause diarrhea. And inquired if she took imodiumPt states she is worried and hopes this has nothing to do with her CLL informed her to give me a call tomorrow. Discussed with Dr. Seward Speck results show T12 compression fracture informed Pt to follow up with PCP.

## 2021-04-28 ENCOUNTER — Other Ambulatory Visit: Payer: Self-pay | Admitting: Family Medicine

## 2021-04-28 ENCOUNTER — Encounter: Payer: Self-pay | Admitting: Oncology

## 2021-04-28 ENCOUNTER — Ambulatory Visit
Admission: RE | Admit: 2021-04-28 | Discharge: 2021-04-28 | Disposition: A | Discharge status: Home / Self Care | Payer: Medicare Other | Source: Ambulatory Visit | Attending: Family Medicine | Admitting: Family Medicine

## 2021-04-28 DIAGNOSIS — M545 Low back pain, unspecified: Secondary | ICD-10-CM | POA: Diagnosis not present

## 2021-04-28 DIAGNOSIS — M47814 Spondylosis without myelopathy or radiculopathy, thoracic region: Secondary | ICD-10-CM | POA: Diagnosis not present

## 2021-04-28 DIAGNOSIS — M549 Dorsalgia, unspecified: Secondary | ICD-10-CM

## 2021-04-29 ENCOUNTER — Telehealth: Payer: Self-pay

## 2021-04-29 NOTE — Telephone Encounter (Signed)
-----   Message from Ladell Pier, MD sent at 04/29/2021  3:45 PM EDT ----- Probably a benign compression fracture related to the fall, unlikely this has anything to do with the CLL, she should follow-up with her primary provider, can be referred to orthopedics if the pain does not improve ----- Message ----- From: Kelli Hope, LPN Sent: [id number]   3:25 PM EDT To: Ladell Pier, MD  Hi Dr  Benay Spice, above Pt has been messaging about pain to her back. If you recall she had a fall 3 weeks ago and she called about using robaxin. You told her it was ok to use robaxin. Now she returned the call to say she still has back pain I  informed her to ask her PCP to get an xray. X-ray results show T12 compression fracture. Pt wants to know what are your thoughts and thinks something is going on with her CLL versus what the results show on the x-ray. Please advise

## 2021-04-29 NOTE — Telephone Encounter (Signed)
This advice was already discussed with the Pt.

## 2021-04-30 ENCOUNTER — Telehealth (HOSPITAL_COMMUNITY): Payer: Self-pay | Admitting: Radiology

## 2021-04-30 NOTE — Telephone Encounter (Signed)
Called referring dr's office. Left message for Rosemary Holms that per [facility] patient needs an MRI for back (thoracic).JM

## 2021-05-03 ENCOUNTER — Other Ambulatory Visit: Payer: Self-pay | Admitting: Family Medicine

## 2021-05-03 ENCOUNTER — Ambulatory Visit (HOSPITAL_COMMUNITY)
Admission: RE | Admit: 2021-05-03 | Discharge: 2021-05-03 | Disposition: A | Discharge status: Home / Self Care | Payer: Medicare Other | Source: Ambulatory Visit | Attending: Family Medicine | Admitting: Family Medicine

## 2021-05-03 ENCOUNTER — Other Ambulatory Visit (HOSPITAL_COMMUNITY): Payer: Self-pay | Admitting: Family Medicine

## 2021-05-03 DIAGNOSIS — M4804 Spinal stenosis, thoracic region: Secondary | ICD-10-CM | POA: Diagnosis not present

## 2021-05-03 DIAGNOSIS — M546 Pain in thoracic spine: Secondary | ICD-10-CM | POA: Diagnosis not present

## 2021-05-03 DIAGNOSIS — S22089A Unspecified fracture of T11-T12 vertebra, initial encounter for closed fracture: Secondary | ICD-10-CM | POA: Diagnosis not present

## 2021-05-03 DIAGNOSIS — S22080A Wedge compression fracture of T11-T12 vertebra, initial encounter for closed fracture: Secondary | ICD-10-CM | POA: Diagnosis not present

## 2021-05-03 DIAGNOSIS — M5124 Other intervertebral disc displacement, thoracic region: Secondary | ICD-10-CM | POA: Diagnosis not present

## 2021-05-05 ENCOUNTER — Other Ambulatory Visit (HOSPITAL_COMMUNITY): Payer: Self-pay

## 2021-05-05 ENCOUNTER — Encounter: Payer: Self-pay | Admitting: Oncology

## 2021-05-05 ENCOUNTER — Other Ambulatory Visit (HOSPITAL_COMMUNITY): Payer: Self-pay | Admitting: Interventional Radiology

## 2021-05-05 ENCOUNTER — Other Ambulatory Visit: Payer: Self-pay | Admitting: Oncology

## 2021-05-05 ENCOUNTER — Telehealth (HOSPITAL_COMMUNITY): Payer: Self-pay

## 2021-05-05 DIAGNOSIS — S22080A Wedge compression fracture of T11-T12 vertebra, initial encounter for closed fracture: Secondary | ICD-10-CM

## 2021-05-05 DIAGNOSIS — C9110 Chronic lymphocytic leukemia of B-cell type not having achieved remission: Secondary | ICD-10-CM

## 2021-05-05 NOTE — Telephone Encounter (Signed)
Ok per [employer] for T12  possibly T11 KP. AW

## 2021-05-06 ENCOUNTER — Telehealth (HOSPITAL_COMMUNITY): Payer: Self-pay

## 2021-05-06 ENCOUNTER — Other Ambulatory Visit: Payer: Self-pay | Admitting: Radiology

## 2021-05-06 NOTE — H&P (Signed)
Chief Complaint: Patient was seen in consultation today for T12/possible T11 KP/VP.  Referring Physician(s): Kathyrn Lass  Supervising Physician: Luanne Bras  Patient Status: [facility] - Out-pt  History of Present Illness: Rachel Coffey is a 70 y.o. female with a past medical history significant for MS, DM, HLD, melanoma and CLL who presents today for T12 KP/VP with bone biopsy and moderate sedation. Rachel Coffey had a fall about 4 weeks ago and has had persistent back pain since that time. She was tried on Robaxin which gave her a rash so she is now taking baclofen and Tylenol. She cannot take NSAIDs due to CLL. She was sent for an x-ray of the thoracic and lumbar spine on 6/15 which showed an age indeterminate mild compression deformity at T12. Follow up MRI on 6/20 showed a recent T12 superior endplate fracture with approximately 30% height loss and involvement of bilateral pedicles, approximately 3 mm of bony retropulsion without significant canal stenosis. She has been referred to [facility] for a T12/possible T11 KP/VP for symptom management.  Rachel Coffey states she fell about 4 weeks ago and was given medication which has helped some but her pain is still persistent and causes her to have trouble with daily tasks. She has nerve pain in her back from an MS lesion that is around the same area as the compression fracture, she understands that this procedure will not fix the nerve pain. She has been feeling well otherwise and is looking forward to a cup of coffee after we are done. She understands the procedure today and is agreeable to proceed as planned.  Past Medical History:  Diagnosis Date   Asthma    stress induced with allergies   Diabetes mellitus without complication ([place])    Genital warts    Hyperlipidemia    MS (multiple sclerosis) (HCC)    Neuromuscular disorder (HCC)    dx. "52- MS" -Dr. Barkley Boards, [place](Neuropathy and "band around the chest"   STD (sexually transmitted  disease)    genital warts--years ago    Past Surgical History:  Procedure Laterality Date   ABDOMINAL HYSTERECTOMY     with 1 ovary removed-[facility]H   CHOLECYSTECTOMY N/A 02/25/2013   Procedure: LAPAROSCOPIC CHOLECYSTECTOMY WITH INTRAOPERATIVE CHOLANGIOGRAM;  Surgeon: Earnstine Regal, MD;  Location: [facility] ORS;  Service: General;  Laterality: N/A;   COLONOSCOPY  02-19-13   1 polyp removal in past   TONSILLECTOMY      Allergies: Amoxicillin-pot clavulanate, Codeine, Fenofibrate, Hydrocodone, Hydrocodone-acetaminophen, Lisinopril, and Metformin and related  Medications: Prior to Admission medications   Medication Sig Start Date End Date Taking? Authorizing Provider  acetaminophen (TYLENOL) 500 MG tablet Take 500 mg by mouth in the morning and at bedtime.   Yes [provider]  albuterol (PROVENTIL HFA;VENTOLIN HFA) 108 (90 BASE) MCG/ACT inhaler Inhale 2 puffs into the lungs every 6 (six) hours as needed for wheezing.   Yes [provider]  ALPRAZolam Duanne Moron) 0.5 MG tablet Take 0.5 mg by mouth daily as needed (for pain).    Yes [provider]  baclofen (LIORESAL) 10 MG tablet Take 5 mg by mouth 3 (three) times daily as needed. 04/15/21  Yes [provider]  calcitonin, salmon, (MIACALCIN/FORTICAL) 200 UNIT/ACT nasal spray Place 1 spray into alternate nostrils daily. 04/30/21  Yes [provider]  Cholecalciferol (VITAMIN D) 50 MCG (2000 UT) CAPS Take 2,000 Units by mouth daily.    Yes [provider]  diclofenac Sodium (VOLTAREN) 1 % GEL Apply 1  application topically 4 (four) times daily as needed (pain).   Yes [provider]  esomeprazole (NEXIUM) 20 MG capsule Take 20 mg by mouth daily.   Yes [provider]  fluticasone (FLONASE) 50 MCG/ACT nasal spray Place 1 spray into both nostrils daily as needed for allergies. Sometimes bid   Yes [provider]  gabapentin (NEURONTIN) 100 MG capsule Take 100 mg by mouth at bedtime.  05/27/20  Yes [provider]  glipiZIDE (GLUCOTROL XL) 2.5 MG 24 hr tablet Take 2.5 mg by mouth 2 (two) times daily.    Yes [provider]  ibrutinib 420 MG TABS TAKE 1 TABLET (420 MG) BY MOUTH DAILY. ADMINISTER WITH WATER AT APPROXIMATELY THE SAME TIME EACH DAY. Patient taking differently: Take 420 mg by mouth daily. 01/25/21 01/25/22 Yes Ladell Pier, MD  loratadine (CLARITIN) 10 MG tablet Take 10 mg by mouth daily as needed (allergies).   Yes [provider]  losartan (COZAAR) 25 MG tablet Take 25 mg by mouth daily. 12/24/18  Yes [provider]  meloxicam (MOBIC) 15 MG tablet Take 15 mg by mouth daily as needed for pain.   Yes [provider]  OVER THE COUNTER MEDICATION Apply 1 application topically daily as needed (pain). Level Select CBD Sport cream   Yes [provider]  simvastatin (ZOCOR) 20 MG tablet Take 20 mg by mouth at bedtime.   Yes [provider]     Family History  Problem Relation Age of Onset   COPD Mother    Heart disease Mother    Diabetes Father    Heart disease Sister     Social History   Socioeconomic History   Marital status: Married    Spouse name: Not on file   Number of children: Not on file   Years of education: Not on file   Highest education level: Not on file  Occupational History   Not on file  Tobacco Use   Smoking status: Former    Pack years: 0.00    Types: Cigarettes    Quit date: 01/31/2005    Years since quitting: 16.2   Smokeless tobacco: Never  Substance and Sexual Activity   Alcohol use: No   Drug use: No   Sexual activity: Never    Partners: Male    Comment: TVH--still has 1 ovary  Other Topics Concern   Not on file  Social History Narrative   Not on file   Social Determinants of Health   Financial Resource Strain: Not on file  Food Insecurity: Not on file  Transportation Needs: Not on file  Physical Activity: Not on file  Stress: Not on file  Social  Connections: Not on file     Review of Systems: A 12 point ROS discussed and pertinent positives are indicated in the HPI above.  All other systems are negative.  Review of Systems  Constitutional:  Negative for appetite change, chills and fever.  Respiratory:  Negative for cough and shortness of breath.   Cardiovascular:  Negative for chest pain.  Gastrointestinal:  Negative for abdominal pain, diarrhea, nausea and vomiting.  Musculoskeletal:  Positive for back pain. Negative for neck pain.  Skin:  Negative for rash and wound.  Neurological:  Negative for dizziness, weakness and headaches.   Vital Signs: BP 120/64   Pulse 78   Temp 98.1 F (36.7 C) (Oral)   Resp 16   Ht 5\' 7"  (1.702 m)   Wt 183 lb (83  kg)   SpO2 95%   BMI 28.66 kg/m   Physical Exam Vitals reviewed.  Constitutional:      General: She is not in acute distress. HENT:     Head: Normocephalic.     Mouth/Throat:     Mouth: Mucous membranes are moist.     Pharynx: Oropharynx is clear. No oropharyngeal exudate or posterior oropharyngeal erythema.  Cardiovascular:     Rate and Rhythm: Normal rate and regular rhythm.  Pulmonary:     Effort: Pulmonary effort is normal.     Breath sounds: Normal breath sounds.  Abdominal:     General: There is no distension.     Palpations: Abdomen is soft.     Tenderness: There is no abdominal tenderness.  Musculoskeletal:     Comments: (+) point tenderness at mid back near T12/L1  Skin:    General: Skin is warm and dry.  Neurological:     Mental Status: She is alert and oriented to person, place, and time.     Cranial Nerves: No cranial nerve deficit.  Psychiatric:        Mood and Affect: Mood normal.        Behavior: Behavior normal.        Thought Content: Thought content normal.        Judgment: Judgment normal.     MD Evaluation Airway: WNL Heart: WNL Abdomen: WNL Chest/ Lungs: WNL ASA  Classification: 2 Mallampati/Airway Score: Two   Imaging: DG  Thoracic Spine W/Swimmers  Result Date: 04/28/2021 CLINICAL DATA:  Back pain EXAM: THORACIC SPINE - 3 VIEWS COMPARISON:  CT 07/22/2013, 06/22/2020 FINDINGS: Mild levocurvature of the spine. Sagittal alignment within normal limits. Vertebral body heights are maintained. Moderate degenerative changes involving the mid to lower thoracic spine. There is mild compression deformity of the R44 vertebra of uncertain age. IMPRESSION: Degenerative changes. Age indeterminate mild compression deformity at T12. Electronically Signed   By: Donavan Foil M.D.   On: 04/28/2021 17:37   DG Lumbar Spine 2-3 Views  Result Date: 04/28/2021 CLINICAL DATA:  Back pain history of fall EXAM: LUMBAR SPINE - 2-3 VIEW COMPARISON:  CT 06/22/2020 FINDINGS: Lumbar alignment within normal limits. Mild superior endplate deformity at R15 with fracture involving the anterior superior vertebral body. Slight sclerosis suggests subacute fracture. About 25% loss of vertebral body height anteriorly. Remaining vertebral bodies are normal. Facet degenerative changes of the lower lumbar spine. IMPRESSION: Subacute appearing mild compression deformity of the T12 vertebra. Electronically Signed   By: Donavan Foil M.D.   On: 04/28/2021 17:42   MR THORACIC SPINE WO CONTRAST  Result Date: 05/04/2021 CLINICAL DATA:  Back pain. EXAM: MRI THORACIC SPINE WITHOUT CONTRAST TECHNIQUE: Multiplanar, multisequence MR imaging of the thoracic spine was performed. No intravenous contrast was administered. COMPARISON:  Thoracic radiographs April 28, 2021. FINDINGS: Alignment:  No substantial sagittal subluxation. Vertebrae: Approximately 30% height loss of the T12 vertebral body with associated linear marrow edema of the superior endplate, compatible with recent/unhealed superior endplate fracture. Marrow edema extends to involve bilateral T12 pedicles. Additionally, there is slight edema in the inferior and posterolateral left T11 vertebral body (series 17, image 12,  compatible with an additional fracture. No specific evidence of discitis/osteomyelitis. Mildly heterogeneous bone marrow without suspicious bone lesion. Cord:  Normal cord signal. Paraspinal and other soft tissues: Suboptimally evaluated suspected hepatic cyst. Disc levels: Small posterior disc protrusions at multiple levels, largest at T8-T9 and T9-T10. These disc protrusions partially efface ventral CSF without significant  canal stenosis. Approximately 3 mm of bony retropulsion along the T12 superior endplate fracture which partially effaces ventral CSF without significant canal stenosis. Multilevel facet hypertrophy with moderate left foraminal stenosis at T11-T12. Otherwise, multilevel mild foraminal stenosis. IMPRESSION: 1. Recent/unhealed T12 superior endplate fracture with approximately 30% height loss and involvement of bilateral pedicles. Approximately 3 mm of bony retropulsion without significant canal stenosis. 2. Slight edema in the inferior and posterolateral left T11 vertebral body, compatible with an additional fracture. Moderate left foraminal stenosis at T11-T12 may be degenerative and/or traumatic. 3. Multilevel small disc protrusions without significant canal stenosis. These results will be called to the ordering clinician or representative by the Radiologist Assistant, and communication documented in the PACS or [employer]. Electronically Signed   By: Margaretha Sheffield MD   On: 05/04/2021 18:13    Labs:  CBC: Recent Labs    06/22/20 0939 09/24/20 1448 01/22/21 1105  WBC 6.9 9.6 7.4  HGB 14.1 14.1 14.0  HCT 44.7 43.5 42.9  PLT 162 164 173    COAGS: No results for input(s): INR, APTT in the last 8760 hours.  BMP: Recent Labs    06/22/20 0939 09/24/20 1448 01/22/21 1105  NA 139 142 139  K 4.1 4.7 4.4  CL 102 104 104  CO2 29 33* 29  GLUCOSE 107* 113* 118*  BUN 15 17 16   CALCIUM 10.3 9.6 9.2  CREATININE 0.80 0.95 0.80  GFRNONAA >60 >60 >60  GFRAA >60  --   --      LIVER FUNCTION TESTS: Recent Labs    06/22/20 0939 09/24/20 1448 01/22/21 1105  BILITOT 0.7 0.7 0.7  AST 15 17 16   ALT 13 11 13   ALKPHOS 67 77 66  PROT 6.6 6.5 6.3*  ALBUMIN 3.9 3.9 3.8    TUMOR MARKERS: No results for input(s): AFPTM, CEA, CA199, CHROMGRNA in the last 8760 hours.  Assessment and Plan:  70 y/o F with recent fall resulting symptomatic compression fracture not resolved with conservative measures who  presents today for T12/possible T11 KP/VP.  Risks and benefits of T12/possible T11 kyphoplasty/vertebroplasty were discussed with the patient including, but not limited to education regarding the natural healing process of compression fractures without intervention, bleeding, infection, cement migration which may cause spinal cord damage, paralysis, pulmonary embolism or even death.  This interventional procedure involves the use of X-rays and because of the nature of the planned procedure, it is possible that we will have prolonged use of X-ray fluoroscopy. Potential radiation risks to you include (but are not limited to) the following: - A slightly elevated risk for cancer  several years later in life. This risk is typically less than 0.5% percent. This risk is low in comparison to the normal incidence of human cancer, which is 33% for women and 50% for men according to the [facility]. - Radiation induced injury can include skin redness, resembling a rash, tissue breakdown / ulcers and hair loss (which can be temporary or permanent).  The likelihood of either of these occurring depends on the difficulty of the procedure and whether you are sensitive to radiation due to previous procedures, disease, or genetic conditions.  IF your procedure requires a prolonged use of radiation, you will be notified and given written instructions for further action.  It is your responsibility to monitor the irradiated area for the 2 weeks following the procedure and to  notify your physician if you are concerned that you have suffered a radiation induced injury.  All of the patient's questions were answered, patient is agreeable to proceed.  Consent signed and in chart.  Thank you for this interesting consult.  I greatly enjoyed meeting Rachel Coffey and look forward to participating in their care.  A copy of this report was sent to the requesting provider on this date.  Electronically Signed: Joaquim Nam, PA-C 05/06/2021, 2:21 PM   I spent a total of 30 Minutes  in face to face in clinical consultation, greater than 50% of which was counseling/coordinating care for T12/possible T11 KP/VP.

## 2021-05-06 NOTE — Telephone Encounter (Signed)
Called the office and left message for Pamala Hurry to find out if Dr. Sabra Heck would like a biopsy when the patient has her kyphoplasty tomorrow. AW

## 2021-05-07 ENCOUNTER — Other Ambulatory Visit (HOSPITAL_COMMUNITY): Payer: Self-pay

## 2021-05-07 ENCOUNTER — Other Ambulatory Visit: Payer: Self-pay

## 2021-05-07 ENCOUNTER — Ambulatory Visit (HOSPITAL_COMMUNITY)
Admission: RE | Admit: 2021-05-07 | Discharge: 2021-05-07 | Disposition: A | Discharge status: Home / Self Care | Payer: Medicare Other | Source: Ambulatory Visit | Attending: Interventional Radiology | Admitting: Interventional Radiology

## 2021-05-07 ENCOUNTER — Encounter (HOSPITAL_COMMUNITY): Payer: Self-pay

## 2021-05-07 DIAGNOSIS — Z888 Allergy status to other drugs, medicaments and biological substances status: Secondary | ICD-10-CM | POA: Insufficient documentation

## 2021-05-07 DIAGNOSIS — Z87891 Personal history of nicotine dependence: Secondary | ICD-10-CM | POA: Insufficient documentation

## 2021-05-07 DIAGNOSIS — G35 Multiple sclerosis: Secondary | ICD-10-CM | POA: Insufficient documentation

## 2021-05-07 DIAGNOSIS — S22080A Wedge compression fracture of T11-T12 vertebra, initial encounter for closed fracture: Secondary | ICD-10-CM

## 2021-05-07 DIAGNOSIS — Z833 Family history of diabetes mellitus: Secondary | ICD-10-CM | POA: Diagnosis not present

## 2021-05-07 DIAGNOSIS — Z885 Allergy status to narcotic agent status: Secondary | ICD-10-CM | POA: Diagnosis not present

## 2021-05-07 DIAGNOSIS — Z7984 Long term (current) use of oral hypoglycemic drugs: Secondary | ICD-10-CM | POA: Insufficient documentation

## 2021-05-07 DIAGNOSIS — W19XXXA Unspecified fall, initial encounter: Secondary | ICD-10-CM | POA: Insufficient documentation

## 2021-05-07 DIAGNOSIS — E785 Hyperlipidemia, unspecified: Secondary | ICD-10-CM | POA: Insufficient documentation

## 2021-05-07 DIAGNOSIS — E119 Type 2 diabetes mellitus without complications: Secondary | ICD-10-CM | POA: Diagnosis not present

## 2021-05-07 DIAGNOSIS — Z79899 Other long term (current) drug therapy: Secondary | ICD-10-CM | POA: Insufficient documentation

## 2021-05-07 DIAGNOSIS — M4854XA Collapsed vertebra, not elsewhere classified, thoracic region, initial encounter for fracture: Secondary | ICD-10-CM | POA: Insufficient documentation

## 2021-05-07 DIAGNOSIS — Z880 Allergy status to penicillin: Secondary | ICD-10-CM | POA: Diagnosis not present

## 2021-05-07 HISTORY — PX: IR KYPHO THORACIC WITH BONE BIOPSY: IMG5518

## 2021-05-07 LAB — BASIC METABOLIC PANEL
Anion gap: 7 (ref 5–15)
BUN: 16 mg/dL (ref 8–23)
CO2: 27 mmol/L (ref 22–32)
Calcium: 9.2 mg/dL (ref 8.9–10.3)
Chloride: 104 mmol/L (ref 98–111)
Creatinine, Ser: 0.71 mg/dL (ref 0.44–1.00)
GFR, Estimated: >60 mL/min (ref >60)
Glucose, Bld: 156 mg/dL — ABNORMAL HIGH (ref 70–99)
Potassium: 3.9 mmol/L (ref 3.5–5.1)
Sodium: 138 mmol/L (ref 135–145)

## 2021-05-07 LAB — CBC
HCT: 46.5 % — ABNORMAL HIGH (ref 36.0–46.0)
Hemoglobin: 14.8 g/dL (ref 12.0–15.0)
MCH: 28.6 pg (ref 26.0–34.0)
MCHC: 31.8 g/dL (ref 30.0–36.0)
MCV: 89.9 fL (ref 80.0–100.0)
Platelets: 170 10*3/uL (ref 150–400)
RBC: 5.17 MIL/uL — ABNORMAL HIGH (ref 3.87–5.11)
RDW: 13.1 % (ref 11.5–15.5)
WBC: 8.3 10*3/uL (ref 4.0–10.5)
nRBC: 0 % (ref 0.0–0.2)

## 2021-05-07 LAB — PROTIME-INR
INR: 0.9 (ref 0.8–1.2)
Prothrombin Time: 12.1 seconds (ref 11.4–15.2)

## 2021-05-07 LAB — GLUCOSE, CAPILLARY: Glucose-Capillary: 156 mg/dL — ABNORMAL HIGH (ref 70–99)

## 2021-05-07 MED ORDER — FENTANYL CITRATE (PF) 100 MCG/2ML IJ SOLN
INTRAMUSCULAR | Status: AC
  Filled 2021-05-07: qty 2

## 2021-05-07 MED ORDER — VANCOMYCIN HCL IN DEXTROSE 1-5 GM/200ML-% IV SOLN
1000.0000 mg | INTRAVENOUS | Status: AC
  Administered 2021-05-07: 1000 mg via INTRAVENOUS

## 2021-05-07 MED ORDER — MIDAZOLAM HCL 2 MG/2ML IJ SOLN
INTRAMUSCULAR | Status: AC
  Filled 2021-05-07: qty 2

## 2021-05-07 MED ORDER — TOBRAMYCIN SULFATE 1.2 G IJ SOLR
INTRAMUSCULAR | Status: AC
  Filled 2021-05-07: qty 1.2

## 2021-05-07 MED ORDER — VANCOMYCIN HCL IN DEXTROSE 1-5 GM/200ML-% IV SOLN
INTRAVENOUS | Status: AC
  Filled 2021-05-07: qty 200

## 2021-05-07 MED ORDER — BUPIVACAINE HCL 0.25 % IJ SOLN
INTRAMUSCULAR | Status: AC | PRN
  Administered 2021-05-07: 20 mL

## 2021-05-07 MED ORDER — BUPIVACAINE HCL (PF) 0.5 % IJ SOLN
INTRAMUSCULAR | Status: AC
  Filled 2021-05-07: qty 30

## 2021-05-07 MED ORDER — IMBRUVICA 420 MG PO TABS
ORAL_TABLET | ORAL | 3 refills | Status: DC
  Filled 2021-05-07: qty 28, 28d supply, fill #0
  Filled 2021-06-03: qty 28, 28d supply, fill #1
  Filled 2021-07-02: qty 28, 28d supply, fill #2
  Filled 2021-07-29: qty 28, 28d supply, fill #3

## 2021-05-07 MED ORDER — FENTANYL CITRATE (PF) 100 MCG/2ML IJ SOLN
INTRAMUSCULAR | Status: AC | PRN
  Administered 2021-05-07: 25 ug via INTRAVENOUS
  Administered 2021-05-07 (×2): 12.5 ug via INTRAVENOUS

## 2021-05-07 MED ORDER — SODIUM CHLORIDE 0.9 % IV SOLN: INTRAVENOUS | Status: DC

## 2021-05-07 MED ORDER — MIDAZOLAM HCL 2 MG/2ML IJ SOLN
INTRAMUSCULAR | Status: AC | PRN
  Administered 2021-05-07: 0.5 mg via INTRAVENOUS
  Administered 2021-05-07: 1 mg via INTRAVENOUS
  Administered 2021-05-07: 0.5 mg via INTRAVENOUS

## 2021-05-07 MED ORDER — IOHEXOL 300 MG/ML  SOLN
50.0000 mL | Freq: Once | INTRAMUSCULAR | Status: AC | PRN
  Administered 2021-05-07: 20 mL via INTRAVENOUS

## 2021-05-07 MED ORDER — SODIUM CHLORIDE 0.9 % IV SOLN: INTRAVENOUS | Status: AC

## 2021-05-07 NOTE — Procedures (Signed)
S/P T 12 balloon KP S.Elmus Mathes MD

## 2021-05-07 NOTE — Progress Notes (Addendum)
Ice pack applied to mid back Discharge instructions reviewed with pt and her daughter.  Both voice understanding.

## 2021-05-07 NOTE — Discharge Instructions (Addendum)
1.No stooping,or bending or lifting more than 10 lbs for 2 weeks  2.Use walker to ambulate for 2 weeks. 3.No drivingfor 2 weeks. 4.RTC PRN 2 weeks  KYPHOPLASTY/VERTEBROPLASTY DISCHARGE INSTRUCTIONS  Medications: (check all that apply)     Resume all home medications as before procedure.                      Continue your pain medications as prescribed as needed.  Over the next 3-5 days, decrease your pain medication as tolerated.  Over the counter medications (i.e. Tylenol, ibuprofen, and aleve) may be substituted once severe/moderate pain symptoms have subsided.   Wound Care: Bandages may be removed the day following your procedure.  You may get your incision wet once bandages are removed.  Bandaids may be used to cover the incisions until scab formation.  Topical ointments are optional.  If you develop a fever greater than 101 degrees, have increased skin redness at the incision sites or pus-like oozing from incisions occurring within 1 week of the procedure, contact radiology at [phone] or [phone].  Ice pack to back for 15-20 minutes 2-3 time per day for first 2-3 days post procedure.  The ice will expedite muscle healing and help with the pain from the incisions.   Activity: Bedrest today with limited activity for 24 hours post procedure.  No driving for 48 hours.  Increase your activity as tolerated after bedrest (with assistance if necessary).  Refrain from any strenuous activity or heavy lifting (greater than 10 lbs.).   Follow up: Contact radiology at [phone] or [phone] if any questions/concerns.  A physician assistant from radiology will contact you in approximately 1 week.  If a biopsy was performed at the time of your procedure, your referring physician should receive the results in usually 2-3 days.

## 2021-05-12 ENCOUNTER — Other Ambulatory Visit (HOSPITAL_COMMUNITY): Payer: Self-pay

## 2021-05-19 ENCOUNTER — Other Ambulatory Visit (HOSPITAL_COMMUNITY): Payer: Self-pay

## 2021-05-24 ENCOUNTER — Inpatient Hospital Stay: Payer: Medicare Other | Attending: Oncology | Admitting: Oncology

## 2021-05-24 ENCOUNTER — Other Ambulatory Visit: Payer: Self-pay

## 2021-05-24 ENCOUNTER — Inpatient Hospital Stay: Payer: Medicare Other

## 2021-05-24 VITALS — BP 144/65 | HR 82 | Temp 98.0°F | Resp 18 | Ht 67.0 in | Wt 186.2 lb

## 2021-05-24 DIAGNOSIS — C9110 Chronic lymphocytic leukemia of B-cell type not having achieved remission: Secondary | ICD-10-CM | POA: Insufficient documentation

## 2021-05-24 DIAGNOSIS — G35 Multiple sclerosis: Secondary | ICD-10-CM | POA: Diagnosis not present

## 2021-05-24 DIAGNOSIS — Z8781 Personal history of (healed) traumatic fracture: Secondary | ICD-10-CM | POA: Insufficient documentation

## 2021-05-24 DIAGNOSIS — J449 Chronic obstructive pulmonary disease, unspecified: Secondary | ICD-10-CM | POA: Insufficient documentation

## 2021-05-24 DIAGNOSIS — Z85820 Personal history of malignant melanoma of skin: Secondary | ICD-10-CM | POA: Diagnosis not present

## 2021-05-24 DIAGNOSIS — E119 Type 2 diabetes mellitus without complications: Secondary | ICD-10-CM | POA: Insufficient documentation

## 2021-05-24 DIAGNOSIS — R21 Rash and other nonspecific skin eruption: Secondary | ICD-10-CM | POA: Diagnosis not present

## 2021-05-24 DIAGNOSIS — Z9221 Personal history of antineoplastic chemotherapy: Secondary | ICD-10-CM | POA: Insufficient documentation

## 2021-05-24 LAB — CMP (CANCER CENTER ONLY)
ALT: 11 U/L (ref 0–44)
AST: 14 U/L — ABNORMAL LOW (ref 15–41)
Albumin: 4.2 g/dL (ref 3.5–5.0)
Alkaline Phosphatase: 68 U/L (ref 38–126)
Anion gap: 7 (ref 5–15)
BUN: 13 mg/dL (ref 8–23)
CO2: 31 mmol/L (ref 22–32)
Calcium: 9.6 mg/dL (ref 8.9–10.3)
Chloride: 102 mmol/L (ref 98–111)
Creatinine: 0.71 mg/dL (ref 0.44–1.00)
GFR, Estimated: >60 mL/min (ref >60)
Glucose, Bld: 105 mg/dL — ABNORMAL HIGH (ref 70–99)
Potassium: 4 mmol/L (ref 3.5–5.1)
Sodium: 140 mmol/L (ref 135–145)
Total Bilirubin: 0.7 mg/dL (ref 0.3–1.2)
Total Protein: 6.2 g/dL — ABNORMAL LOW (ref 6.5–8.1)

## 2021-05-24 LAB — CBC WITH DIFFERENTIAL (CANCER CENTER ONLY)
Abs Immature Granulocytes: 0.03 10*3/uL (ref 0.00–0.07)
Basophils Absolute: 0.1 10*3/uL (ref 0.0–0.1)
Basophils Relative: 1 %
Eosinophils Absolute: 0.1 10*3/uL (ref 0.0–0.5)
Eosinophils Relative: 1 %
HCT: 43.2 % (ref 36.0–46.0)
Hemoglobin: 13.9 g/dL (ref 12.0–15.0)
Immature Granulocytes: 0 %
Lymphocytes Relative: 17 %
Lymphs Abs: 1.4 10*3/uL (ref 0.7–4.0)
MCH: 28.5 pg (ref 26.0–34.0)
MCHC: 32.2 g/dL (ref 30.0–36.0)
MCV: 88.5 fL (ref 80.0–100.0)
Monocytes Absolute: 0.6 10*3/uL (ref 0.1–1.0)
Monocytes Relative: 7 %
Neutro Abs: 6.4 10*3/uL (ref 1.7–7.7)
Neutrophils Relative %: 74 %
Platelet Count: 174 10*3/uL (ref 150–400)
RBC: 4.88 MIL/uL (ref 3.87–5.11)
RDW: 13.1 % (ref 11.5–15.5)
WBC Count: 8.5 10*3/uL (ref 4.0–10.5)
nRBC: 0 % (ref 0.0–0.2)

## 2021-05-24 LAB — LACTATE DEHYDROGENASE: LDH: 123 U/L (ref 98–192)

## 2021-05-24 NOTE — Progress Notes (Signed)
[facility] OFFICE PROGRESS NOTE   Diagnosis: CLL  INTERVAL HISTORY:   Rachel Coffey returns as scheduled.  She continues ibrutinib.  No fever, night sweats, or palpable lymph nodes.  No bleeding.  She developed a rash after starting meloxicam for back pain..  She discontinue meloxicam and the rash has improved.  She fell in late May and developed a T12 compression fracture.  She underwent a T12 kyphoplasty procedure on 05/07/2021.  A biopsy was performed and revealed scant marrow tissue, suboptimal for evaluation for lymphoproliferative process.  She reports the pain is significantly improved.  Objective:  Vital signs in last 24 hours:  Blood pressure (!) 144/65, pulse 82, temperature 98 F (36.7 C), temperature source Oral, resp. rate 18, height 5' 7"  (1.702 m), weight 186 lb 3.2 oz (84.5 kg), SpO2 97 %.    Lymphatics: No cervical, supraclavicular, axillary, or inguinal nodes Resp: Lungs clear bilaterally Cardio: Regular rate and rhythm GI: No mass, nontender, no hepatosplenomegaly Vascular: No leg edema  Skin: Dry fading rash over the anterior chest and lower back   Lab Results:  Lab Results  Component Value Date   WBC 8.5 05/24/2021   HGB 13.9 05/24/2021   HCT 43.2 05/24/2021   MCV 88.5 05/24/2021   PLT 174 05/24/2021   NEUTROABS 6.4 05/24/2021    CMP  Lab Results  Component Value Date   NA 140 05/24/2021   K 4.0 05/24/2021   CL 102 05/24/2021   CO2 31 05/24/2021   GLUCOSE 105 (H) 05/24/2021   BUN 13 05/24/2021   CREATININE 0.71 05/24/2021   CALCIUM 9.6 05/24/2021   PROT 6.2 (L) 05/24/2021   ALBUMIN 4.2 05/24/2021   AST 14 (L) 05/24/2021   ALT 11 05/24/2021   ALKPHOS 68 05/24/2021   BILITOT 0.7 05/24/2021   GFRNONAA >60 05/24/2021   GFRAA >60 06/22/2020     Medications: I have reviewed the patient's current medications.   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  06/20/2016 Monoclonal Kappa restricted B-cell population identified on flow cytometry Staging PET scan 06/13/2016 confirmed hypermetabolic abdominal/pelvic lymphadenopathy FISH panel positive for 11q-,-12,13q-, and 17p-(10% of cells) Peripheral blood flow cytometry 09/22/2016 at [facility]-CD5/CD23 positive For restricted B-cell population, 2% of lymphocytes Peripheral blood IGH mutation analysis at [facility] 09/22/2016-unmutated CT 06/06/2017-generally stable diffuse lymphadenopathy, node in the pelvis is smaller, some the lymph nodes are larger CTs 07/02/2018- slight decrease in size of retrocrural, retroperitoneal, and mesenteric adenopathy, new 6 mm left upper lobe nodule CT chest 01/01/2019- increase in size of left upper lobe lung nodule-inflammatory appearance, other stable lung nodules, right retrocrural and upper abdominal adenopathy has progressed CTs 07/04/2019- progressive lower thoracic and abdominal adenopathy with dominant mass-effect on the stomach and pancreas, new heterogeneity in the spleen- possible splenic lesion, stable lung nodules Ibrutinib starting 08/14/2019 CT abdomen/pelvis 11/19/2019-decreased size of dominant abdominal mass and decreased abdominal lymphadenopathy CTs 06/22/2020-2 upper abdominal lymph nodes have decreased in size, no evidence of disease progression, unchanged bilateral lung nodules   Stage IB (T1b,N0) melanoma of the right forearm, status post a wide excision and sentinel lymph node biopsy at [facility] March 2017     3.    Multiple sclerosis   4.    Diabetes   5.   COPD  6.  T12 compression fracture after a fall May 22, T12 kyphoplasty 05/07/2021    Disposition: Rachel Coffey is stable from a hematologic standpoint.  She will continue ibrutinib.  I think  it is unlikely the T12 compression fracture is related to the diagnosis of CLL.  She is scheduled to see Dr. Saintclair Halsted this week for ongoing management of the compression fracture.  Rachel Coffey will be referred for a  second dose of Evusheld in 2 months.  She will return for an office and lab visit in 4 months.  [name], MD  05/24/2021  11:08 AM

## 2021-05-25 DIAGNOSIS — S22080A Wedge compression fracture of T11-T12 vertebra, initial encounter for closed fracture: Secondary | ICD-10-CM | POA: Diagnosis not present

## 2021-06-03 ENCOUNTER — Other Ambulatory Visit (HOSPITAL_COMMUNITY): Payer: Self-pay

## 2021-06-09 ENCOUNTER — Other Ambulatory Visit (HOSPITAL_COMMUNITY): Payer: Self-pay

## 2021-06-10 DIAGNOSIS — R030 Elevated blood-pressure reading, without diagnosis of hypertension: Secondary | ICD-10-CM | POA: Diagnosis not present

## 2021-06-10 DIAGNOSIS — S22080A Wedge compression fracture of T11-T12 vertebra, initial encounter for closed fracture: Secondary | ICD-10-CM | POA: Diagnosis not present

## 2021-06-10 DIAGNOSIS — Z6829 Body mass index (BMI) 29.0-29.9, adult: Secondary | ICD-10-CM | POA: Diagnosis not present

## 2021-06-15 ENCOUNTER — Other Ambulatory Visit: Payer: Self-pay

## 2021-06-15 ENCOUNTER — Encounter: Payer: Self-pay | Admitting: Podiatry

## 2021-06-15 ENCOUNTER — Ambulatory Visit (INDEPENDENT_AMBULATORY_CARE_PROVIDER_SITE_OTHER): Payer: Medicare Other | Admitting: Podiatry

## 2021-06-15 DIAGNOSIS — L600 Ingrowing nail: Secondary | ICD-10-CM

## 2021-06-15 NOTE — Progress Notes (Signed)
Subjective:  Patient ID: Rachel Coffey, female    DOB: 1951/03/16,  MRN: [id number]  No chief complaint on file.   70 y.o. female presents with the above complaint.  Patient presents with recurrence of right third digit and lateral border ingrown.  Patient stated painful to touch.  Patient would like to have it removed again.  She had it done earlier this year however seems like it may have came back.  She denies any other acute complaints she has not seen and was prior to seeing me.   Review of Systems: Negative except as noted in the HPI. Denies N/V/F/Ch.  Past Medical History:  Diagnosis Date   Asthma    stress induced with allergies   Diabetes mellitus without complication ([place])    Genital warts    Hyperlipidemia    MS (multiple sclerosis) (HCC)    Neuromuscular disorder (HCC)    dx. "31- MS" -Dr. Barkley Boards, [place](Neuropathy and "band around the chest"   STD (sexually transmitted disease)    genital warts--years ago    Current Outpatient Medications:    acetaminophen (TYLENOL) 500 MG tablet, Take 500 mg by mouth in the morning and at bedtime., Disp: , Rfl:    albuterol (PROVENTIL HFA;VENTOLIN HFA) 108 (90 BASE) MCG/ACT inhaler, Inhale 2 puffs into the lungs every 6 (six) hours as needed for wheezing., Disp: , Rfl:    ALPRAZolam (XANAX) 0.5 MG tablet, Take 0.5 mg by mouth daily as needed (for pain). , Disp: , Rfl:    baclofen (LIORESAL) 10 MG tablet, Take 5 mg by mouth 3 (three) times daily as needed., Disp: , Rfl:    Cholecalciferol (VITAMIN D) 50 MCG (2000 UT) CAPS, Take 2,000 Units by mouth daily. , Disp: , Rfl:    diclofenac Sodium (VOLTAREN) 1 % GEL, Apply 1 application topically 4 (four) times daily as needed (pain)., Disp: , Rfl:    esomeprazole (NEXIUM) 20 MG capsule, Take 20 mg by mouth daily., Disp: , Rfl:    fluticasone (FLONASE) 50 MCG/ACT nasal spray, Place 1 spray into both nostrils daily as needed for allergies. Sometimes bid, Disp: , Rfl:    gabapentin  (NEURONTIN) 100 MG capsule, Take 100 mg by mouth at bedtime., Disp: , Rfl:    glipiZIDE (GLUCOTROL XL) 2.5 MG 24 hr tablet, Take 2.5 mg by mouth 2 (two) times daily. , Disp: , Rfl:    ibrutinib (IMBRUVICA) 420 MG TABS, TAKE 1 TABLET (420 MG) BY MOUTH DAILY. ADMINISTER WITH WATER AT APPROXIMATELY THE SAME TIME EACH DAY., Disp: 28 tablet, Rfl: 3   loratadine (CLARITIN) 10 MG tablet, Take 10 mg by mouth daily as needed (allergies)., Disp: , Rfl:    losartan (COZAAR) 25 MG tablet, Take 25 mg by mouth daily., Disp: , Rfl:    meloxicam (MOBIC) 15 MG tablet, Take 15 mg by mouth daily as needed for pain., Disp: , Rfl:    OVER THE COUNTER MEDICATION, Apply 1 application topically daily as needed (pain). Level Select CBD Sport cream, Disp: , Rfl:    simvastatin (ZOCOR) 20 MG tablet, Take 20 mg by mouth at bedtime., Disp: , Rfl:   Social History   Tobacco Use  Smoking Status Former   Types: Cigarettes   Quit date: 01/31/2005   Years since quitting: 16.3  Smokeless Tobacco Never    Allergies  Allergen Reactions   Amoxicillin-Pot Clavulanate Other (See Comments)    Pt preferred - causes upset stomach    Codeine Itching   Fenofibrate  aching joints   Hydrocodone Itching   Hydrocodone-Acetaminophen Itching   Lisinopril     Unknown   Metformin And Related     Makes pt deathly ill   Objective:  There were no vitals filed for this visit. There is no height or weight on file to calculate BMI. Constitutional Well developed. Well nourished.  Vascular Dorsalis pedis pulses palpable bilaterally. Posterior tibial pulses palpable bilaterally. Capillary refill normal to all digits.  No cyanosis or clubbing noted. Pedal hair growth normal.  Neurologic Normal speech. Oriented to person, place, and time. Epicritic sensation to light touch grossly present bilaterally.  Dermatologic Painful ingrowing nail at lateral nail borders of the Third nail right. No other open wounds. No skin lesions.   Orthopedic: Normal joint ROM without pain or crepitus bilaterally. No visible deformities. No bony tenderness.   Radiographs: None Assessment:   1. Ingrown nail of third toe of right foot    Plan:  Patient was evaluated and treated and all questions answered.  Ingrown Nail, right -Patient elects to proceed with minor surgery to remove ingrown toenail removal today. Consent reviewed and signed by patient. -Ingrown nail excised. See procedure note. -Educated on post-procedure care including soaking. Written instructions provided and reviewed. -Patient to follow up in 2 weeks for nail check.  Procedure: Excision of Ingrown Toenail Location: Right 3rd toe lateral nail borders. Anesthesia: Lidocaine 1% plain; 1.5 mL and Marcaine 0.5% plain; 1.5 mL, digital block. Skin Prep: Betadine. Dressing: Silvadene; telfa; dry, sterile, compression dressing. Technique: Following skin prep, the toe was exsanguinated and a tourniquet was secured at the base of the toe. The affected nail border was freed, split with a nail splitter, and excised. Chemical matrixectomy was then performed with phenol and irrigated out with alcohol. The tourniquet was then removed and sterile dressing applied. Disposition: Patient tolerated procedure well. Patient to return in 2 weeks for follow-up.   No follow-ups on file.

## 2021-06-29 ENCOUNTER — Other Ambulatory Visit: Payer: Self-pay

## 2021-06-29 ENCOUNTER — Ambulatory Visit (INDEPENDENT_AMBULATORY_CARE_PROVIDER_SITE_OTHER): Payer: Medicare Other | Admitting: Podiatry

## 2021-06-29 DIAGNOSIS — L600 Ingrowing nail: Secondary | ICD-10-CM

## 2021-06-29 NOTE — Progress Notes (Signed)
Subjective: Rachel Coffey is a 70 y.o.  female returns to office today for follow up evaluation after having right third Lateral border nail avulsion performed. Patient has been soaking using epsom salt and applying topical antibiotic covered with bandaid daily. Patient denies fevers, chills, nausea, vomiting. Denies any calf pain, chest pain, SOB.   Objective:  Vitals: Reviewed  General: Well developed, nourished, in no acute distress, alert and oriented x3   Dermatology: Skin is warm, dry and supple bilateral. Lateral third nail border appears to be clean, dry, with mild granular tissue and surrounding scab. There is no surrounding erythema, edema, drainage/purulence. The remaining nails appear unremarkable at this time. There are no other lesions or other signs of infection present.  Neurovascular status: Intact. No lower extremity swelling; No pain with calf compression bilateral.  Musculoskeletal: Decreased tenderness to palpation of the Lateral third nail fold(s). Muscular strength within normal limits bilateral.   Assesement and Plan: S/p partial nail avulsion, doing well.   -Continue soaking in epsom salts twice a day followed by antibiotic ointment and a band-aid as needed. Can leave uncovered at night. .  -If the area does not heal properply, call the office for follow-up appointment, or sooner if any problems arise.  -Monitor for any signs/symptoms of infection. Call the office immediately if any occur or go directly to the emergency room. Call with any questions/concerns.  Boneta Lucks, DPM

## 2021-07-02 ENCOUNTER — Other Ambulatory Visit (HOSPITAL_COMMUNITY): Payer: Self-pay

## 2021-07-07 ENCOUNTER — Other Ambulatory Visit (HOSPITAL_COMMUNITY): Payer: Self-pay

## 2021-07-13 ENCOUNTER — Encounter: Payer: Self-pay | Admitting: Oncology

## 2021-07-16 ENCOUNTER — Other Ambulatory Visit: Payer: Self-pay | Admitting: Nurse Practitioner

## 2021-07-16 DIAGNOSIS — C9110 Chronic lymphocytic leukemia of B-cell type not having achieved remission: Secondary | ICD-10-CM

## 2021-07-26 ENCOUNTER — Telehealth: Payer: Self-pay | Admitting: Pharmacy Technician

## 2021-07-26 NOTE — Telephone Encounter (Signed)
Oral Oncology Patient Advocate Encounter   Was successful in renewing patients grant from Patient [place] [facility]) in the amount of $9300 to provide copayment coverage for Imbruvica.  This will keep the out of pocket expense at $0.    The billing information is as follows and has been shared with [place].   Member ID: [id number] Group ID: [id number] RxBin: [id number] Dates of Eligibility: 08/07/21 through 08/06/22  Fund:  [place] [facility] [place] Phone [phone] Fax [phone] 07/26/2021 2:47 PM

## 2021-07-27 DIAGNOSIS — E559 Vitamin D deficiency, unspecified: Secondary | ICD-10-CM | POA: Diagnosis not present

## 2021-07-27 DIAGNOSIS — G629 Polyneuropathy, unspecified: Secondary | ICD-10-CM | POA: Diagnosis not present

## 2021-07-27 DIAGNOSIS — E785 Hyperlipidemia, unspecified: Secondary | ICD-10-CM | POA: Diagnosis not present

## 2021-07-27 DIAGNOSIS — M4854XA Collapsed vertebra, not elsewhere classified, thoracic region, initial encounter for fracture: Secondary | ICD-10-CM | POA: Diagnosis not present

## 2021-07-27 DIAGNOSIS — M1990 Unspecified osteoarthritis, unspecified site: Secondary | ICD-10-CM | POA: Diagnosis not present

## 2021-07-27 DIAGNOSIS — E1169 Type 2 diabetes mellitus with other specified complication: Secondary | ICD-10-CM | POA: Diagnosis not present

## 2021-07-27 DIAGNOSIS — Z79899 Other long term (current) drug therapy: Secondary | ICD-10-CM | POA: Diagnosis not present

## 2021-07-27 DIAGNOSIS — C9110 Chronic lymphocytic leukemia of B-cell type not having achieved remission: Secondary | ICD-10-CM | POA: Diagnosis not present

## 2021-07-28 ENCOUNTER — Encounter: Payer: Self-pay | Admitting: Podiatry

## 2021-07-29 ENCOUNTER — Other Ambulatory Visit (HOSPITAL_COMMUNITY): Payer: Self-pay

## 2021-07-29 ENCOUNTER — Ambulatory Visit: Payer: Medicare Other | Admitting: Podiatry

## 2021-07-30 ENCOUNTER — Encounter: Payer: Self-pay | Admitting: Oncology

## 2021-07-31 LAB — SURGICAL PATHOLOGY

## 2021-08-03 ENCOUNTER — Telehealth: Payer: Self-pay | Admitting: Family Medicine

## 2021-08-03 NOTE — Telephone Encounter (Signed)
Cortlyn Cannell Dola Fojtik's wife) stated at his last visit, not today but last time, you ok'd for her to be seen as one of your patients.  I just wanted to verify this before scheduling her as a new patient.

## 2021-08-04 ENCOUNTER — Other Ambulatory Visit (HOSPITAL_COMMUNITY): Payer: Self-pay

## 2021-08-04 NOTE — Telephone Encounter (Signed)
Please advise 

## 2021-08-04 NOTE — Telephone Encounter (Signed)
Yes I agreed to see her  

## 2021-08-06 DIAGNOSIS — Z1389 Encounter for screening for other disorder: Secondary | ICD-10-CM | POA: Diagnosis not present

## 2021-08-06 DIAGNOSIS — Z0000 Encounter for general adult medical examination without abnormal findings: Secondary | ICD-10-CM | POA: Diagnosis not present

## 2021-08-09 ENCOUNTER — Other Ambulatory Visit: Payer: Self-pay

## 2021-08-09 ENCOUNTER — Inpatient Hospital Stay: Payer: Medicare Other | Attending: Oncology

## 2021-08-09 DIAGNOSIS — C9110 Chronic lymphocytic leukemia of B-cell type not having achieved remission: Secondary | ICD-10-CM | POA: Diagnosis not present

## 2021-08-09 DIAGNOSIS — Z298 Encounter for other specified prophylactic measures: Secondary | ICD-10-CM | POA: Diagnosis not present

## 2021-08-09 MED ORDER — CILGAVIMAB (PART OF EVUSHELD) INJECTION
300.0000 mg | Freq: Once | INTRAMUSCULAR | Status: AC
  Administered 2021-08-09: 300 mg via INTRAMUSCULAR

## 2021-08-09 MED ORDER — TIXAGEVIMAB (PART OF EVUSHELD) INJECTION
300.0000 mg | Freq: Once | INTRAMUSCULAR | Status: AC
  Administered 2021-08-09: 300 mg via INTRAMUSCULAR

## 2021-08-09 NOTE — Patient Instructions (Signed)
Tixagevimab; Cilgavimab Solutions for Injection What is this medication? TIXAGEVIMAB; CILGAVIMAB (tix a jev i mab; sil gav i mab) reduces the risk of getting COVID-19 in persons with immune system problems who may not respond properly to the COVID-19 vaccine or persons who can't receive the COVID-19 vaccine. It belongs to a group of medications called monoclonal antibodies. It may decrease the risk of developing severe symptoms of COVID-19. It may also decrease the chance of going to the hospital. This medication is not approved by the FDA. The FDA has authorized emergency use of this medication during the COVID-19 pandemic. This medicine may be used for other purposes; ask your health care provider or pharmacist if you have questions. COMMON BRAND NAME(S): EVUSHELD What should I tell my care team before I take this medication? They need to know if you have any of these conditions: any allergies any serious illness bleeding disorder have received a COVID-19 vaccine heart disease an unusual or allergic reaction to tixagevimab, cilgavimab, other medications, foods, dyes, or preservatives pregnant or trying to get pregnant breast-feeding How should I use this medication? This medication is injected into a muscle. It is given by your care team in a hospital or clinic setting. Talk to your care team about the use of this medication in children. While it may be given to children as young as 12 years, precautions do apply. Overdosage: If you think you have taken too much of this medicine contact a poison control center or emergency room at once. NOTE: This medicine is only for you. Do not share this medicine with others. What if I miss a dose? Keep appointments for follow-up doses. It is important not to miss your dose. Call your care team if you are unable to keep an appointment. What may interact with this medication? COVID-19 vaccines This list may not describe all possible interactions. Give  your health care provider a list of all the medicines, herbs, non-prescription drugs, or dietary supplements you use. Also tell them if you smoke, drink alcohol, or use illegal drugs. Some items may interact with your medicine. What should I watch for while using this medication? Your condition will be monitored carefully while you are receiving this medication. Visit your care team for regular checks on your progress. Tell your care team if your symptoms do not start to get better or if they get worse. After receiving this medication, wait at least 14 days before getting the COVID-19 vaccine. What side effects may I notice from receiving this medication? Side effects that you should report to your care team as soon as possible: Allergic reactions-skin rash, itching, hives, swelling of the face, lips, tongue, or throat Heart attack-pain or tightness in the chest, shoulders, arms, or jaw, nausea, shortness of breath, cold or clammy skin, feeling faint or lightheaded Heart failure-shortness of breath, swelling of the ankles, feet, or hands, sudden weight gain, unusual weakness or fatigue Heart rhythm changes-fast or irregular heartbeat, dizziness, feeling faint or lightheaded, chest pain, trouble breathing Side effects that usually do not require medical attention (report these to your care team if they continue or are bothersome): Cough Fatigue Headache Pain, redness, or irritation at site where injected This list may not describe all possible side effects. Call your doctor for medical advice about side effects. You may report side effects to FDA at 1-800-FDA-1088. Where should I keep my medication? This medication is given in a hospital or clinic. It will not be stored at home. NOTE: This sheet is  a summary. It may not cover all possible information. If you have questions about this medicine, talk to your doctor, pharmacist, or health care provider.  2022 Elsevier/Gold Standard (2020-10-22  16:22:16)

## 2021-08-09 NOTE — Progress Notes (Signed)
Patient observed for 30 minutes post injections. VSS upon leaving infusion room. Patient given education information on Evusheld. Instructed patient to call office with any questions or concerns. Patient verbalized understanding.

## 2021-08-10 ENCOUNTER — Encounter: Payer: Self-pay | Admitting: Family Medicine

## 2021-08-10 ENCOUNTER — Ambulatory Visit (INDEPENDENT_AMBULATORY_CARE_PROVIDER_SITE_OTHER): Payer: Medicare Other | Admitting: Family Medicine

## 2021-08-10 ENCOUNTER — Encounter: Payer: Self-pay | Admitting: Oncology

## 2021-08-10 ENCOUNTER — Other Ambulatory Visit: Payer: Self-pay | Admitting: Family Medicine

## 2021-08-10 VITALS — BP 130/64 | HR 85 | Temp 98.8°F | Ht 67.0 in | Wt 187.4 lb

## 2021-08-10 DIAGNOSIS — F419 Anxiety disorder, unspecified: Secondary | ICD-10-CM | POA: Diagnosis not present

## 2021-08-10 DIAGNOSIS — S22020S Wedge compression fracture of second thoracic vertebra, sequela: Secondary | ICD-10-CM

## 2021-08-10 DIAGNOSIS — M159 Polyosteoarthritis, unspecified: Secondary | ICD-10-CM

## 2021-08-10 DIAGNOSIS — K219 Gastro-esophageal reflux disease without esophagitis: Secondary | ICD-10-CM

## 2021-08-10 DIAGNOSIS — G35 Multiple sclerosis: Secondary | ICD-10-CM | POA: Diagnosis not present

## 2021-08-10 DIAGNOSIS — M8949 Other hypertrophic osteoarthropathy, multiple sites: Secondary | ICD-10-CM | POA: Diagnosis not present

## 2021-08-10 DIAGNOSIS — C9110 Chronic lymphocytic leukemia of B-cell type not having achieved remission: Secondary | ICD-10-CM

## 2021-08-10 DIAGNOSIS — E119 Type 2 diabetes mellitus without complications: Secondary | ICD-10-CM | POA: Diagnosis not present

## 2021-08-10 DIAGNOSIS — E785 Hyperlipidemia, unspecified: Secondary | ICD-10-CM

## 2021-08-10 DIAGNOSIS — J4520 Mild intermittent asthma, uncomplicated: Secondary | ICD-10-CM

## 2021-08-10 DIAGNOSIS — S22020A Wedge compression fracture of second thoracic vertebra, initial encounter for closed fracture: Secondary | ICD-10-CM | POA: Insufficient documentation

## 2021-08-10 MED ORDER — GLIPIZIDE ER 2.5 MG PO TB24: 2.5000 mg | ORAL_TABLET | Freq: Two times a day (BID) | ORAL | 3 refills | Status: DC

## 2021-08-10 NOTE — Progress Notes (Signed)
   Subjective:    Patient ID: Rachel Coffey, female    DOB: 1951-06-21, 70 y.o.   MRN: [id number]  HPI Here to establish with [place] for primary care after transferring from Dr. Kathyrn Lass at [place]. I already see her husband and her daughter. She has a number of ongoing issues, but the most pressing lately is a compression fracture to the T12 vertebra. This was the result of a fall she took earlier this summer. She eventually had kyphoplasty on this per Dr. Estanislado Pandy which did partially relieve her pain, but she still has significant stiffness and pain in the central back area. She is seeing Dr. Saintclair Halsted for this and they are considering PT. She sees Dr. Benay Spice for chronic lymphocytic leukemia, and this has been well controlled by taking Imbruvica. She recently got a shot of Evusheld which is designed to boost her immune system. She sees Dr. Otto Herb in [place] for multiple sclerosis, and this remains in remission off medications for the past 8 years. She has type 2 diabetes and this is well controlled by taking Glipizide. She cannot tolerate Metformin. Her last A1c on 07-27-21 was excellent at 6.3%.   Review of Systems  Constitutional: Negative.   Respiratory: Negative.    Cardiovascular: Negative.   Gastrointestinal: Negative.   Genitourinary: Negative.   Musculoskeletal:  Positive for arthralgias and back pain.  Neurological: Negative.       Objective:   Physical Exam Constitutional:      Appearance: Normal appearance.  Cardiovascular:     Rate and Rhythm: Normal rate and regular rhythm.     Pulses: Normal pulses.     Heart sounds: Normal heart sounds.  Pulmonary:     Effort: Pulmonary effort is normal.     Breath sounds: Normal breath sounds.  Musculoskeletal:     Right lower leg: No edema.     Left lower leg: No edema.  Lymphadenopathy:     Cervical: No cervical adenopathy.  Neurological:     Mental Status: She is alert.          Assessment & Plan:  Intro visit for  this woman with many ongoing medical issues. She will follow up with all the specialists above. We will get records from Dr. Pamalee Leyden. She is UTD on all immunizations. Alysia Penna, MD

## 2021-08-11 ENCOUNTER — Encounter: Payer: Self-pay | Admitting: Family Medicine

## 2021-08-11 NOTE — Telephone Encounter (Signed)
Let's plan on seeing her every 3 months at least for now

## 2021-08-11 NOTE — Telephone Encounter (Signed)
I printed these documents out. Please enter this data into her chart

## 2021-08-13 ENCOUNTER — Telehealth: Payer: Self-pay

## 2021-08-13 NOTE — Telephone Encounter (Signed)
Pt medical records have been received and sent to scanning

## 2021-08-16 NOTE — Telephone Encounter (Signed)
Pt documents have been sent to scanning

## 2021-08-17 ENCOUNTER — Other Ambulatory Visit: Payer: Self-pay

## 2021-08-17 ENCOUNTER — Ambulatory Visit (INDEPENDENT_AMBULATORY_CARE_PROVIDER_SITE_OTHER): Payer: Medicare Other | Admitting: Podiatry

## 2021-08-17 ENCOUNTER — Encounter: Payer: Self-pay | Admitting: Podiatry

## 2021-08-17 DIAGNOSIS — M76811 Anterior tibial syndrome, right leg: Secondary | ICD-10-CM

## 2021-08-17 NOTE — Progress Notes (Signed)
Subjective:  Patient ID: Rachel Coffey, female    DOB: [birth year],  MRN: [id number]  Chief Complaint  Patient presents with   Foot Pain    Patient presents for injection in right foot today     70 y.o. female presents with the above complaint.  Patient presents for follow-up of right tibialis anterior tendinitis/midfoot arthritis.  She states the pain has gotten worse over the last few times.  Patient states that the injection does help a lot.  She would like to know if she can do 1 more injection as it is causing to throw off her gait and she just had a recent back surgery done.  She denies any other acute complaints   Review of Systems: Negative except as noted in the HPI. Denies N/V/F/Ch.  Past Medical History:  Diagnosis Date   Asthma    stress induced with allergies   Cancer ([place])    CLL (chronic lymphocytic leukemia) ([place])    sees Dr. Benay Spice   Compression fracture of thoracic vertebra ([place])    at T12, sees Dr. Kary Kos   Genital warts    Hyperlipidemia    MS (multiple sclerosis) [facility])    sees Dr. Otto Herb of [facility] at the [facility] office   Type 2 diabetes mellitus [facility])     Current Outpatient Medications:    acetaminophen (TYLENOL) 500 MG tablet, Take 500 mg by mouth in the morning and at bedtime., Disp: , Rfl:    albuterol (PROVENTIL HFA;VENTOLIN HFA) 108 (90 BASE) MCG/ACT inhaler, Inhale 2 puffs into the lungs every 6 (six) hours as needed for wheezing., Disp: , Rfl:    ALPRAZolam (XANAX) 0.5 MG tablet, Take 0.5 mg by mouth daily as needed (for pain). , Disp: , Rfl:    baclofen (LIORESAL) 10 MG tablet, Take 5 mg by mouth 3 (three) times daily as needed. (Patient not taking: Reported on 08/10/2021), Disp: , Rfl:    Cholecalciferol (VITAMIN D) 50 MCG (2000 UT) CAPS, Take 2,000 Units by mouth daily. , Disp: , Rfl:    diclofenac Sodium (VOLTAREN) 1 % GEL, Apply 1 application topically 4 (four) times daily as needed (pain)., Disp: , Rfl:    esomeprazole  (NEXIUM) 20 MG capsule, Take 20 mg by mouth daily., Disp: , Rfl:    fluticasone (FLONASE) 50 MCG/ACT nasal spray, Place 1 spray into both nostrils daily as needed for allergies. Sometimes bid, Disp: , Rfl:    gabapentin (NEURONTIN) 100 MG capsule, TAKE ONE CAPSULE BY MOUTH EVERY NIGHT AT BEDTIME, Disp: 90 capsule, Rfl: 0   glipiZIDE (GLUCOTROL XL) 2.5 MG 24 hr tablet, Take 1 tablet (2.5 mg total) by mouth 2 (two) times daily., Disp: 180 tablet, Rfl: 3   ibrutinib (IMBRUVICA) 420 MG TABS, TAKE 1 TABLET (420 MG) BY MOUTH DAILY. ADMINISTER WITH WATER AT APPROXIMATELY THE SAME TIME EACH DAY., Disp: 28 tablet, Rfl: 3   loratadine (CLARITIN) 10 MG tablet, Take 10 mg by mouth daily as needed (allergies)., Disp: , Rfl:    losartan (COZAAR) 25 MG tablet, Take 25 mg by mouth daily., Disp: , Rfl:    meloxicam (MOBIC) 15 MG tablet, Take 15 mg by mouth daily as needed for pain., Disp: , Rfl:    simvastatin (ZOCOR) 20 MG tablet, Take 20 mg by mouth at bedtime., Disp: , Rfl:   Social History   Tobacco Use  Smoking Status Former   Types: Cigarettes   Quit date: 01/31/2005   Years since quitting: 16.5  Smokeless Tobacco Never    Allergies  Allergen Reactions   Amoxicillin-Pot Clavulanate Other (See Comments)    Pt preferred - causes upset stomach    Codeine Itching   Fenofibrate     aching joints   Hydrocodone Itching   Hydrocodone-Acetaminophen Itching   Lisinopril Cough   Metformin And Related     Makes pt deathly ill   Objective:  There were no vitals filed for this visit. There is no height or weight on file to calculate BMI. Constitutional Well developed. Well nourished.  Vascular Dorsalis pedis pulses palpable bilaterally. Posterior tibial pulses palpable bilaterally. Capillary refill normal to all digits.  No cyanosis or clubbing noted. Pedal hair growth normal.  Neurologic Normal speech. Oriented to person, place, and time. Epicritic sensation to light touch grossly present  bilaterally.  Dermatologic Painful ingrowing nail at lateral nail borders of the hallux nail right. No open wounds. No skin lesions.  Orthopedic:  Pain on palpation to the insertion of the tibialis anterior tendon.  Pain with dorsiflexion inversion of the foot resisted.  No pain with plantarflexion inversion of the foot.  No pain with dorsiflexion eversion of the foot.  No pain at the ATFL ligament at the posterior tibial tendon at the Achilles tendon.   Radiographs: None Assessment:   1. Anterior tibialis tendinitis of right lower extremity     Plan:  Patient was evaluated and treated and all questions answered.  Ingrown Nail, right -Clinically healed  No follow-ups on file.  Right tibialis anterior tendinitis versus possible midfoot arthritis -I explained the patient the etiology of the tibialis anterior tendinitis and various treatment options were extensively discussed.  Given the amount of pain that she is having I believe she will benefit from a steroid injection to help decrease acute inflammatory component associate with pain.  I discussed with her that given that this is near the tendon there is a risk of rupture associated with it.  Patient would like to proceed despite the risks. -If there is no improvement we will discuss cam boot immobilization versus MRI during next clinical visit -A  steroid injection was performed at right medial foot using 1% plain Lidocaine and 10 mg of Kenalog. This was well tolerated.   No follow-ups on file.

## 2021-08-18 ENCOUNTER — Encounter: Payer: Self-pay | Admitting: Podiatry

## 2021-08-19 ENCOUNTER — Ambulatory Visit: Payer: Medicare Other | Admitting: Podiatry

## 2021-08-19 DIAGNOSIS — R030 Elevated blood-pressure reading, without diagnosis of hypertension: Secondary | ICD-10-CM | POA: Diagnosis not present

## 2021-08-19 DIAGNOSIS — Z6829 Body mass index (BMI) 29.0-29.9, adult: Secondary | ICD-10-CM | POA: Diagnosis not present

## 2021-08-19 DIAGNOSIS — S22080A Wedge compression fracture of T11-T12 vertebra, initial encounter for closed fracture: Secondary | ICD-10-CM | POA: Diagnosis not present

## 2021-08-24 DIAGNOSIS — G35 Multiple sclerosis: Secondary | ICD-10-CM | POA: Diagnosis not present

## 2021-08-25 DIAGNOSIS — Z23 Encounter for immunization: Secondary | ICD-10-CM | POA: Diagnosis not present

## 2021-08-30 DIAGNOSIS — R2681 Unsteadiness on feet: Secondary | ICD-10-CM | POA: Diagnosis not present

## 2021-08-30 DIAGNOSIS — M6281 Muscle weakness (generalized): Secondary | ICD-10-CM | POA: Diagnosis not present

## 2021-08-30 DIAGNOSIS — S22080A Wedge compression fracture of T11-T12 vertebra, initial encounter for closed fracture: Secondary | ICD-10-CM | POA: Diagnosis not present

## 2021-08-30 DIAGNOSIS — M545 Low back pain, unspecified: Secondary | ICD-10-CM | POA: Diagnosis not present

## 2021-08-31 ENCOUNTER — Other Ambulatory Visit: Payer: Self-pay | Admitting: Oncology

## 2021-08-31 ENCOUNTER — Other Ambulatory Visit (HOSPITAL_COMMUNITY): Payer: Self-pay

## 2021-08-31 DIAGNOSIS — C9110 Chronic lymphocytic leukemia of B-cell type not having achieved remission: Secondary | ICD-10-CM

## 2021-09-01 DIAGNOSIS — R2681 Unsteadiness on feet: Secondary | ICD-10-CM | POA: Diagnosis not present

## 2021-09-01 DIAGNOSIS — M545 Low back pain, unspecified: Secondary | ICD-10-CM | POA: Diagnosis not present

## 2021-09-01 DIAGNOSIS — S22080A Wedge compression fracture of T11-T12 vertebra, initial encounter for closed fracture: Secondary | ICD-10-CM | POA: Diagnosis not present

## 2021-09-01 DIAGNOSIS — M6281 Muscle weakness (generalized): Secondary | ICD-10-CM | POA: Diagnosis not present

## 2021-09-01 MED ORDER — IMBRUVICA 420 MG PO TABS
ORAL_TABLET | ORAL | 3 refills | Status: DC
  Filled 2021-09-08: qty 28, 28d supply, fill #0
  Filled 2021-10-04: qty 28, 28d supply, fill #1
  Filled 2021-11-01: qty 28, 28d supply, fill #2
  Filled 2021-11-26: qty 28, 28d supply, fill #3

## 2021-09-02 ENCOUNTER — Other Ambulatory Visit: Payer: Self-pay | Admitting: Family Medicine

## 2021-09-02 DIAGNOSIS — R591 Generalized enlarged lymph nodes: Secondary | ICD-10-CM

## 2021-09-08 ENCOUNTER — Other Ambulatory Visit (HOSPITAL_COMMUNITY): Payer: Self-pay

## 2021-09-09 DIAGNOSIS — M545 Low back pain, unspecified: Secondary | ICD-10-CM | POA: Diagnosis not present

## 2021-09-09 DIAGNOSIS — S22080A Wedge compression fracture of T11-T12 vertebra, initial encounter for closed fracture: Secondary | ICD-10-CM | POA: Diagnosis not present

## 2021-09-09 DIAGNOSIS — R2681 Unsteadiness on feet: Secondary | ICD-10-CM | POA: Diagnosis not present

## 2021-09-09 DIAGNOSIS — M6281 Muscle weakness (generalized): Secondary | ICD-10-CM | POA: Diagnosis not present

## 2021-09-13 ENCOUNTER — Other Ambulatory Visit: Payer: Self-pay | Admitting: Family Medicine

## 2021-09-13 NOTE — Telephone Encounter (Signed)
Insurance requesting alternative for Losartan 25mg .  Alternatives are:  Telmisartan 20mg , 40,80 Irbesartan 75mg , 150, 300 Valsartan 40mg ,80,160,320 Candesartan Cilexetil-4mg , 8,16,32 Olmesartan Medoxomil, 5mg , 20, 40  Please advise

## 2021-09-13 NOTE — Telephone Encounter (Signed)
Dr. Sarajane Jews patient.   Seen on 08/10/21, for new patient visit.  Requested refills were last filled from outside provider.  Labs from [facility] scanned in on 07/27/21. Patient currently on Simvastatin 20mg  is okay for patient to remain on same dosage?   Next office visit- 11/17/2021.   Can this patient receive refills?

## 2021-09-14 ENCOUNTER — Other Ambulatory Visit: Payer: Self-pay

## 2021-09-14 DIAGNOSIS — S22080A Wedge compression fracture of T11-T12 vertebra, initial encounter for closed fracture: Secondary | ICD-10-CM | POA: Diagnosis not present

## 2021-09-14 DIAGNOSIS — M545 Low back pain, unspecified: Secondary | ICD-10-CM | POA: Diagnosis not present

## 2021-09-14 DIAGNOSIS — R2681 Unsteadiness on feet: Secondary | ICD-10-CM | POA: Diagnosis not present

## 2021-09-14 DIAGNOSIS — M6281 Muscle weakness (generalized): Secondary | ICD-10-CM | POA: Diagnosis not present

## 2021-09-14 NOTE — Telephone Encounter (Signed)
When attempting to send in refill for Losartan 25mg , flagged to choose alternative.    Alternatives listed below.     If unable to view previous message will resend.

## 2021-09-15 ENCOUNTER — Telehealth: Payer: Self-pay

## 2021-09-15 ENCOUNTER — Other Ambulatory Visit: Payer: Self-pay

## 2021-09-15 MED ORDER — LOSARTAN POTASSIUM 25 MG PO TABS: 25.0000 mg | ORAL_TABLET | Freq: Every day | ORAL | 0 refills | Status: DC

## 2021-09-15 MED ORDER — SIMVASTATIN 20 MG PO TABS: 20.0000 mg | ORAL_TABLET | Freq: Every day | ORAL | 0 refills | Status: DC

## 2021-09-15 NOTE — Telephone Encounter (Signed)
Patient called requesting new Rx for  simvastatin (ZOCOR) 20 MG tablet losartan (COZAAR) 25 MG tablet Patient stated she spoke with Dr. Sarajane Jews about taking over prescriptions and that she only has a week in a half supply

## 2021-09-15 NOTE — Telephone Encounter (Signed)
LVM instructions for pt to return call.  Losartan last filled 2020. Need to verify if pt has new insurance since then.

## 2021-09-15 NOTE — Telephone Encounter (Signed)
Rx was sent per pt request

## 2021-09-16 DIAGNOSIS — M6281 Muscle weakness (generalized): Secondary | ICD-10-CM | POA: Diagnosis not present

## 2021-09-16 DIAGNOSIS — S22080A Wedge compression fracture of T11-T12 vertebra, initial encounter for closed fracture: Secondary | ICD-10-CM | POA: Diagnosis not present

## 2021-09-16 DIAGNOSIS — R2681 Unsteadiness on feet: Secondary | ICD-10-CM | POA: Diagnosis not present

## 2021-09-16 DIAGNOSIS — M545 Low back pain, unspecified: Secondary | ICD-10-CM | POA: Diagnosis not present

## 2021-09-16 NOTE — Telephone Encounter (Signed)
Pt is aware rx was sent

## 2021-09-23 DIAGNOSIS — M6281 Muscle weakness (generalized): Secondary | ICD-10-CM | POA: Diagnosis not present

## 2021-09-23 DIAGNOSIS — R2681 Unsteadiness on feet: Secondary | ICD-10-CM | POA: Diagnosis not present

## 2021-09-23 DIAGNOSIS — S22080A Wedge compression fracture of T11-T12 vertebra, initial encounter for closed fracture: Secondary | ICD-10-CM | POA: Diagnosis not present

## 2021-09-23 DIAGNOSIS — M545 Low back pain, unspecified: Secondary | ICD-10-CM | POA: Diagnosis not present

## 2021-09-24 ENCOUNTER — Inpatient Hospital Stay: Payer: Medicare Other

## 2021-09-24 ENCOUNTER — Other Ambulatory Visit: Payer: Self-pay

## 2021-09-24 ENCOUNTER — Inpatient Hospital Stay: Payer: Medicare Other | Attending: Oncology | Admitting: Oncology

## 2021-09-24 VITALS — BP 116/62 | HR 87 | Temp 98.1°F | Resp 20 | Ht 67.0 in | Wt 185.8 lb

## 2021-09-24 DIAGNOSIS — J449 Chronic obstructive pulmonary disease, unspecified: Secondary | ICD-10-CM | POA: Insufficient documentation

## 2021-09-24 DIAGNOSIS — Z85820 Personal history of malignant melanoma of skin: Secondary | ICD-10-CM | POA: Insufficient documentation

## 2021-09-24 DIAGNOSIS — C9110 Chronic lymphocytic leukemia of B-cell type not having achieved remission: Secondary | ICD-10-CM | POA: Insufficient documentation

## 2021-09-24 DIAGNOSIS — E119 Type 2 diabetes mellitus without complications: Secondary | ICD-10-CM | POA: Diagnosis not present

## 2021-09-24 DIAGNOSIS — G35 Multiple sclerosis: Secondary | ICD-10-CM | POA: Insufficient documentation

## 2021-09-24 LAB — CMP (CANCER CENTER ONLY)
ALT: 10 U/L (ref 0–44)
AST: 14 U/L — ABNORMAL LOW (ref 15–41)
Albumin: 4.3 g/dL (ref 3.5–5.0)
Alkaline Phosphatase: 48 U/L (ref 38–126)
Anion gap: 8 (ref 5–15)
BUN: 13 mg/dL (ref 8–23)
CO2: 30 mmol/L (ref 22–32)
Calcium: 9 mg/dL (ref 8.9–10.3)
Chloride: 104 mmol/L (ref 98–111)
Creatinine: 0.73 mg/dL (ref 0.44–1.00)
GFR, Estimated: >60 mL/min (ref >60)
Glucose, Bld: 102 mg/dL — ABNORMAL HIGH (ref 70–99)
Potassium: 3.5 mmol/L (ref 3.5–5.1)
Sodium: 142 mmol/L (ref 135–145)
Total Bilirubin: 0.6 mg/dL (ref 0.3–1.2)
Total Protein: 6.2 g/dL — ABNORMAL LOW (ref 6.5–8.1)

## 2021-09-24 LAB — CBC WITH DIFFERENTIAL (CANCER CENTER ONLY)
Abs Immature Granulocytes: 0.02 10*3/uL (ref 0.00–0.07)
Basophils Absolute: 0.1 10*3/uL (ref 0.0–0.1)
Basophils Relative: 1 %
Eosinophils Absolute: 0.1 10*3/uL (ref 0.0–0.5)
Eosinophils Relative: 1 %
HCT: 43.9 % (ref 36.0–46.0)
Hemoglobin: 13.7 g/dL (ref 12.0–15.0)
Immature Granulocytes: 0 %
Lymphocytes Relative: 21 %
Lymphs Abs: 1.8 10*3/uL (ref 0.7–4.0)
MCH: 28.3 pg (ref 26.0–34.0)
MCHC: 31.2 g/dL (ref 30.0–36.0)
MCV: 90.7 fL (ref 80.0–100.0)
Monocytes Absolute: 0.6 10*3/uL (ref 0.1–1.0)
Monocytes Relative: 7 %
Neutro Abs: 6.1 10*3/uL (ref 1.7–7.7)
Neutrophils Relative %: 70 %
Platelet Count: 154 10*3/uL (ref 150–400)
RBC: 4.84 MIL/uL (ref 3.87–5.11)
RDW: 12.9 % (ref 11.5–15.5)
WBC Count: 8.6 10*3/uL (ref 4.0–10.5)
nRBC: 0 % (ref 0.0–0.2)

## 2021-09-24 LAB — LACTATE DEHYDROGENASE: LDH: 117 U/L (ref 98–192)

## 2021-09-24 NOTE — Progress Notes (Signed)
[place] OFFICE PROGRESS NOTE   Diagnosis: CLL  INTERVAL HISTORY:   Ms. Ossa returns as scheduled.  She feels well.  She continues ibrutinib.  No bleeding.  No change in baseline arthralgias.  No recent infection.  She has noted swelling at the left ankle for the past few days.  No leg swelling or pain.  The edema improves overnight.  Objective:  Vital signs in last 24 hours:  Blood pressure 116/62, pulse 87, temperature 98.1 F (36.7 C), temperature source Oral, resp. rate 20, height 5' 7"  (1.702 m), weight 185 lb 12.8 oz (84.3 kg), SpO2 98 %.    Lymphatics: No cervical, supraclavicular, axillary, or inguinal nodes Resp: Lungs clear bilaterally Cardio: Regular rate and rhythm GI: No hepatosplenomegaly, no mass, nontender Vascular: No leg edema, possible mild edema of the left ankle   Lab Results:  Lab Results  Component Value Date   WBC 8.6 09/24/2021   HGB 13.7 09/24/2021   HCT 43.9 09/24/2021   MCV 90.7 09/24/2021   PLT 154 09/24/2021   NEUTROABS 6.1 09/24/2021    CMP  Lab Results  Component Value Date   NA 142 09/24/2021   K 3.5 09/24/2021   CL 104 09/24/2021   CO2 30 09/24/2021   GLUCOSE 102 (H) 09/24/2021   BUN 13 09/24/2021   CREATININE 0.73 09/24/2021   CALCIUM 9.0 09/24/2021   PROT 6.2 (L) 09/24/2021   ALBUMIN 4.3 09/24/2021   AST 14 (L) 09/24/2021   ALT 10 09/24/2021   ALKPHOS 48 09/24/2021   BILITOT 0.6 09/24/2021   GFRNONAA >60 09/24/2021   GFRAA >60 06/22/2020    Medications: I have reviewed the patient's current medications.   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 06/20/2016 Monoclonal Kappa restricted B-cell population identified on flow cytometry Staging PET scan 06/13/2016 confirmed hypermetabolic abdominal/pelvic lymphadenopathy FISH panel positive for 11q-,-12,13q-, and 17p-(10% of cells) Peripheral blood flow cytometry 09/22/2016 at [facility]-CD5/CD23  positive For restricted B-cell population, 2% of lymphocytes Peripheral blood IGH mutation analysis at [facility] 09/22/2016-unmutated CT 06/06/2017-generally stable diffuse lymphadenopathy, node in the pelvis is smaller, some the lymph nodes are larger CTs 07/02/2018- slight decrease in size of retrocrural, retroperitoneal, and mesenteric adenopathy, new 6 mm left upper lobe nodule CT chest 01/01/2019- increase in size of left upper lobe lung nodule-inflammatory appearance, other stable lung nodules, right retrocrural and upper abdominal adenopathy has progressed CTs 07/04/2019- progressive lower thoracic and abdominal adenopathy with dominant mass-effect on the stomach and pancreas, new heterogeneity in the spleen- possible splenic lesion, stable lung nodules Ibrutinib starting 08/14/2019 CT abdomen/pelvis 11/19/2019-decreased size of dominant abdominal mass and decreased abdominal lymphadenopathy CTs 06/22/2020-2 upper abdominal lymph nodes have decreased in size, no evidence of disease progression, unchanged bilateral lung nodules   Stage IB (T1b,N0) melanoma of the right forearm, status post a wide excision and sentinel lymph node biopsy at [facility] March 2017     3.    Multiple sclerosis   4.    Diabetes   5.   COPD  6.  T12 compression fracture after a fall May 22, T12 kyphoplasty 05/07/2021   Disposition: Ms. Niznik appears stable.  There is no clinical evidence for progression of CLL.  The CBC is normal.  She will continue ibrutinib.  She will return for an office and lab visit in 4 months.  She will be due for [place] when she returns in 4 months.  She has received an influenza vaccine.  She  plans to obtain the new COVID-19 vaccine.  She will seek medical attention for pain or increased swelling of the left leg.  [name], MD  09/24/2021  11:54 AM

## 2021-09-30 DIAGNOSIS — M6281 Muscle weakness (generalized): Secondary | ICD-10-CM | POA: Diagnosis not present

## 2021-09-30 DIAGNOSIS — R2681 Unsteadiness on feet: Secondary | ICD-10-CM | POA: Diagnosis not present

## 2021-09-30 DIAGNOSIS — M545 Low back pain, unspecified: Secondary | ICD-10-CM | POA: Diagnosis not present

## 2021-09-30 DIAGNOSIS — S22080A Wedge compression fracture of T11-T12 vertebra, initial encounter for closed fracture: Secondary | ICD-10-CM | POA: Diagnosis not present

## 2021-10-04 ENCOUNTER — Other Ambulatory Visit (HOSPITAL_COMMUNITY): Payer: Self-pay

## 2021-10-06 ENCOUNTER — Other Ambulatory Visit (HOSPITAL_COMMUNITY): Payer: Self-pay

## 2021-10-08 ENCOUNTER — Encounter: Payer: Self-pay | Admitting: Oncology

## 2021-10-10 DIAGNOSIS — Z23 Encounter for immunization: Secondary | ICD-10-CM | POA: Diagnosis not present

## 2021-10-14 DIAGNOSIS — Z808 Family history of malignant neoplasm of other organs or systems: Secondary | ICD-10-CM | POA: Diagnosis not present

## 2021-10-14 DIAGNOSIS — M545 Low back pain, unspecified: Secondary | ICD-10-CM | POA: Diagnosis not present

## 2021-10-14 DIAGNOSIS — L590 Erythema ab igne [dermatitis ab igne]: Secondary | ICD-10-CM | POA: Diagnosis not present

## 2021-10-14 DIAGNOSIS — L723 Sebaceous cyst: Secondary | ICD-10-CM | POA: Diagnosis not present

## 2021-10-14 DIAGNOSIS — D225 Melanocytic nevi of trunk: Secondary | ICD-10-CM | POA: Diagnosis not present

## 2021-10-14 DIAGNOSIS — Z85828 Personal history of other malignant neoplasm of skin: Secondary | ICD-10-CM | POA: Diagnosis not present

## 2021-10-14 DIAGNOSIS — R2681 Unsteadiness on feet: Secondary | ICD-10-CM | POA: Diagnosis not present

## 2021-10-14 DIAGNOSIS — M6281 Muscle weakness (generalized): Secondary | ICD-10-CM | POA: Diagnosis not present

## 2021-10-14 DIAGNOSIS — Z85820 Personal history of malignant melanoma of skin: Secondary | ICD-10-CM | POA: Diagnosis not present

## 2021-10-14 DIAGNOSIS — L578 Other skin changes due to chronic exposure to nonionizing radiation: Secondary | ICD-10-CM | POA: Diagnosis not present

## 2021-10-14 DIAGNOSIS — S22080A Wedge compression fracture of T11-T12 vertebra, initial encounter for closed fracture: Secondary | ICD-10-CM | POA: Diagnosis not present

## 2021-10-14 DIAGNOSIS — L821 Other seborrheic keratosis: Secondary | ICD-10-CM | POA: Diagnosis not present

## 2021-10-18 DIAGNOSIS — Z1231 Encounter for screening mammogram for malignant neoplasm of breast: Secondary | ICD-10-CM | POA: Diagnosis not present

## 2021-10-18 LAB — HM MAMMOGRAPHY

## 2021-10-21 ENCOUNTER — Encounter: Payer: Self-pay | Admitting: Family Medicine

## 2021-10-21 DIAGNOSIS — R2681 Unsteadiness on feet: Secondary | ICD-10-CM | POA: Diagnosis not present

## 2021-10-21 DIAGNOSIS — M545 Low back pain, unspecified: Secondary | ICD-10-CM | POA: Diagnosis not present

## 2021-10-21 DIAGNOSIS — S22080A Wedge compression fracture of T11-T12 vertebra, initial encounter for closed fracture: Secondary | ICD-10-CM | POA: Diagnosis not present

## 2021-10-21 DIAGNOSIS — M6281 Muscle weakness (generalized): Secondary | ICD-10-CM | POA: Diagnosis not present

## 2021-10-26 DIAGNOSIS — S22080A Wedge compression fracture of T11-T12 vertebra, initial encounter for closed fracture: Secondary | ICD-10-CM | POA: Diagnosis not present

## 2021-10-26 DIAGNOSIS — M546 Pain in thoracic spine: Secondary | ICD-10-CM | POA: Diagnosis not present

## 2021-10-26 DIAGNOSIS — M6281 Muscle weakness (generalized): Secondary | ICD-10-CM | POA: Diagnosis not present

## 2021-10-26 DIAGNOSIS — R2681 Unsteadiness on feet: Secondary | ICD-10-CM | POA: Diagnosis not present

## 2021-10-26 DIAGNOSIS — M545 Low back pain, unspecified: Secondary | ICD-10-CM | POA: Diagnosis not present

## 2021-10-28 DIAGNOSIS — M6281 Muscle weakness (generalized): Secondary | ICD-10-CM | POA: Diagnosis not present

## 2021-10-28 DIAGNOSIS — S22080A Wedge compression fracture of T11-T12 vertebra, initial encounter for closed fracture: Secondary | ICD-10-CM | POA: Diagnosis not present

## 2021-10-28 DIAGNOSIS — R2681 Unsteadiness on feet: Secondary | ICD-10-CM | POA: Diagnosis not present

## 2021-10-28 DIAGNOSIS — M545 Low back pain, unspecified: Secondary | ICD-10-CM | POA: Diagnosis not present

## 2021-10-31 DIAGNOSIS — Z20822 Contact with and (suspected) exposure to covid-19: Secondary | ICD-10-CM | POA: Diagnosis not present

## 2021-11-01 ENCOUNTER — Other Ambulatory Visit (HOSPITAL_COMMUNITY): Payer: Self-pay

## 2021-11-01 ENCOUNTER — Other Ambulatory Visit: Payer: Self-pay

## 2021-11-01 ENCOUNTER — Other Ambulatory Visit: Payer: Self-pay | Admitting: Family Medicine

## 2021-11-01 DIAGNOSIS — M545 Low back pain, unspecified: Secondary | ICD-10-CM | POA: Diagnosis not present

## 2021-11-01 DIAGNOSIS — M6281 Muscle weakness (generalized): Secondary | ICD-10-CM | POA: Diagnosis not present

## 2021-11-01 DIAGNOSIS — S22080A Wedge compression fracture of T11-T12 vertebra, initial encounter for closed fracture: Secondary | ICD-10-CM | POA: Diagnosis not present

## 2021-11-01 DIAGNOSIS — R2681 Unsteadiness on feet: Secondary | ICD-10-CM | POA: Diagnosis not present

## 2021-11-02 ENCOUNTER — Other Ambulatory Visit (HOSPITAL_COMMUNITY): Payer: Self-pay

## 2021-11-03 ENCOUNTER — Other Ambulatory Visit (HOSPITAL_COMMUNITY): Payer: Self-pay

## 2021-11-04 DIAGNOSIS — R2681 Unsteadiness on feet: Secondary | ICD-10-CM | POA: Diagnosis not present

## 2021-11-04 DIAGNOSIS — S22080A Wedge compression fracture of T11-T12 vertebra, initial encounter for closed fracture: Secondary | ICD-10-CM | POA: Diagnosis not present

## 2021-11-04 DIAGNOSIS — M6281 Muscle weakness (generalized): Secondary | ICD-10-CM | POA: Diagnosis not present

## 2021-11-04 DIAGNOSIS — M545 Low back pain, unspecified: Secondary | ICD-10-CM | POA: Diagnosis not present

## 2021-11-09 DIAGNOSIS — S22080A Wedge compression fracture of T11-T12 vertebra, initial encounter for closed fracture: Secondary | ICD-10-CM | POA: Diagnosis not present

## 2021-11-09 DIAGNOSIS — R2681 Unsteadiness on feet: Secondary | ICD-10-CM | POA: Diagnosis not present

## 2021-11-09 DIAGNOSIS — M545 Low back pain, unspecified: Secondary | ICD-10-CM | POA: Diagnosis not present

## 2021-11-09 DIAGNOSIS — M6281 Muscle weakness (generalized): Secondary | ICD-10-CM | POA: Diagnosis not present

## 2021-11-11 DIAGNOSIS — R2681 Unsteadiness on feet: Secondary | ICD-10-CM | POA: Diagnosis not present

## 2021-11-11 DIAGNOSIS — S22080A Wedge compression fracture of T11-T12 vertebra, initial encounter for closed fracture: Secondary | ICD-10-CM | POA: Diagnosis not present

## 2021-11-11 DIAGNOSIS — M545 Low back pain, unspecified: Secondary | ICD-10-CM | POA: Diagnosis not present

## 2021-11-11 DIAGNOSIS — M6281 Muscle weakness (generalized): Secondary | ICD-10-CM | POA: Diagnosis not present

## 2021-11-16 ENCOUNTER — Ambulatory Visit: Payer: Medicare Other | Admitting: Podiatry

## 2021-11-17 ENCOUNTER — Encounter: Payer: Self-pay | Admitting: Podiatry

## 2021-11-17 ENCOUNTER — Ambulatory Visit (INDEPENDENT_AMBULATORY_CARE_PROVIDER_SITE_OTHER): Payer: Medicare Other | Admitting: Family Medicine

## 2021-11-17 ENCOUNTER — Other Ambulatory Visit: Payer: Self-pay

## 2021-11-17 ENCOUNTER — Encounter: Payer: Self-pay | Admitting: Family Medicine

## 2021-11-17 ENCOUNTER — Ambulatory Visit (INDEPENDENT_AMBULATORY_CARE_PROVIDER_SITE_OTHER): Payer: Medicare Other | Admitting: Podiatry

## 2021-11-17 VITALS — BP 136/72 | HR 70 | Temp 98.6°F | Wt 188.0 lb

## 2021-11-17 DIAGNOSIS — L600 Ingrowing nail: Secondary | ICD-10-CM | POA: Diagnosis not present

## 2021-11-17 DIAGNOSIS — R809 Proteinuria, unspecified: Secondary | ICD-10-CM | POA: Diagnosis not present

## 2021-11-17 DIAGNOSIS — E119 Type 2 diabetes mellitus without complications: Secondary | ICD-10-CM | POA: Diagnosis not present

## 2021-11-17 DIAGNOSIS — S22020S Wedge compression fracture of second thoracic vertebra, sequela: Secondary | ICD-10-CM | POA: Diagnosis not present

## 2021-11-17 DIAGNOSIS — R319 Hematuria, unspecified: Secondary | ICD-10-CM

## 2021-11-17 DIAGNOSIS — S22020D Wedge compression fracture of second thoracic vertebra, subsequent encounter for fracture with routine healing: Secondary | ICD-10-CM

## 2021-11-17 MED ORDER — DOXYCYCLINE HYCLATE 100 MG PO TABS: 100.0000 mg | ORAL_TABLET | Freq: Two times a day (BID) | ORAL | 0 refills | Status: AC

## 2021-11-17 NOTE — Progress Notes (Signed)
° °  Subjective:    Patient ID: Rachel Coffey, female    DOB: 1950/12/31, 71 y.o.   MRN: [id number]  HPI Here to follow up. She feels great in general and her back pain is improving day by day. She is taking PT and this has been helpful. Her BP is stable;. He last A1c in August was 6.3. She says she has a hx of having some protein in her urine and she asks [place] to check this again. Her creatinine was 0.73 in November at Dr. Gearldine Shown office.    Review of Systems  Constitutional: Negative.   Respiratory: Negative.    Cardiovascular: Negative.       Objective:   Physical Exam Constitutional:      Appearance: Normal appearance.  Cardiovascular:     Rate and Rhythm: Normal rate and regular rhythm.     Pulses: Normal pulses.     Heart sounds: Normal heart sounds.  Pulmonary:     Effort: Pulmonary effort is normal.     Breath sounds: Normal breath sounds.  Musculoskeletal:     Right lower leg: No edema.     Left lower leg: No edema.  Neurological:     Mental Status: She is alert.          Assessment & Plan:  Her HTN is stable. She is recovering from a vertebral compression fracture. We will set her up for an early morning UA and A1c sometime soon. It sounds like her diabetes is stable.  Alysia Penna, MD

## 2021-11-17 NOTE — Progress Notes (Signed)
Subjective:  Patient ID: Rachel Coffey, female    DOB: [birth year],  MRN: [id number]  Chief Complaint  Patient presents with   Ingrown Toenail    Left hallux     71 y.o. female presents with the above complaint.  Patient presents with complaint left hallux medial border ingrown.  Patient states painful to touch.  There is some redness associate with it.  She would like to have removed.  She states that she has not had an ingrown motor on the side.  She denies any other acute complaints.  She is tried some self debridement none of which has helped.  She has not seen anyone as prior to seeing me for this   Review of Systems: Negative except as noted in the HPI. Denies N/V/F/Ch.  Past Medical History:  Diagnosis Date   Asthma    stress induced with allergies   Cancer ([place])    CLL (chronic lymphocytic leukemia) ([place])    sees Dr. Benay Spice   Compression fracture of thoracic vertebra ([place])    at T12, sees Dr. Kary Kos   Genital warts    Hyperlipidemia    MS (multiple sclerosis) [facility])    sees Dr. Otto Herb of [facility] Neurology at the [facility] office   Type 2 diabetes mellitus [facility])     Current Outpatient Medications:    doxycycline (VIBRA-TABS) 100 MG tablet, Take 1 tablet (100 mg total) by mouth 2 (two) times daily for 10 days., Disp: 20 tablet, Rfl: 0   acetaminophen (TYLENOL) 500 MG tablet, Take 500 mg by mouth as needed., Disp: , Rfl:    albuterol (PROVENTIL HFA;VENTOLIN HFA) 108 (90 BASE) MCG/ACT inhaler, Inhale 2 puffs into the lungs every 6 (six) hours as needed for wheezing., Disp: , Rfl:    ALPRAZolam (XANAX) 0.5 MG tablet, Take 0.5 mg by mouth daily as needed (for pain). , Disp: , Rfl:    baclofen (LIORESAL) 10 MG tablet, Take 5 mg by mouth 3 (three) times daily as needed., Disp: , Rfl:    Cholecalciferol (VITAMIN D) 50 MCG (2000 UT) CAPS, Take 2,000 Units by mouth daily. , Disp: , Rfl:    diclofenac Sodium (VOLTAREN) 1 % GEL, Apply 1 application topically 4 (four) times  daily as needed (pain)., Disp: , Rfl:    esomeprazole (NEXIUM) 20 MG capsule, Take 20 mg by mouth daily., Disp: , Rfl:    fluticasone (FLONASE) 50 MCG/ACT nasal spray, Place 1 spray into both nostrils daily as needed for allergies. Sometimes bid, Disp: , Rfl:    gabapentin (NEURONTIN) 100 MG capsule, TAKE ONE CAPSULE BY MOUTH EVERY NIGHT AT BEDTIME, Disp: 90 capsule, Rfl: 0   glipiZIDE (GLUCOTROL XL) 2.5 MG 24 hr tablet, Take 1 tablet (2.5 mg total) by mouth 2 (two) times daily., Disp: 180 tablet, Rfl: 3   ibrutinib (IMBRUVICA) 420 MG TABS, TAKE 1 TABLET (420 MG) BY MOUTH DAILY. ADMINISTER WITH WATER AT APPROXIMATELY THE SAME TIME EACH DAY., Disp: 28 tablet, Rfl: 3   loratadine (CLARITIN) 10 MG tablet, Take 10 mg by mouth daily as needed (allergies)., Disp: , Rfl:    losartan (COZAAR) 25 MG tablet, Take 1 tablet (25 mg total) by mouth daily., Disp: 90 tablet, Rfl: 0   meloxicam (MOBIC) 15 MG tablet, TAKE ONE (1) TABLET BY MOUTH EVERY DAY AS NEEDED FOR KNEE PAIN, Disp: 30 tablet, Rfl: 0   simvastatin (ZOCOR) 20 MG tablet, Take 1 tablet (20 mg total) by mouth at bedtime., Disp: 90 tablet,  Rfl: 0  Social History   Tobacco Use  Smoking Status Former   Types: Cigarettes   Quit date: 01/31/2005   Years since quitting: 16.8  Smokeless Tobacco Never    Allergies  Allergen Reactions   Amoxicillin-Pot Clavulanate Other (See Comments)    Pt preferred - causes upset stomach    Codeine Itching   Fenofibrate     aching joints   Hydrocodone Itching   Hydrocodone-Acetaminophen Itching   Lisinopril Cough   Metformin And Related     Makes pt deathly ill   Objective:  There were no vitals filed for this visit. There is no height or weight on file to calculate BMI. Constitutional Well developed. Well nourished.  Vascular Dorsalis pedis pulses palpable bilaterally. Posterior tibial pulses palpable bilaterally. Capillary refill normal to all digits.  No cyanosis or clubbing noted. Pedal hair  growth normal.  Neurologic Normal speech. Oriented to person, place, and time. Epicritic sensation to light touch grossly present bilaterally.  Dermatologic Painful ingrowing nail at medial nail borders of the hallux nail left. No other open wounds. No skin lesions.  Orthopedic: Normal joint ROM without pain or crepitus bilaterally. No visible deformities. No bony tenderness.   Radiographs: None Assessment:   1. Ingrown left greater toenail    Plan:  Patient was evaluated and treated and all questions answered.  Ingrown Nail, left -Patient elects to proceed with minor surgery to remove ingrown toenail removal today. Consent reviewed and signed by patient. -Ingrown nail excised. See procedure note. -Educated on post-procedure care including soaking. Written instructions provided and reviewed. -Patient to follow up in 2 weeks for nail check.  Procedure: Excision of Ingrown Toenail Location: Left 1st toe medial nail borders. Anesthesia: Lidocaine 1% plain; 1.5 mL and Marcaine 0.5% plain; 1.5 mL, digital block. Skin Prep: Betadine. Dressing: Silvadene; telfa; dry, sterile, compression dressing. Technique: Following skin prep, the toe was exsanguinated and a tourniquet was secured at the base of the toe. The affected nail border was freed, split with a nail splitter, and excised. Chemical matrixectomy was then performed with phenol and irrigated out with alcohol. The tourniquet was then removed and sterile dressing applied. Disposition: Patient tolerated procedure well. Patient to return in 2 weeks for follow-up.   No follow-ups on file.

## 2021-11-18 DIAGNOSIS — M545 Low back pain, unspecified: Secondary | ICD-10-CM | POA: Diagnosis not present

## 2021-11-18 DIAGNOSIS — S22080A Wedge compression fracture of T11-T12 vertebra, initial encounter for closed fracture: Secondary | ICD-10-CM | POA: Diagnosis not present

## 2021-11-18 DIAGNOSIS — M6281 Muscle weakness (generalized): Secondary | ICD-10-CM | POA: Diagnosis not present

## 2021-11-18 DIAGNOSIS — R2681 Unsteadiness on feet: Secondary | ICD-10-CM | POA: Diagnosis not present

## 2021-11-22 DIAGNOSIS — S22080A Wedge compression fracture of T11-T12 vertebra, initial encounter for closed fracture: Secondary | ICD-10-CM | POA: Diagnosis not present

## 2021-11-22 DIAGNOSIS — M545 Low back pain, unspecified: Secondary | ICD-10-CM | POA: Diagnosis not present

## 2021-11-22 DIAGNOSIS — R2681 Unsteadiness on feet: Secondary | ICD-10-CM | POA: Diagnosis not present

## 2021-11-22 DIAGNOSIS — M6281 Muscle weakness (generalized): Secondary | ICD-10-CM | POA: Diagnosis not present

## 2021-11-23 ENCOUNTER — Ambulatory Visit: Payer: Medicare Other | Admitting: Podiatry

## 2021-11-23 ENCOUNTER — Other Ambulatory Visit (INDEPENDENT_AMBULATORY_CARE_PROVIDER_SITE_OTHER): Payer: Medicare Other

## 2021-11-23 DIAGNOSIS — E119 Type 2 diabetes mellitus without complications: Secondary | ICD-10-CM

## 2021-11-23 LAB — URINALYSIS, ROUTINE W REFLEX MICROSCOPIC
Bilirubin Urine: NEGATIVE
Ketones, ur: NEGATIVE
Leukocytes,Ua: NEGATIVE
Nitrite: NEGATIVE
Specific Gravity, Urine: >=1.03 — AB (ref 1.000–1.030)
Total Protein, Urine: NEGATIVE
Urine Glucose: NEGATIVE
Urobilinogen, UA: 0.2 (ref 0.0–1.0)
pH: 5.5 (ref 5.0–8.0)

## 2021-11-23 LAB — HEMOGLOBIN A1C: Hgb A1c MFr Bld: 6.3 % (ref 4.6–6.5)

## 2021-11-23 NOTE — Addendum Note (Signed)
Addended by: Alysia Penna A on: 11/23/2021 01:43 PM   Modules accepted: Orders

## 2021-11-24 ENCOUNTER — Encounter: Payer: Self-pay | Admitting: Family Medicine

## 2021-11-24 NOTE — Telephone Encounter (Signed)
Yes when we find blood in someone's urine and there is no infection to account for it, we need to rule out other possible causes (anything from kidney stones to broken blood vessels to cancer). We refer to Urology to work this out. It often involves a CT scan of the kidneys and a cystoscopy (where they insert a tiny tube with a camera on it) into the bladder. The Urology office will contact her in the next few weeks.

## 2021-11-25 ENCOUNTER — Encounter: Payer: Self-pay | Admitting: Podiatry

## 2021-11-25 ENCOUNTER — Telehealth: Payer: Self-pay | Admitting: Family Medicine

## 2021-11-25 NOTE — Telephone Encounter (Signed)
Pt call and stated she want a call back and will be in therapy from 12:30 to 1:30 and want have her ph.

## 2021-11-25 NOTE — Telephone Encounter (Signed)
Communication with patient through my chart message that was sent into office.

## 2021-11-26 ENCOUNTER — Other Ambulatory Visit (HOSPITAL_COMMUNITY): Payer: Self-pay

## 2021-11-26 ENCOUNTER — Other Ambulatory Visit: Payer: Self-pay | Admitting: Podiatry

## 2021-11-26 MED ORDER — SULFAMETHOXAZOLE-TRIMETHOPRIM 800-160 MG PO TABS: 1.0000 | ORAL_TABLET | Freq: Two times a day (BID) | ORAL | 0 refills | Status: DC

## 2021-11-26 NOTE — Progress Notes (Unsigned)
bac

## 2021-11-29 DIAGNOSIS — S22080A Wedge compression fracture of T11-T12 vertebra, initial encounter for closed fracture: Secondary | ICD-10-CM | POA: Diagnosis not present

## 2021-11-29 DIAGNOSIS — M545 Low back pain, unspecified: Secondary | ICD-10-CM | POA: Diagnosis not present

## 2021-11-29 DIAGNOSIS — M6281 Muscle weakness (generalized): Secondary | ICD-10-CM | POA: Diagnosis not present

## 2021-11-29 DIAGNOSIS — R2681 Unsteadiness on feet: Secondary | ICD-10-CM | POA: Diagnosis not present

## 2021-12-01 ENCOUNTER — Other Ambulatory Visit (HOSPITAL_COMMUNITY): Payer: Self-pay

## 2021-12-02 DIAGNOSIS — M6281 Muscle weakness (generalized): Secondary | ICD-10-CM | POA: Diagnosis not present

## 2021-12-02 DIAGNOSIS — S22080A Wedge compression fracture of T11-T12 vertebra, initial encounter for closed fracture: Secondary | ICD-10-CM | POA: Diagnosis not present

## 2021-12-02 DIAGNOSIS — M545 Low back pain, unspecified: Secondary | ICD-10-CM | POA: Diagnosis not present

## 2021-12-02 DIAGNOSIS — R2681 Unsteadiness on feet: Secondary | ICD-10-CM | POA: Diagnosis not present

## 2021-12-06 DIAGNOSIS — M6281 Muscle weakness (generalized): Secondary | ICD-10-CM | POA: Diagnosis not present

## 2021-12-06 DIAGNOSIS — S22080A Wedge compression fracture of T11-T12 vertebra, initial encounter for closed fracture: Secondary | ICD-10-CM | POA: Diagnosis not present

## 2021-12-06 DIAGNOSIS — M545 Low back pain, unspecified: Secondary | ICD-10-CM | POA: Diagnosis not present

## 2021-12-06 DIAGNOSIS — R2681 Unsteadiness on feet: Secondary | ICD-10-CM | POA: Diagnosis not present

## 2021-12-08 DIAGNOSIS — R2681 Unsteadiness on feet: Secondary | ICD-10-CM | POA: Diagnosis not present

## 2021-12-08 DIAGNOSIS — M545 Low back pain, unspecified: Secondary | ICD-10-CM | POA: Diagnosis not present

## 2021-12-08 DIAGNOSIS — S22080A Wedge compression fracture of T11-T12 vertebra, initial encounter for closed fracture: Secondary | ICD-10-CM | POA: Diagnosis not present

## 2021-12-08 DIAGNOSIS — M6281 Muscle weakness (generalized): Secondary | ICD-10-CM | POA: Diagnosis not present

## 2021-12-10 ENCOUNTER — Telehealth: Payer: Self-pay

## 2021-12-10 NOTE — Telephone Encounter (Signed)
Pt came to office to leave message informing Dr. Benay Spice that she had some blood in her urine and was referred to a Urologist. Pt stated that if a CT Scan was ordered she would like to know if Dr Benay Spice would want to get a CT of her chest as well because she hasn't had one in a while. Informed Pt that I will pass this information over to Dr. Benay Spice. Pt verbalized understanding. No further problems or concerns noted.

## 2021-12-13 ENCOUNTER — Other Ambulatory Visit: Payer: Self-pay | Admitting: Family Medicine

## 2021-12-13 DIAGNOSIS — M545 Low back pain, unspecified: Secondary | ICD-10-CM | POA: Diagnosis not present

## 2021-12-13 DIAGNOSIS — M6281 Muscle weakness (generalized): Secondary | ICD-10-CM | POA: Diagnosis not present

## 2021-12-13 DIAGNOSIS — R2681 Unsteadiness on feet: Secondary | ICD-10-CM | POA: Diagnosis not present

## 2021-12-13 DIAGNOSIS — S22080A Wedge compression fracture of T11-T12 vertebra, initial encounter for closed fracture: Secondary | ICD-10-CM | POA: Diagnosis not present

## 2021-12-20 DIAGNOSIS — M545 Low back pain, unspecified: Secondary | ICD-10-CM | POA: Diagnosis not present

## 2021-12-20 DIAGNOSIS — S22080A Wedge compression fracture of T11-T12 vertebra, initial encounter for closed fracture: Secondary | ICD-10-CM | POA: Diagnosis not present

## 2021-12-20 DIAGNOSIS — M6281 Muscle weakness (generalized): Secondary | ICD-10-CM | POA: Diagnosis not present

## 2021-12-20 DIAGNOSIS — R2681 Unsteadiness on feet: Secondary | ICD-10-CM | POA: Diagnosis not present

## 2021-12-22 DIAGNOSIS — M545 Low back pain, unspecified: Secondary | ICD-10-CM | POA: Diagnosis not present

## 2021-12-22 DIAGNOSIS — S22080A Wedge compression fracture of T11-T12 vertebra, initial encounter for closed fracture: Secondary | ICD-10-CM | POA: Diagnosis not present

## 2021-12-22 DIAGNOSIS — M6281 Muscle weakness (generalized): Secondary | ICD-10-CM | POA: Diagnosis not present

## 2021-12-22 DIAGNOSIS — R2681 Unsteadiness on feet: Secondary | ICD-10-CM | POA: Diagnosis not present

## 2021-12-23 ENCOUNTER — Other Ambulatory Visit (HOSPITAL_COMMUNITY): Payer: Self-pay

## 2021-12-27 ENCOUNTER — Other Ambulatory Visit: Payer: Self-pay | Admitting: Family Medicine

## 2021-12-27 ENCOUNTER — Other Ambulatory Visit (HOSPITAL_COMMUNITY): Payer: Self-pay

## 2021-12-27 ENCOUNTER — Other Ambulatory Visit: Payer: Self-pay | Admitting: Oncology

## 2021-12-27 DIAGNOSIS — C9110 Chronic lymphocytic leukemia of B-cell type not having achieved remission: Secondary | ICD-10-CM

## 2021-12-27 MED FILL — Ibrutinib Tab 420 MG: ORAL | 28 days supply | Qty: 28 | Fill #0 | Status: AC

## 2021-12-29 ENCOUNTER — Telehealth: Payer: Self-pay | Admitting: Family Medicine

## 2021-12-29 ENCOUNTER — Other Ambulatory Visit: Payer: Self-pay | Admitting: Family Medicine

## 2021-12-29 ENCOUNTER — Other Ambulatory Visit (HOSPITAL_COMMUNITY): Payer: Self-pay

## 2021-12-29 DIAGNOSIS — R3121 Asymptomatic microscopic hematuria: Secondary | ICD-10-CM | POA: Diagnosis not present

## 2021-12-29 NOTE — Telephone Encounter (Signed)
Disregard pt has refills on losaratan

## 2021-12-30 DIAGNOSIS — M6281 Muscle weakness (generalized): Secondary | ICD-10-CM | POA: Diagnosis not present

## 2021-12-30 DIAGNOSIS — S22080A Wedge compression fracture of T11-T12 vertebra, initial encounter for closed fracture: Secondary | ICD-10-CM | POA: Diagnosis not present

## 2021-12-30 DIAGNOSIS — M545 Low back pain, unspecified: Secondary | ICD-10-CM | POA: Diagnosis not present

## 2021-12-30 DIAGNOSIS — R2681 Unsteadiness on feet: Secondary | ICD-10-CM | POA: Diagnosis not present

## 2022-01-03 DIAGNOSIS — S22080A Wedge compression fracture of T11-T12 vertebra, initial encounter for closed fracture: Secondary | ICD-10-CM | POA: Diagnosis not present

## 2022-01-03 DIAGNOSIS — M545 Low back pain, unspecified: Secondary | ICD-10-CM | POA: Diagnosis not present

## 2022-01-03 DIAGNOSIS — R2681 Unsteadiness on feet: Secondary | ICD-10-CM | POA: Diagnosis not present

## 2022-01-03 DIAGNOSIS — M6281 Muscle weakness (generalized): Secondary | ICD-10-CM | POA: Diagnosis not present

## 2022-01-05 DIAGNOSIS — R2681 Unsteadiness on feet: Secondary | ICD-10-CM | POA: Diagnosis not present

## 2022-01-05 DIAGNOSIS — S22080A Wedge compression fracture of T11-T12 vertebra, initial encounter for closed fracture: Secondary | ICD-10-CM | POA: Diagnosis not present

## 2022-01-05 DIAGNOSIS — M545 Low back pain, unspecified: Secondary | ICD-10-CM | POA: Diagnosis not present

## 2022-01-05 DIAGNOSIS — M6281 Muscle weakness (generalized): Secondary | ICD-10-CM | POA: Diagnosis not present

## 2022-01-06 ENCOUNTER — Encounter: Payer: Self-pay | Admitting: Adult Health

## 2022-01-06 ENCOUNTER — Other Ambulatory Visit (HOSPITAL_COMMUNITY): Payer: Self-pay

## 2022-01-06 ENCOUNTER — Ambulatory Visit (INDEPENDENT_AMBULATORY_CARE_PROVIDER_SITE_OTHER): Payer: Medicare Other | Admitting: Adult Health

## 2022-01-06 VITALS — BP 120/58 | HR 75 | Temp 98.5°F | Ht 67.0 in | Wt 189.0 lb

## 2022-01-06 DIAGNOSIS — B379 Candidiasis, unspecified: Secondary | ICD-10-CM

## 2022-01-06 MED ORDER — KETOCONAZOLE 2 % EX SHAM: 1.0000 "application " | MEDICATED_SHAMPOO | CUTANEOUS | 0 refills | Status: DC

## 2022-01-06 MED ORDER — TERBINAFINE HCL 250 MG PO TABS: 250.0000 mg | ORAL_TABLET | Freq: Every day | ORAL | 0 refills | Status: DC

## 2022-01-06 NOTE — Progress Notes (Signed)
Subjective:    Patient ID: Rachel Coffey, female    DOB: 06/30/1951, 71 y.o.   MRN: [id number]  HPI 71 year old female who  has a past medical history of Asthma, Cancer ([place]), CLL (chronic lymphocytic leukemia) ([place]), Compression fracture of thoracic vertebra ([place]), Genital warts, Hyperlipidemia, MS (multiple sclerosis) ([place]), and Type 2 diabetes mellitus ([place]).  She presents to the office today for an acute issue of rash under her right breast.  Rash has been present for roughly 1 week.  She denies itching but does have some mild discomfort especially after a shower.  Her rash is red and flat.  She does report having a couple yeast infections throughout her body over the last 6 months.  In the past she has used topical antifungal creams and has used it on this rash but has gotten larger in size.  No longer having any drainage   Review of Systems See HPI   Past Medical History:  Diagnosis Date   Asthma    stress induced with allergies   Cancer ([place])    CLL (chronic lymphocytic leukemia) ([place])    sees Dr. Benay Spice   Compression fracture of thoracic vertebra ([place])    at T12, sees Dr. Kary Kos   Genital warts    Hyperlipidemia    MS (multiple sclerosis) ([place])    sees Dr. Otto Herb of [facility] Neurology at the [facility] office   Type 2 diabetes mellitus [facility])     Social History   Socioeconomic History   Marital status: Married    Spouse name: Not on file   Number of children: Not on file   Years of education: Not on file   Highest education level: Not on file  Occupational History   Not on file  Tobacco Use   Smoking status: Former    Types: Cigarettes    Quit date: 01/31/2005    Years since quitting: 16.9   Smokeless tobacco: Never  Vaping Use   Vaping Use: Never used  Substance and Sexual Activity   Alcohol use: No   Drug use: No   Sexual activity: Never    Partners: Male    Comment: [facility]--still has 1 ovary  Other Topics Concern   Not on file  Social History  Narrative   Not on file   Social Determinants of Health   Financial Resource Strain: Not on file  Food Insecurity: Not on file  Transportation Needs: Not on file  Physical Activity: Not on file  Stress: Not on file  Social Connections: Not on file  Intimate Partner Violence: Not on file    Past Surgical History:  Procedure Laterality Date   ABDOMINAL HYSTERECTOMY     with 1 ovary removed-[facility]   CHOLECYSTECTOMY N/A 02/25/2013   Procedure: LAPAROSCOPIC CHOLECYSTECTOMY WITH INTRAOPERATIVE CHOLANGIOGRAM;  Surgeon: Earnstine Regal, MD;  Location: [facility] ORS;  Service: General;  Laterality: N/A;   COLONOSCOPY  02/19/2013   per Dr. Teena Irani, clear, repeat in 10 yrs   IR KYPHO THORACIC WITH BONE BIOPSY  05/07/2021   TONSILLECTOMY      Family History  Problem Relation Age of Onset   COPD Mother    Heart disease Mother    Diabetes Father    Heart disease Sister     Allergies  Allergen Reactions   Amoxicillin-Pot Clavulanate Other (See Comments)    Pt preferred - causes upset stomach    Codeine Itching   Fenofibrate     aching  joints   Hydrocodone Itching   Hydrocodone-Acetaminophen Itching   Lisinopril Cough   Metformin And Related     Makes pt deathly ill    Current Outpatient Medications on File Prior to Visit  Medication Sig Dispense Refill   acetaminophen (TYLENOL) 500 MG tablet Take 500 mg by mouth as needed.     albuterol (PROVENTIL HFA;VENTOLIN HFA) 108 (90 BASE) MCG/ACT inhaler Inhale 2 puffs into the lungs every 6 (six) hours as needed for wheezing.     ALPRAZolam (XANAX) 0.5 MG tablet Take 0.5 mg by mouth daily as needed (for pain).      baclofen (LIORESAL) 10 MG tablet Take 5 mg by mouth 3 (three) times daily as needed.     Cholecalciferol (VITAMIN D) 50 MCG (2000 UT) CAPS Take 2,000 Units by mouth daily.      diclofenac Sodium (VOLTAREN) 1 % GEL Apply 1 application topically 4 (four) times daily as needed (pain).     esomeprazole (NEXIUM) 20 MG capsule Take 20 mg  by mouth daily.     fluticasone (FLONASE) 50 MCG/ACT nasal spray Place 1 spray into both nostrils daily as needed for allergies. Sometimes bid     gabapentin (NEURONTIN) 100 MG capsule TAKE ONE CAPSULE BY MOUTH EVERY NIGHT AT BEDTIME 90 capsule 0   glipiZIDE (GLUCOTROL XL) 2.5 MG 24 hr tablet Take 1 tablet (2.5 mg total) by mouth 2 (two) times daily. 180 tablet 3   ibrutinib (IMBRUVICA) 420 MG tablet TAKE 1 TABLET (420 MG) BY MOUTH DAILY. ADMINISTER WITH WATER AT APPROXIMATELY THE SAME TIME EACH DAY. 28 tablet 3   loratadine (CLARITIN) 10 MG tablet Take 10 mg by mouth daily as needed (allergies).     losartan (COZAAR) 25 MG tablet TAKE ONE TABLET (25MG  TOTAL) BY MOUTH DAILY 90 tablet 0   meloxicam (MOBIC) 15 MG tablet TAKE ONE (1) TABLET BY MOUTH EVERY DAY AS NEEDED FOR KNEE PAIN 30 tablet 0   simvastatin (ZOCOR) 20 MG tablet TAKE ONE TABLET (20MG  TOTAL) BY MOUTH ATBEDTIME 90 tablet 0   sulfamethoxazole-trimethoprim (BACTRIM DS) 800-160 MG tablet Take 1 tablet by mouth 2 (two) times daily. 10 tablet 0   No current facility-administered medications on file prior to visit.    BP (!) 120/58    Pulse 75    Temp 98.5 F (36.9 C) (Oral)    Ht 5\' 7"  (1.702 m)    Wt 189 lb (85.7 kg)    SpO2 96%    BMI 29.60 kg/m       Objective:   Physical Exam Vitals and nursing note reviewed.  Constitutional:      Appearance: Normal appearance.  Skin:    General: Skin is warm and dry.     Findings: Erythema and rash present.     Comments: Baseball sized circular/flat/red rash noted under right breast.  No active drainage noted  Neurological:     General: No focal deficit present.     Mental Status: She is alert and oriented to person, place, and time.  Psychiatric:        Mood and Affect: Mood normal.        Behavior: Behavior normal.        Thought Content: Thought content normal.        Judgment: Judgment normal.      Assessment & Plan:  1. Candida infection -Diflucan had a strong interaction  with Imbruvica.  Lamisil has no known interactions.  We will have her stop  her statin while taking Lamisil.  Also advised to use zinc ointment and can use some old cotton T-shirts under her breast at night to help keep area dry.  Follow-up if not improving in the next week or sooner if symptoms worsen - terbinafine (LAMISIL) 250 MG tablet; Take 1 tablet (250 mg total) by mouth daily for 14 days.  Dispense: 14 tablet; Refill: 0 - ketoconazole (NIZORAL) 2 % shampoo; Apply 1 application topically 2 (two) times a week.  Dispense: 120 mL; Refill: 0  Dorothyann Peng, NP

## 2022-01-06 NOTE — Patient Instructions (Signed)
It was great meeting you today   I have sent in an oral antifungal called Terbinafine ( Lamisil), take this daily x 14 days   I have also sent in Nizoral shampoo for you to use as a body wash - once or twice a week   You can apply a zinc ointment to the rash as well

## 2022-01-10 DIAGNOSIS — S22080A Wedge compression fracture of T11-T12 vertebra, initial encounter for closed fracture: Secondary | ICD-10-CM | POA: Diagnosis not present

## 2022-01-10 DIAGNOSIS — R2681 Unsteadiness on feet: Secondary | ICD-10-CM | POA: Diagnosis not present

## 2022-01-10 DIAGNOSIS — M6281 Muscle weakness (generalized): Secondary | ICD-10-CM | POA: Diagnosis not present

## 2022-01-10 DIAGNOSIS — M545 Low back pain, unspecified: Secondary | ICD-10-CM | POA: Diagnosis not present

## 2022-01-12 DIAGNOSIS — R2681 Unsteadiness on feet: Secondary | ICD-10-CM | POA: Diagnosis not present

## 2022-01-12 DIAGNOSIS — M545 Low back pain, unspecified: Secondary | ICD-10-CM | POA: Diagnosis not present

## 2022-01-12 DIAGNOSIS — S22080A Wedge compression fracture of T11-T12 vertebra, initial encounter for closed fracture: Secondary | ICD-10-CM | POA: Diagnosis not present

## 2022-01-12 DIAGNOSIS — M6281 Muscle weakness (generalized): Secondary | ICD-10-CM | POA: Diagnosis not present

## 2022-01-18 DIAGNOSIS — M545 Low back pain, unspecified: Secondary | ICD-10-CM | POA: Diagnosis not present

## 2022-01-18 DIAGNOSIS — S22080A Wedge compression fracture of T11-T12 vertebra, initial encounter for closed fracture: Secondary | ICD-10-CM | POA: Diagnosis not present

## 2022-01-18 DIAGNOSIS — R2681 Unsteadiness on feet: Secondary | ICD-10-CM | POA: Diagnosis not present

## 2022-01-18 DIAGNOSIS — M6281 Muscle weakness (generalized): Secondary | ICD-10-CM | POA: Diagnosis not present

## 2022-01-19 ENCOUNTER — Other Ambulatory Visit (HOSPITAL_COMMUNITY): Payer: Self-pay

## 2022-01-19 ENCOUNTER — Ambulatory Visit (INDEPENDENT_AMBULATORY_CARE_PROVIDER_SITE_OTHER): Payer: Medicare Other | Admitting: Family Medicine

## 2022-01-19 ENCOUNTER — Encounter: Payer: Self-pay | Admitting: Family Medicine

## 2022-01-19 VITALS — BP 130/60 | HR 76 | Temp 98.4°F | Wt 189.0 lb

## 2022-01-19 DIAGNOSIS — S91115A Laceration without foreign body of left lesser toe(s) without damage to nail, initial encounter: Secondary | ICD-10-CM | POA: Diagnosis not present

## 2022-01-19 MED ORDER — CEPHALEXIN 500 MG PO CAPS: 500.0000 mg | ORAL_CAPSULE | Freq: Three times a day (TID) | ORAL | 0 refills | Status: AC

## 2022-01-19 NOTE — Progress Notes (Signed)
? ?  Subjective:  ? ? Patient ID: Rachel Coffey, female    DOB: 1951/07/17, 71 y.o.   MRN: [id number] ? ?HPI ?Here for an injury to the left 2nd toe which occurred at home 8 days ago. While pulling open her freezer door in bare feet the door scraped the top of the toe. It has been red and puffy ever since. She denies much pain, but of course she has some neuropathy in the feet.  ? ? ?Review of Systems  ?Constitutional: Negative.   ?Respiratory: Negative.    ?Cardiovascular: Negative.   ?Skin:  Positive for wound.  ? ?   ?Objective:  ? Physical Exam ?Constitutional:   ?   General: She is not in acute distress. ?   Appearance: Normal appearance.  ?Cardiovascular:  ?   Rate and Rhythm: Normal rate and regular rhythm.  ?   Pulses: Normal pulses.  ?   Heart sounds: Normal heart sounds.  ?Pulmonary:  ?   Effort: Pulmonary effort is normal.  ?   Breath sounds: Normal breath sounds.  ?Skin: ?   Comments: The dorsal left 2nd toe has a 0.5 cm laceration which is closed,  but this is surrounded by some swelling and redness   ?Neurological:  ?   Mental Status: She is alert.  ? ? ? ? ? ?   ?Assessment & Plan:  ?Cellulitis from a laceration. Treat with Keflex for 10 days.  ?Alysia Penna, MD ? ? ?

## 2022-01-21 ENCOUNTER — Inpatient Hospital Stay: Payer: Medicare Other | Attending: Oncology | Admitting: Oncology

## 2022-01-21 ENCOUNTER — Other Ambulatory Visit (HOSPITAL_COMMUNITY): Payer: Self-pay

## 2022-01-21 ENCOUNTER — Inpatient Hospital Stay: Payer: Medicare Other

## 2022-01-21 ENCOUNTER — Other Ambulatory Visit: Payer: Self-pay

## 2022-01-21 ENCOUNTER — Encounter: Payer: Self-pay | Admitting: Oncology

## 2022-01-21 ENCOUNTER — Ambulatory Visit: Payer: Medicare Other

## 2022-01-21 VITALS — BP 139/50 | HR 68 | Temp 98.2°F | Resp 20 | Ht 67.0 in | Wt 189.6 lb

## 2022-01-21 DIAGNOSIS — C9110 Chronic lymphocytic leukemia of B-cell type not having achieved remission: Secondary | ICD-10-CM

## 2022-01-21 DIAGNOSIS — E119 Type 2 diabetes mellitus without complications: Secondary | ICD-10-CM | POA: Diagnosis not present

## 2022-01-21 DIAGNOSIS — Z85820 Personal history of malignant melanoma of skin: Secondary | ICD-10-CM | POA: Diagnosis not present

## 2022-01-21 DIAGNOSIS — J449 Chronic obstructive pulmonary disease, unspecified: Secondary | ICD-10-CM | POA: Insufficient documentation

## 2022-01-21 DIAGNOSIS — G35 Multiple sclerosis: Secondary | ICD-10-CM | POA: Diagnosis not present

## 2022-01-21 DIAGNOSIS — R3129 Other microscopic hematuria: Secondary | ICD-10-CM | POA: Diagnosis not present

## 2022-01-21 LAB — LACTATE DEHYDROGENASE: LDH: 116 U/L (ref 98–192)

## 2022-01-21 LAB — CMP (CANCER CENTER ONLY)
ALT: 10 U/L (ref 0–44)
AST: 14 U/L — ABNORMAL LOW (ref 15–41)
Albumin: 4.1 g/dL (ref 3.5–5.0)
Alkaline Phosphatase: 52 U/L (ref 38–126)
Anion gap: 7 (ref 5–15)
BUN: 18 mg/dL (ref 8–23)
CO2: 30 mmol/L (ref 22–32)
Calcium: 9.6 mg/dL (ref 8.9–10.3)
Chloride: 105 mmol/L (ref 98–111)
Creatinine: 0.88 mg/dL (ref 0.44–1.00)
GFR, Estimated: >60 mL/min (ref >60)
Glucose, Bld: 87 mg/dL (ref 70–99)
Potassium: 4.8 mmol/L (ref 3.5–5.1)
Sodium: 142 mmol/L (ref 135–145)
Total Bilirubin: 0.6 mg/dL (ref 0.3–1.2)
Total Protein: 6.4 g/dL — ABNORMAL LOW (ref 6.5–8.1)

## 2022-01-21 LAB — CBC WITH DIFFERENTIAL (CANCER CENTER ONLY)
Abs Immature Granulocytes: 0.04 10*3/uL (ref 0.00–0.07)
Basophils Absolute: 0.1 10*3/uL (ref 0.0–0.1)
Basophils Relative: 1 %
Eosinophils Absolute: 0.1 10*3/uL (ref 0.0–0.5)
Eosinophils Relative: 2 %
HCT: 44.8 % (ref 36.0–46.0)
Hemoglobin: 14.2 g/dL (ref 12.0–15.0)
Immature Granulocytes: 1 %
Lymphocytes Relative: 22 %
Lymphs Abs: 1.9 10*3/uL (ref 0.7–4.0)
MCH: 28.5 pg (ref 26.0–34.0)
MCHC: 31.7 g/dL (ref 30.0–36.0)
MCV: 89.8 fL (ref 80.0–100.0)
Monocytes Absolute: 0.7 10*3/uL (ref 0.1–1.0)
Monocytes Relative: 9 %
Neutro Abs: 5.7 10*3/uL (ref 1.7–7.7)
Neutrophils Relative %: 65 %
Platelet Count: 182 10*3/uL (ref 150–400)
RBC: 4.99 MIL/uL (ref 3.87–5.11)
RDW: 12.9 % (ref 11.5–15.5)
WBC Count: 8.5 10*3/uL (ref 4.0–10.5)
nRBC: 0 % (ref 0.0–0.2)

## 2022-01-21 MED FILL — Ibrutinib Tab 420 MG: ORAL | 28 days supply | Qty: 28 | Fill #1 | Status: AC

## 2022-01-21 NOTE — Progress Notes (Signed)
?[place] ?OFFICE PROGRESS NOTE ? ? ?Diagnosis: CLL ? ?INTERVAL HISTORY:  ? ?Ms. Muha returns as scheduled.  She feels well.  She recently developed a toe laceration when she opened her freezer door.  She is taking an antibiotic.  The area is healing.  No fever, night sweats, or palpable lymph nodes.  No bleeding.  She continues ibrutinib. ?She reports microscopic hematuria on a urinalysis with Dr. Sarajane Jews.  She was referred to urology.  She reports a repeat urinalysis was negative. ? ?Objective: ? ?Vital signs in last 24 hours: ? ?Blood pressure (!) 139/50, pulse 68, temperature 98.2 ?F (36.8 ?C), temperature source Oral, resp. rate 20, height 5' 7"  (1.702 m), weight 189 lb 9.6 oz (86 kg), SpO2 96 %. ?  ? ? ?Lymphatics: No cervical, supraclavicular, axillary, or inguinal nodes ?Resp: Lungs clear bilaterally ?Cardio: Regular rate and rhythm ?GI: No hepatosplenomegaly, no mass ?Vascular: No leg edema  ?Skin: Small abrasion at the distal aspect of the left toe ? ? ?Lab Results: ? ?Lab Results  ?Component Value Date  ? WBC 8.6 09/24/2021  ? HGB 13.7 09/24/2021  ? HCT 43.9 09/24/2021  ? MCV 90.7 09/24/2021  ? PLT 154 09/24/2021  ? NEUTROABS 6.1 09/24/2021  ? ? ?CMP  ?Lab Results  ?Component Value Date  ? NA 142 09/24/2021  ? K 3.5 09/24/2021  ? CL 104 09/24/2021  ? CO2 30 09/24/2021  ? GLUCOSE 102 (H) 09/24/2021  ? BUN 13 09/24/2021  ? CREATININE 0.73 09/24/2021  ? CALCIUM 9.0 09/24/2021  ? PROT 6.2 (L) 09/24/2021  ? ALBUMIN 4.3 09/24/2021  ? AST 14 (L) 09/24/2021  ? ALT 10 09/24/2021  ? ALKPHOS 48 09/24/2021  ? BILITOT 0.6 09/24/2021  ? GFRNONAA >60 09/24/2021  ? GFRAA >60 06/22/2020  ? ? ? ?Medications: I have reviewed the patient's current medications. ? ? ?Assessment/Plan: ?Small lymphatic lymphoma/CLL diagnosed on core biopsy of mesenteric lymphadenopathy 06/20/2016 ?Monoclonal Kappa restricted B-cell population identified on flow cytometry ?Staging PET scan 06/13/2016 confirmed hypermetabolic  abdominal/pelvic lymphadenopathy ?FISH panel positive for 11q-,-12,13q-, and 17p-(10% of cells) ?Peripheral blood flow cytometry 09/22/2016 at [facility]-CD5/CD23 positive For restricted B-cell population, 2% of lymphocytes ?Peripheral blood IGH mutation analysis at [facility] 09/22/2016-unmutated ?CT 06/06/2017-generally stable diffuse lymphadenopathy, node in the pelvis is smaller, some the lymph nodes are larger ?CTs 07/02/2018- slight decrease in size of retrocrural, retroperitoneal, and mesenteric adenopathy, new 6 mm left upper lobe nodule ?CT chest 01/01/2019- increase in size of left upper lobe lung nodule-inflammatory appearance, other stable lung nodules, right retrocrural and upper abdominal adenopathy has progressed ?CTs 07/04/2019- progressive lower thoracic and abdominal adenopathy with dominant mass-effect on the stomach and pancreas, new heterogeneity in the spleen- possible splenic lesion, stable lung nodules ?Ibrutinib starting 08/14/2019 ?CT abdomen/pelvis 11/19/2019-decreased size of dominant abdominal mass and decreased abdominal lymphadenopathy ?CTs 06/22/2020-2 upper abdominal lymph nodes have decreased in size, no evidence of disease progression, unchanged bilateral lung nodules ?  ?Stage IB (T1b,N0) melanoma of the right forearm, status post a wide excision and sentinel lymph node biopsy at [facility] March 2017 ?  ?  ?3.    Multiple sclerosis ?  ?4.    Diabetes ?  ?5.   COPD ? ?6.  T12 compression fracture after a fall May 22, T12 kyphoplasty 05/07/2021 ? ? ? ? ?Disposition: ?Ms. Braxton appears stable.  She is tolerating the ibrutinib well.  She will continue ibrutinib.  She will return for an office and lab visit in 6  months.  She reports being up-to-date on influenza, pneumonia, and COVID-19 vaccines. ? ?[name], MD ? ?01/21/2022  ?11:25 AM ? ? ?

## 2022-01-24 DIAGNOSIS — M6281 Muscle weakness (generalized): Secondary | ICD-10-CM | POA: Diagnosis not present

## 2022-01-24 DIAGNOSIS — M545 Low back pain, unspecified: Secondary | ICD-10-CM | POA: Diagnosis not present

## 2022-01-24 DIAGNOSIS — S22080A Wedge compression fracture of T11-T12 vertebra, initial encounter for closed fracture: Secondary | ICD-10-CM | POA: Diagnosis not present

## 2022-01-24 DIAGNOSIS — R2681 Unsteadiness on feet: Secondary | ICD-10-CM | POA: Diagnosis not present

## 2022-01-26 ENCOUNTER — Other Ambulatory Visit (HOSPITAL_COMMUNITY): Payer: Self-pay

## 2022-01-27 ENCOUNTER — Encounter: Payer: Self-pay | Admitting: Podiatry

## 2022-01-27 ENCOUNTER — Ambulatory Visit (INDEPENDENT_AMBULATORY_CARE_PROVIDER_SITE_OTHER): Payer: Medicare Other | Admitting: Podiatry

## 2022-01-27 ENCOUNTER — Other Ambulatory Visit: Payer: Self-pay

## 2022-01-27 DIAGNOSIS — S22080A Wedge compression fracture of T11-T12 vertebra, initial encounter for closed fracture: Secondary | ICD-10-CM | POA: Diagnosis not present

## 2022-01-27 DIAGNOSIS — M76821 Posterior tibial tendinitis, right leg: Secondary | ICD-10-CM

## 2022-01-27 DIAGNOSIS — R2681 Unsteadiness on feet: Secondary | ICD-10-CM | POA: Diagnosis not present

## 2022-01-27 DIAGNOSIS — M545 Low back pain, unspecified: Secondary | ICD-10-CM | POA: Diagnosis not present

## 2022-01-27 DIAGNOSIS — M6281 Muscle weakness (generalized): Secondary | ICD-10-CM | POA: Diagnosis not present

## 2022-01-28 ENCOUNTER — Other Ambulatory Visit: Payer: Self-pay | Admitting: Family Medicine

## 2022-01-28 NOTE — Telephone Encounter (Signed)
Last refill historical provider- 02/11/2013 ?Last OV- 01/19/2022 ? ?Next OV- 02/16/2022. Can this patient receive a refill? ?

## 2022-01-31 DIAGNOSIS — M6281 Muscle weakness (generalized): Secondary | ICD-10-CM | POA: Diagnosis not present

## 2022-01-31 DIAGNOSIS — S22080A Wedge compression fracture of T11-T12 vertebra, initial encounter for closed fracture: Secondary | ICD-10-CM | POA: Diagnosis not present

## 2022-01-31 DIAGNOSIS — R2681 Unsteadiness on feet: Secondary | ICD-10-CM | POA: Diagnosis not present

## 2022-01-31 DIAGNOSIS — M545 Low back pain, unspecified: Secondary | ICD-10-CM | POA: Diagnosis not present

## 2022-02-01 ENCOUNTER — Encounter: Payer: Self-pay | Admitting: Podiatry

## 2022-02-01 NOTE — Progress Notes (Signed)
?Subjective:  ?Patient ID: Rachel Coffey, female    DOB: Aug 29, 1951,  MRN: [id number] ? ?Chief Complaint  ?Patient presents with  ? Foot Pain  ? ? ?71 y.o. female presents with the above complaint.  Patient presents with complaint of right medial foot pain.  Patient states is sore again.  She request another injection.  She states is painful to touch painful to walk on has progressed gotten worse.  She denies any other acute complaints.  She was doing good overall since last October. ? ? ?Review of Systems: Negative except as noted in the HPI. Denies N/V/F/Ch. ? ?Past Medical History:  ?Diagnosis Date  ? Asthma   ? stress induced with allergies  ? Cancer [facility])   ? CLL (chronic lymphocytic leukemia) ([place])   ? sees Dr. Benay Spice  ? Compression fracture of thoracic vertebra (HCC)   ? at T12, sees Dr. Kary Kos  ? Genital warts   ? Hyperlipidemia   ? MS (multiple sclerosis) ([place])   ? sees Dr. Otto Herb of [facility] at the [facility] office  ? Type 2 diabetes mellitus ([place])   ? ? ?Current Outpatient Medications:  ?  acetaminophen (TYLENOL) 500 MG tablet, Take 500 mg by mouth as needed., Disp: , Rfl:  ?  ALPRAZolam (XANAX) 0.5 MG tablet, Take 0.5 mg by mouth daily as needed (for pain). , Disp: , Rfl:  ?  baclofen (LIORESAL) 10 MG tablet, Take 5 mg by mouth 3 (three) times daily as needed., Disp: , Rfl:  ?  Cholecalciferol (VITAMIN D) 50 MCG (2000 UT) CAPS, Take 2,000 Units by mouth daily. , Disp: , Rfl:  ?  diclofenac Sodium (VOLTAREN) 1 % GEL, Apply 1 application topically 4 (four) times daily as needed (pain). (Patient not taking: Reported on 01/21/2022), Disp: , Rfl:  ?  esomeprazole (NEXIUM) 20 MG capsule, Take 20 mg by mouth daily., Disp: , Rfl:  ?  fluticasone (FLONASE) 50 MCG/ACT nasal spray, Place 1 spray into both nostrils daily as needed for allergies. Sometimes bid, Disp: , Rfl:  ?  gabapentin (NEURONTIN) 100 MG capsule, TAKE ONE CAPSULE BY MOUTH EVERY NIGHT AT BEDTIME, Disp: 90 capsule, Rfl: 0 ?   glipiZIDE (GLUCOTROL XL) 2.5 MG 24 hr tablet, Take 1 tablet (2.5 mg total) by mouth 2 (two) times daily., Disp: 180 tablet, Rfl: 3 ?  ibrutinib (IMBRUVICA) 420 MG tablet, TAKE 1 TABLET (420 MG) BY MOUTH DAILY. ADMINISTER WITH WATER AT APPROXIMATELY THE SAME TIME EACH DAY., Disp: 28 tablet, Rfl: 3 ?  loratadine (CLARITIN) 10 MG tablet, Take 10 mg by mouth daily as needed (allergies)., Disp: , Rfl:  ?  losartan (COZAAR) 25 MG tablet, TAKE ONE TABLET ('25MG'$  TOTAL) BY MOUTH DAILY, Disp: 90 tablet, Rfl: 0 ?  meloxicam (MOBIC) 15 MG tablet, TAKE ONE (1) TABLET BY MOUTH EVERY DAY AS NEEDED FOR KNEE PAIN (Patient not taking: Reported on 01/21/2022), Disp: 30 tablet, Rfl: 0 ?  simvastatin (ZOCOR) 20 MG tablet, TAKE ONE TABLET ('20MG'$  TOTAL) BY MOUTH ATBEDTIME, Disp: 90 tablet, Rfl: 0 ?  VENTOLIN HFA 108 (90 Base) MCG/ACT inhaler, INHALE 2 PUFFS EVERY 4 TO 6 HOURS AS NEEDED. SHAKE WELL BEFORE EACH USE., Disp: 18 g, Rfl: 5 ? ?Social History  ? ?Tobacco Use  ?Smoking Status Former  ? Types: Cigarettes  ? Quit date: 01/31/2005  ? Years since quitting: 17.0  ?Smokeless Tobacco Never  ? ? ?Allergies  ?Allergen Reactions  ? Amoxicillin-Pot Clavulanate Other (See Comments)  ?  Pt preferred - causes upset stomach   ? Codeine Itching  ? Fenofibrate   ?  aching joints  ? Hydrocodone Itching  ? Hydrocodone-Acetaminophen Itching  ? Lisinopril Cough  ? Metformin And Related   ?  Makes pt deathly ill  ? ?Objective:  ?There were no vitals filed for this visit. ?There is no height or weight on file to calculate BMI. ?Constitutional Well developed. ?Well nourished.  ?Vascular Dorsalis pedis pulses palpable bilaterally. ?Posterior tibial pulses palpable bilaterally. ?Capillary refill normal to all digits.  ?No cyanosis or clubbing noted. ?Pedal hair growth normal.  ?Neurologic Normal speech. ?Oriented to person, place, and time. ?Epicritic sensation to light touch grossly present bilaterally.  ?Dermatologic Nails well groomed and normal in  appearance. ?No open wounds. ?No skin lesions.  ?Orthopedic: Pain on palpation on the course of the posterior tibial tendon including the insertion.  Pain with resisted plantarflexion inversion of the foot.  No pain with dorsiflexion eversion of the foot.  ? ?Radiographs: None ?Assessment:  ? ?1. Posterior tibial tendinitis, right   ? ?Plan:  ?Patient was evaluated and treated and all questions answered. ? ?Right posterior tibial tendinitis ?-I explained the patient the etiology of tendinitis and various treatment options were discussed.  I discussed with her shoe gear modification and the importance of them.  Given the amount of pain that she is having I believe she will benefit from a steroid injection to help decrease acute inflammatory component associate with pain.  I discussed with her given that this is near the tendon there is a risk of rupture associated with it.  She states understanding and would like to proceed with injection ?-A steroid injection was performed at Right medial foot at point of maximal tenderness using 1% plain Lidocaine and 10 mg of Kenalog posterior tibial tendinitis right. This was well tolerated. ? ? ?No follow-ups on file.  ?

## 2022-02-03 DIAGNOSIS — R2681 Unsteadiness on feet: Secondary | ICD-10-CM | POA: Diagnosis not present

## 2022-02-03 DIAGNOSIS — M545 Low back pain, unspecified: Secondary | ICD-10-CM | POA: Diagnosis not present

## 2022-02-03 DIAGNOSIS — M6281 Muscle weakness (generalized): Secondary | ICD-10-CM | POA: Diagnosis not present

## 2022-02-03 DIAGNOSIS — S22080A Wedge compression fracture of T11-T12 vertebra, initial encounter for closed fracture: Secondary | ICD-10-CM | POA: Diagnosis not present

## 2022-02-07 ENCOUNTER — Other Ambulatory Visit: Payer: Self-pay | Admitting: Family Medicine

## 2022-02-07 DIAGNOSIS — S22080A Wedge compression fracture of T11-T12 vertebra, initial encounter for closed fracture: Secondary | ICD-10-CM | POA: Diagnosis not present

## 2022-02-07 DIAGNOSIS — M6281 Muscle weakness (generalized): Secondary | ICD-10-CM | POA: Diagnosis not present

## 2022-02-07 DIAGNOSIS — M545 Low back pain, unspecified: Secondary | ICD-10-CM | POA: Diagnosis not present

## 2022-02-07 DIAGNOSIS — R2681 Unsteadiness on feet: Secondary | ICD-10-CM | POA: Diagnosis not present

## 2022-02-10 ENCOUNTER — Other Ambulatory Visit: Payer: Self-pay

## 2022-02-10 ENCOUNTER — Encounter: Payer: Self-pay | Admitting: Family Medicine

## 2022-02-10 DIAGNOSIS — M6281 Muscle weakness (generalized): Secondary | ICD-10-CM | POA: Diagnosis not present

## 2022-02-10 DIAGNOSIS — S22080A Wedge compression fracture of T11-T12 vertebra, initial encounter for closed fracture: Secondary | ICD-10-CM | POA: Diagnosis not present

## 2022-02-10 DIAGNOSIS — M545 Low back pain, unspecified: Secondary | ICD-10-CM | POA: Diagnosis not present

## 2022-02-10 DIAGNOSIS — R2681 Unsteadiness on feet: Secondary | ICD-10-CM | POA: Diagnosis not present

## 2022-02-10 MED ORDER — LANCETS MISC. MISC: 1.0000 | 0 refills | Status: AC

## 2022-02-10 MED ORDER — CONTOUR NEXT TEST VI STRP: ORAL_STRIP | 0 refills | Status: DC

## 2022-02-11 NOTE — Telephone Encounter (Signed)
Pt Rx was faxed as requested ?

## 2022-02-13 ENCOUNTER — Encounter: Payer: Self-pay | Admitting: Family Medicine

## 2022-02-14 ENCOUNTER — Encounter: Payer: Self-pay | Admitting: Family Medicine

## 2022-02-14 NOTE — Telephone Encounter (Signed)
I corrected the chart   ?

## 2022-02-16 ENCOUNTER — Ambulatory Visit (INDEPENDENT_AMBULATORY_CARE_PROVIDER_SITE_OTHER): Payer: Medicare Other | Admitting: Family Medicine

## 2022-02-16 ENCOUNTER — Encounter: Payer: Self-pay | Admitting: Family Medicine

## 2022-02-16 VITALS — BP 130/58 | HR 73 | Temp 98.6°F | Wt 189.0 lb

## 2022-02-16 DIAGNOSIS — G5793 Unspecified mononeuropathy of bilateral lower limbs: Secondary | ICD-10-CM | POA: Insufficient documentation

## 2022-02-16 DIAGNOSIS — M545 Low back pain, unspecified: Secondary | ICD-10-CM

## 2022-02-16 DIAGNOSIS — E119 Type 2 diabetes mellitus without complications: Secondary | ICD-10-CM

## 2022-02-16 MED ORDER — LOSARTAN POTASSIUM 25 MG PO TABS: ORAL_TABLET | ORAL | 3 refills | Status: DC

## 2022-02-16 MED ORDER — SIMVASTATIN 20 MG PO TABS: ORAL_TABLET | ORAL | 3 refills | Status: DC

## 2022-02-16 MED ORDER — GABAPENTIN 300 MG PO CAPS: 300.0000 mg | ORAL_CAPSULE | Freq: Two times a day (BID) | ORAL | 5 refills | Status: DC

## 2022-02-16 NOTE — Progress Notes (Signed)
? ?  Subjective:  ? ? Patient ID: SHEVONNE WOLF, female    DOB: 1951-08-28, 71 y.o.   MRN: [id number] ? ?HPI ?Here to follow up. Her BP is stable. Her am fasting glucoses average 110-115. She still has some neuropathy in both feet (burning and tingling) and she still has back pain. She continues to go to PT sessions. Her previous provider started her on Gabapentin 100 mg to take at bedtime, but this has not helped at all.  ? ? ?Review of Systems  ?Constitutional: Negative.   ?Respiratory: Negative.    ?Cardiovascular: Negative.   ?Musculoskeletal:  Positive for back pain.  ? ?   ?Objective:  ? Physical Exam ?Constitutional:   ?   Appearance: Normal appearance.  ?Cardiovascular:  ?   Rate and Rhythm: Normal rate and regular rhythm.  ?   Pulses: Normal pulses.  ?   Heart sounds: Normal heart sounds.  ?Pulmonary:  ?   Effort: Pulmonary effort is normal.  ?   Breath sounds: Normal breath sounds.  ?Neurological:  ?   General: No focal deficit present.  ?   Mental Status: She is alert and oriented to person, place, and time.  ? ? ? ? ? ?   ?Assessment & Plan:  ?Her diabetes and HTN are well controlled. For the back pain and the neuropathy, we will increase the Gabapentin to 300 mg BID. ?Alysia Penna, MD ? ? ?

## 2022-02-17 ENCOUNTER — Other Ambulatory Visit (HOSPITAL_COMMUNITY): Payer: Self-pay

## 2022-02-17 DIAGNOSIS — S22080A Wedge compression fracture of T11-T12 vertebra, initial encounter for closed fracture: Secondary | ICD-10-CM | POA: Diagnosis not present

## 2022-02-17 DIAGNOSIS — M545 Low back pain, unspecified: Secondary | ICD-10-CM | POA: Diagnosis not present

## 2022-02-17 DIAGNOSIS — R2681 Unsteadiness on feet: Secondary | ICD-10-CM | POA: Diagnosis not present

## 2022-02-17 DIAGNOSIS — M6281 Muscle weakness (generalized): Secondary | ICD-10-CM | POA: Diagnosis not present

## 2022-02-17 MED FILL — Ibrutinib Tab 420 MG: ORAL | 28 days supply | Qty: 28 | Fill #2 | Status: AC

## 2022-02-22 DIAGNOSIS — G35 Multiple sclerosis: Secondary | ICD-10-CM | POA: Diagnosis not present

## 2022-02-24 ENCOUNTER — Other Ambulatory Visit (HOSPITAL_COMMUNITY): Payer: Self-pay

## 2022-03-01 DIAGNOSIS — M6281 Muscle weakness (generalized): Secondary | ICD-10-CM | POA: Diagnosis not present

## 2022-03-01 DIAGNOSIS — M545 Low back pain, unspecified: Secondary | ICD-10-CM | POA: Diagnosis not present

## 2022-03-01 DIAGNOSIS — S22080A Wedge compression fracture of T11-T12 vertebra, initial encounter for closed fracture: Secondary | ICD-10-CM | POA: Diagnosis not present

## 2022-03-01 DIAGNOSIS — R2681 Unsteadiness on feet: Secondary | ICD-10-CM | POA: Diagnosis not present

## 2022-03-10 DIAGNOSIS — M6281 Muscle weakness (generalized): Secondary | ICD-10-CM | POA: Diagnosis not present

## 2022-03-10 DIAGNOSIS — R2681 Unsteadiness on feet: Secondary | ICD-10-CM | POA: Diagnosis not present

## 2022-03-10 DIAGNOSIS — S22080A Wedge compression fracture of T11-T12 vertebra, initial encounter for closed fracture: Secondary | ICD-10-CM | POA: Diagnosis not present

## 2022-03-10 DIAGNOSIS — M545 Low back pain, unspecified: Secondary | ICD-10-CM | POA: Diagnosis not present

## 2022-03-14 ENCOUNTER — Other Ambulatory Visit (HOSPITAL_COMMUNITY): Payer: Self-pay

## 2022-03-14 MED FILL — Ibrutinib Tab 420 MG: ORAL | 28 days supply | Qty: 28 | Fill #3 | Status: AC

## 2022-03-21 DIAGNOSIS — R059 Cough, unspecified: Secondary | ICD-10-CM | POA: Diagnosis not present

## 2022-03-21 DIAGNOSIS — Z20822 Contact with and (suspected) exposure to covid-19: Secondary | ICD-10-CM | POA: Diagnosis not present

## 2022-03-21 DIAGNOSIS — R051 Acute cough: Secondary | ICD-10-CM | POA: Diagnosis not present

## 2022-03-24 ENCOUNTER — Other Ambulatory Visit (HOSPITAL_COMMUNITY): Payer: Self-pay

## 2022-03-28 ENCOUNTER — Encounter: Payer: Self-pay | Admitting: Family Medicine

## 2022-03-28 ENCOUNTER — Ambulatory Visit (INDEPENDENT_AMBULATORY_CARE_PROVIDER_SITE_OTHER): Payer: Medicare Other | Admitting: Family Medicine

## 2022-03-28 VITALS — BP 140/60 | HR 70 | Temp 98.1°F | Ht 67.0 in | Wt 178.3 lb

## 2022-03-28 DIAGNOSIS — L255 Unspecified contact dermatitis due to plants, except food: Secondary | ICD-10-CM

## 2022-03-28 MED ORDER — PREDNISONE 10 MG PO TABS: ORAL_TABLET | ORAL | 0 refills | Status: DC

## 2022-03-28 NOTE — Progress Notes (Signed)
? ?Established Patient Office Visit ? ?Subjective   ?Patient ID: Rachel Coffey, female    DOB: August 02, 1951  Age: 71 y.o. MRN: [id number] ? ?Chief Complaint  ?Patient presents with  ? Allergic Reaction  ?  Patient complains of Hives on shoulder arms and back ,x2 weeks, Tried Hydrocortisone cream with some relief  ? ? ?HPI ? ? ?Seen today with greater than 2-week history of bilateral upper extremity pruritic rash.  She first noticed this after doing some yard work.  She and her husband noticed that there was some poison ivy in the yard.  She tried Benadryl orally and hydrocortisone cream without any relief.  Pruritus is severe at times.  No lower extremity involvement.  She does have type 2 diabetes but well controlled on glipizide.  Recent A1c 6.3%. ? ?Past Medical History:  ?Diagnosis Date  ? Asthma   ? stress induced with allergies  ? Cancer [place])   ? CLL (chronic lymphocytic leukemia) ([place])   ? sees Dr. Benay Spice  ? Compression fracture of thoracic vertebra (HCC)   ? at T11 and T12, sees Dr. Kary Kos  ? Genital warts   ? Hyperlipidemia   ? MS (multiple sclerosis) ([place])   ? sees Dr. Otto Herb of [facility] at the [facility] office  ? Type 2 diabetes mellitus ([place])   ? ?Past Surgical History:  ?Procedure Laterality Date  ? ABDOMINAL HYSTERECTOMY    ? with 1 ovary removed-[facility]  ? CHOLECYSTECTOMY N/A 02/25/2013  ? Procedure: LAPAROSCOPIC CHOLECYSTECTOMY WITH INTRAOPERATIVE CHOLANGIOGRAM;  Surgeon: Earnstine Regal, MD;  Location: [facility] ORS;  Service: General;  Laterality: N/A;  ? COLONOSCOPY  02/19/2013  ? per Dr. Teena Irani, clear, repeat in 10 yrs  ? IR KYPHO THORACIC WITH BONE BIOPSY  05/07/2021  ? TONSILLECTOMY    ? ? reports that she quit smoking about 17 years ago. Her smoking use included cigarettes. She has never used smokeless tobacco. She reports that she does not drink alcohol and does not use drugs. ?family history includes COPD in her mother; Diabetes in her father; Heart disease in her mother and  sister. ?Allergies  ?Allergen Reactions  ? Amoxicillin-Pot Clavulanate Other (See Comments)  ?  Pt preferred - causes upset stomach   ? Codeine Itching  ? Fenofibrate   ?  aching joints  ? Hydrocodone Itching  ? Hydrocodone-Acetaminophen Itching  ? Lisinopril Cough  ? Metformin And Related   ?  Makes pt deathly ill  ? ? ?Review of Systems  ?Constitutional:  Negative for chills and fever.  ?Skin:  Positive for itching and rash.  ? ?  ?Objective:  ?  ? ?BP 140/60 (BP Location: Left Arm, Patient Position: Sitting, Cuff Size: Normal)   Pulse 70   Temp 98.1 ?F (36.7 ?C) (Oral)   Ht '5\' 7"'$  (1.702 m)   Wt 178 lb 4.8 oz (80.9 kg)   SpO2 94%   BMI 27.93 kg/m?  ? ? ?Physical Exam ?Vitals reviewed.  ?Constitutional:   ?   Appearance: Normal appearance.  ?Cardiovascular:  ?   Rate and Rhythm: Normal rate.  ?Skin: ?   Findings: Rash present.  ?   Comments: Scattered rash upper extremities.  Erythematous base and slightly raised.  Nonpustular.  Nonscaly.  ?Neurological:  ?   Mental Status: She is alert.  ? ? ? ?No results found for any visits on 03/28/22. ? ? ? ?The ASCVD Risk score (Arnett DK, et al., 2019) failed to calculate for the  following reasons: ?  Cannot find a previous HDL lab ?  Cannot find a previous total cholesterol lab ? ?  ?Assessment & Plan:  ? ?Upper extremity rash consistent with contact dermatitis. ?We discussed prednisone taper realizing that this will elevate her blood sugars some.  We recommend close monitoring of glucose.  Prednisone taper starting at 40 mg daily.  May continue Benadryl at night as needed and topical hydrocortisone cream. ? ?No follow-ups on file.  ? ? ?Carolann Littler, MD ? ?

## 2022-03-30 ENCOUNTER — Telehealth: Payer: Self-pay | Admitting: Family Medicine

## 2022-03-30 NOTE — Telephone Encounter (Signed)
Left detailed message for pt regarding Dr Sarajane Jews recommendation, advised pt to call the office with any question ?

## 2022-03-30 NOTE — Telephone Encounter (Signed)
Yes have her take a third Glipizide daily until the glucoses drop below 200  ?

## 2022-03-30 NOTE — Telephone Encounter (Signed)
Pt called in with a question: ? ?Should she take a 3rd dose of her glipizide while on prednisone because the prednisone is shooting her sugar levels up to the 400 level? ? ?She usually take two doses a day of glipizide but yesterday she took three and her levels went down but it took hours before it even got to 200. ? ?She would like to get advice on how she should proceed with taking prednisone.  ? ?Please advise.  ?

## 2022-03-30 NOTE — Telephone Encounter (Signed)
Please advise 

## 2022-04-04 NOTE — Telephone Encounter (Signed)
--  Caller states she is a diabetic and she was put on Prednisone for contact dermatitis and was told her sugars might go up. She is on Glipizide for the diabetes. At 3:30pm today, her blood sugar was 212 and she took 5 mg of the Glipizide. Now, her blood sugar is 336. Today is day 5 of Prednisone. Reports she is experiencing hot flashes and wants to know if her medication should be adjusted.  04/01/2022 6:31:50 PM Called On-Call Provider Gilford Rile, RN, [place] 04/01/2022 6:25:01 PM Call PCP Now Velta Addison Gilford Rile, RN, Tery Sanfilippo- MD 04/01/2022 6:35:13PM Spoke with On Call  OCP states pt can take an extra Glipizide now and if her blood sugar continues to go up, she should get examined at an UC or hospital.

## 2022-04-12 ENCOUNTER — Ambulatory Visit (INDEPENDENT_AMBULATORY_CARE_PROVIDER_SITE_OTHER): Payer: Medicare Other | Admitting: Family Medicine

## 2022-04-12 ENCOUNTER — Encounter: Payer: Self-pay | Admitting: Family Medicine

## 2022-04-12 VITALS — BP 154/66 | HR 78 | Temp 98.6°F | Ht 67.0 in | Wt 188.0 lb

## 2022-04-12 DIAGNOSIS — J029 Acute pharyngitis, unspecified: Secondary | ICD-10-CM

## 2022-04-12 DIAGNOSIS — J40 Bronchitis, not specified as acute or chronic: Secondary | ICD-10-CM | POA: Diagnosis not present

## 2022-04-12 LAB — POCT RAPID STREP A (OFFICE): Rapid Strep A Screen: NEGATIVE

## 2022-04-12 LAB — POCT INFLUENZA A/B
Influenza A, POC: NEGATIVE
Influenza B, POC: NEGATIVE

## 2022-04-12 MED ORDER — AZITHROMYCIN 250 MG PO TABS: ORAL_TABLET | ORAL | 0 refills | Status: DC

## 2022-04-12 NOTE — Progress Notes (Signed)
   Subjective:    Patient ID: Rachel Coffey, female    DOB: 1951/03/09, 71 y.o.   MRN: [id number]  HPI Here for 5 days of headache, ST, chest congestion, and a dry cough. No fever. She has tested negative for the Covid virus twice. Taking Mucinex and using her albuterol inhaler.    Review of Systems  Constitutional: Negative.   HENT:  Positive for congestion, postnasal drip and sore throat.   Eyes: Negative.   Respiratory:  Positive for cough, chest tightness and wheezing. Negative for shortness of breath.   Cardiovascular: Negative.   Gastrointestinal: Negative.       Objective:   Physical Exam Constitutional:      Appearance: Normal appearance.  HENT:     Right Ear: Tympanic membrane, ear canal and external ear normal.     Left Ear: Tympanic membrane, ear canal and external ear normal.     Nose: Nose normal.     Mouth/Throat:     Pharynx: Oropharynx is clear.  Eyes:     Conjunctiva/sclera: Conjunctivae normal.  Cardiovascular:     Rate and Rhythm: Normal rate and regular rhythm.     Pulses: Normal pulses.     Heart sounds: Normal heart sounds.  Pulmonary:     Effort: Pulmonary effort is normal.     Breath sounds: Wheezing and rhonchi present. No rales.  Neurological:     Mental Status: She is alert.          Assessment & Plan:  Bronchitis, treat with a Zpack. Recheck as needed. Alysia Penna, MD

## 2022-04-15 ENCOUNTER — Other Ambulatory Visit (HOSPITAL_COMMUNITY): Payer: Self-pay

## 2022-04-15 ENCOUNTER — Other Ambulatory Visit: Payer: Self-pay | Admitting: Oncology

## 2022-04-15 DIAGNOSIS — C9110 Chronic lymphocytic leukemia of B-cell type not having achieved remission: Secondary | ICD-10-CM

## 2022-04-15 MED ORDER — IBRUTINIB 420 MG PO TABS
ORAL_TABLET | ORAL | 3 refills | Status: DC
  Filled 2022-04-15: qty 28, 28d supply, fill #0
  Filled 2022-05-11: qty 28, 28d supply, fill #1

## 2022-04-20 ENCOUNTER — Telehealth: Payer: Self-pay | Admitting: Family Medicine

## 2022-04-20 DIAGNOSIS — M545 Low back pain, unspecified: Secondary | ICD-10-CM

## 2022-04-20 NOTE — Telephone Encounter (Signed)
She asks to have her PT renewed for her back

## 2022-04-21 ENCOUNTER — Other Ambulatory Visit (HOSPITAL_COMMUNITY): Payer: Self-pay

## 2022-04-21 DIAGNOSIS — M6281 Muscle weakness (generalized): Secondary | ICD-10-CM | POA: Diagnosis not present

## 2022-04-21 DIAGNOSIS — M545 Low back pain, unspecified: Secondary | ICD-10-CM | POA: Diagnosis not present

## 2022-04-21 DIAGNOSIS — S22080A Wedge compression fracture of T11-T12 vertebra, initial encounter for closed fracture: Secondary | ICD-10-CM | POA: Diagnosis not present

## 2022-04-21 DIAGNOSIS — R2681 Unsteadiness on feet: Secondary | ICD-10-CM | POA: Diagnosis not present

## 2022-04-23 ENCOUNTER — Encounter: Payer: Self-pay | Admitting: Family Medicine

## 2022-04-26 DIAGNOSIS — M6281 Muscle weakness (generalized): Secondary | ICD-10-CM | POA: Diagnosis not present

## 2022-04-26 DIAGNOSIS — S22080A Wedge compression fracture of T11-T12 vertebra, initial encounter for closed fracture: Secondary | ICD-10-CM | POA: Diagnosis not present

## 2022-04-26 DIAGNOSIS — R2681 Unsteadiness on feet: Secondary | ICD-10-CM | POA: Diagnosis not present

## 2022-04-26 DIAGNOSIS — M545 Low back pain, unspecified: Secondary | ICD-10-CM | POA: Diagnosis not present

## 2022-04-27 ENCOUNTER — Encounter: Payer: Self-pay | Admitting: Family Medicine

## 2022-04-27 ENCOUNTER — Ambulatory Visit (INDEPENDENT_AMBULATORY_CARE_PROVIDER_SITE_OTHER): Payer: Medicare Other | Admitting: Family Medicine

## 2022-04-27 VITALS — BP 110/58 | HR 74 | Temp 98.7°F | Wt 186.0 lb

## 2022-04-27 DIAGNOSIS — G8929 Other chronic pain: Secondary | ICD-10-CM

## 2022-04-27 DIAGNOSIS — M546 Pain in thoracic spine: Secondary | ICD-10-CM

## 2022-04-27 MED ORDER — LIDOCAINE 5 % EX PTCH: 1.0000 | MEDICATED_PATCH | CUTANEOUS | 5 refills | Status: DC

## 2022-04-27 NOTE — Progress Notes (Signed)
   Subjective:    Patient ID: Rachel Coffey, female    DOB: 03/26/1951, 71 y.o.   MRN: [id number]  HPI Here for painful spasms in the lower back over th past week. She is not sure what caused them. Heat and Baclofen have not helped.    Review of Systems  Constitutional: Negative.   Respiratory: Negative.    Cardiovascular: Negative.   Musculoskeletal:  Positive for back pain.       Objective:   Physical Exam Constitutional:      General: She is not in acute distress.    Appearance: Normal appearance.  Cardiovascular:     Rate and Rhythm: Normal rate and regular rhythm.  Pulmonary:     Effort: Pulmonary effort is normal.     Breath sounds: Normal breath sounds.  Musculoskeletal:     Comments: There is  fair amount of spasm around the lower thoracic spine, the right worse than the left. This area is tender. ROM is limited by pain.   Neurological:     Mental Status: She is alert.           Assessment & Plan:  Thoracic back pain with spasms. She will try Lidocaine patches as needed. She has also started seeing [facility] PT again, so hopefull this will help.  Alysia Penna, MD

## 2022-05-03 DIAGNOSIS — S22080A Wedge compression fracture of T11-T12 vertebra, initial encounter for closed fracture: Secondary | ICD-10-CM | POA: Diagnosis not present

## 2022-05-03 DIAGNOSIS — R2681 Unsteadiness on feet: Secondary | ICD-10-CM | POA: Diagnosis not present

## 2022-05-03 DIAGNOSIS — M6281 Muscle weakness (generalized): Secondary | ICD-10-CM | POA: Diagnosis not present

## 2022-05-03 DIAGNOSIS — M545 Low back pain, unspecified: Secondary | ICD-10-CM | POA: Diagnosis not present

## 2022-05-04 ENCOUNTER — Telehealth: Payer: Self-pay | Admitting: Pharmacy Technician

## 2022-05-04 ENCOUNTER — Other Ambulatory Visit (HOSPITAL_COMMUNITY): Payer: Self-pay

## 2022-05-04 NOTE — Telephone Encounter (Signed)
Oral Oncology Patient Advocate Encounter  Coordinated with patient to sign application for Wynetta Emery and [place] ([employer]PA[employer]) in an effort to reduce patient's out of pocket expense for Imbruvica to $0.    Application completed and faxed to [phone].   [employer] patient assistance phone number for follow up is [phone].   This encounter will be updated until final determination.   [place] Patient [place] Phone [phone] Fax [phone] 05/04/2022 3:38 PM

## 2022-05-05 DIAGNOSIS — M6281 Muscle weakness (generalized): Secondary | ICD-10-CM | POA: Diagnosis not present

## 2022-05-05 DIAGNOSIS — R2681 Unsteadiness on feet: Secondary | ICD-10-CM | POA: Diagnosis not present

## 2022-05-05 DIAGNOSIS — S22080A Wedge compression fracture of T11-T12 vertebra, initial encounter for closed fracture: Secondary | ICD-10-CM | POA: Diagnosis not present

## 2022-05-05 DIAGNOSIS — M545 Low back pain, unspecified: Secondary | ICD-10-CM | POA: Diagnosis not present

## 2022-05-06 DIAGNOSIS — L578 Other skin changes due to chronic exposure to nonionizing radiation: Secondary | ICD-10-CM | POA: Diagnosis not present

## 2022-05-06 DIAGNOSIS — D225 Melanocytic nevi of trunk: Secondary | ICD-10-CM | POA: Diagnosis not present

## 2022-05-06 DIAGNOSIS — Z85820 Personal history of malignant melanoma of skin: Secondary | ICD-10-CM | POA: Diagnosis not present

## 2022-05-06 DIAGNOSIS — L570 Actinic keratosis: Secondary | ICD-10-CM | POA: Diagnosis not present

## 2022-05-06 DIAGNOSIS — L720 Epidermal cyst: Secondary | ICD-10-CM | POA: Diagnosis not present

## 2022-05-06 DIAGNOSIS — D2271 Melanocytic nevi of right lower limb, including hip: Secondary | ICD-10-CM | POA: Diagnosis not present

## 2022-05-06 DIAGNOSIS — Z85828 Personal history of other malignant neoplasm of skin: Secondary | ICD-10-CM | POA: Diagnosis not present

## 2022-05-06 DIAGNOSIS — L910 Hypertrophic scar: Secondary | ICD-10-CM | POA: Diagnosis not present

## 2022-05-06 DIAGNOSIS — Z808 Family history of malignant neoplasm of other organs or systems: Secondary | ICD-10-CM | POA: Diagnosis not present

## 2022-05-06 DIAGNOSIS — L821 Other seborrheic keratosis: Secondary | ICD-10-CM | POA: Diagnosis not present

## 2022-05-06 DIAGNOSIS — L219 Seborrheic dermatitis, unspecified: Secondary | ICD-10-CM | POA: Diagnosis not present

## 2022-05-10 DIAGNOSIS — M6281 Muscle weakness (generalized): Secondary | ICD-10-CM | POA: Diagnosis not present

## 2022-05-10 DIAGNOSIS — M545 Low back pain, unspecified: Secondary | ICD-10-CM | POA: Diagnosis not present

## 2022-05-10 DIAGNOSIS — S22080A Wedge compression fracture of T11-T12 vertebra, initial encounter for closed fracture: Secondary | ICD-10-CM | POA: Diagnosis not present

## 2022-05-10 DIAGNOSIS — R2681 Unsteadiness on feet: Secondary | ICD-10-CM | POA: Diagnosis not present

## 2022-05-11 ENCOUNTER — Other Ambulatory Visit (HOSPITAL_COMMUNITY): Payer: Self-pay

## 2022-05-11 ENCOUNTER — Encounter: Payer: Self-pay | Admitting: Family

## 2022-05-11 ENCOUNTER — Telehealth (INDEPENDENT_AMBULATORY_CARE_PROVIDER_SITE_OTHER): Payer: Medicare Other | Admitting: Family

## 2022-05-11 VITALS — Temp 98.6°F | Wt 185.2 lb

## 2022-05-11 DIAGNOSIS — B9689 Other specified bacterial agents as the cause of diseases classified elsewhere: Secondary | ICD-10-CM | POA: Diagnosis not present

## 2022-05-11 DIAGNOSIS — R051 Acute cough: Secondary | ICD-10-CM

## 2022-05-11 DIAGNOSIS — J329 Chronic sinusitis, unspecified: Secondary | ICD-10-CM | POA: Diagnosis not present

## 2022-05-11 MED ORDER — DOXYCYCLINE HYCLATE 100 MG PO TABS: 100.0000 mg | ORAL_TABLET | Freq: Two times a day (BID) | ORAL | 0 refills | Status: DC

## 2022-05-12 ENCOUNTER — Other Ambulatory Visit (HOSPITAL_COMMUNITY): Payer: Self-pay

## 2022-05-12 NOTE — Progress Notes (Addendum)
Virtual Visit via Telephone Note  I connected with Rachel Coffey on 05/12/22 at 11:45 AM EDT by telephone and verified that I am speaking with the correct person using two identifiers.  Location: Patient: Home Provider: [facility]   I discussed the limitations, risks, security and privacy concerns of performing an evaluation and management service by telephone and the availability of in person appointments. I also discussed with the patient that there may be a patient responsible charge related to this service. The patient expressed understanding and agreed to proceed.   History of Present Illness: 4 days of cough, congestion, sinus pressure, that is worsening. Patient has been using Flonase and Mucinex that has not helped much.     Observations/Objective: A&O, NAD   Assessment and Plan: Rachel Coffey was seen today for cough.  Diagnoses and all orders for this visit:  Bacterial sinusitis  Acute cough  Other orders -     doxycycline (VIBRA-TABS) 100 MG tablet; Take 1 tablet (100 mg total) by mouth 2 (two) times daily.     Call the office if symptoms worsen or persist. Recheck as scheduled and sooner as needed.   Follow Up Instructions: Call the office     I discussed the assessment and treatment plan with the patient. The patient was provided an opportunity to ask questions and all were answered. The patient agreed with the plan and demonstrated an understanding of the instructions.   The patient was advised to call back or seek an in-person evaluation if the symptoms worsen or if the condition fails to improve as anticipated.  I provided 20 minutes of non-face-to-face time during this encounter.   Kennyth Arnold, FNP

## 2022-05-16 DIAGNOSIS — M6281 Muscle weakness (generalized): Secondary | ICD-10-CM | POA: Diagnosis not present

## 2022-05-16 DIAGNOSIS — R2681 Unsteadiness on feet: Secondary | ICD-10-CM | POA: Diagnosis not present

## 2022-05-16 DIAGNOSIS — M545 Low back pain, unspecified: Secondary | ICD-10-CM | POA: Diagnosis not present

## 2022-05-16 DIAGNOSIS — S22080A Wedge compression fracture of T11-T12 vertebra, initial encounter for closed fracture: Secondary | ICD-10-CM | POA: Diagnosis not present

## 2022-05-19 DIAGNOSIS — S22080A Wedge compression fracture of T11-T12 vertebra, initial encounter for closed fracture: Secondary | ICD-10-CM | POA: Diagnosis not present

## 2022-05-19 DIAGNOSIS — M545 Low back pain, unspecified: Secondary | ICD-10-CM | POA: Diagnosis not present

## 2022-05-19 DIAGNOSIS — R2681 Unsteadiness on feet: Secondary | ICD-10-CM | POA: Diagnosis not present

## 2022-05-19 DIAGNOSIS — M6281 Muscle weakness (generalized): Secondary | ICD-10-CM | POA: Diagnosis not present

## 2022-05-20 DIAGNOSIS — H5203 Hypermetropia, bilateral: Secondary | ICD-10-CM | POA: Diagnosis not present

## 2022-05-20 DIAGNOSIS — H2511 Age-related nuclear cataract, right eye: Secondary | ICD-10-CM | POA: Diagnosis not present

## 2022-05-20 DIAGNOSIS — H524 Presbyopia: Secondary | ICD-10-CM | POA: Diagnosis not present

## 2022-05-20 DIAGNOSIS — H52223 Regular astigmatism, bilateral: Secondary | ICD-10-CM | POA: Diagnosis not present

## 2022-05-20 DIAGNOSIS — H25812 Combined forms of age-related cataract, left eye: Secondary | ICD-10-CM | POA: Diagnosis not present

## 2022-05-20 DIAGNOSIS — E119 Type 2 diabetes mellitus without complications: Secondary | ICD-10-CM | POA: Diagnosis not present

## 2022-05-23 DIAGNOSIS — S22080A Wedge compression fracture of T11-T12 vertebra, initial encounter for closed fracture: Secondary | ICD-10-CM | POA: Diagnosis not present

## 2022-05-23 DIAGNOSIS — M6281 Muscle weakness (generalized): Secondary | ICD-10-CM | POA: Diagnosis not present

## 2022-05-23 DIAGNOSIS — M545 Low back pain, unspecified: Secondary | ICD-10-CM | POA: Diagnosis not present

## 2022-05-23 DIAGNOSIS — R2681 Unsteadiness on feet: Secondary | ICD-10-CM | POA: Diagnosis not present

## 2022-05-26 DIAGNOSIS — S22080A Wedge compression fracture of T11-T12 vertebra, initial encounter for closed fracture: Secondary | ICD-10-CM | POA: Diagnosis not present

## 2022-05-26 DIAGNOSIS — R2681 Unsteadiness on feet: Secondary | ICD-10-CM | POA: Diagnosis not present

## 2022-05-26 DIAGNOSIS — M545 Low back pain, unspecified: Secondary | ICD-10-CM | POA: Diagnosis not present

## 2022-05-26 DIAGNOSIS — M6281 Muscle weakness (generalized): Secondary | ICD-10-CM | POA: Diagnosis not present

## 2022-05-30 ENCOUNTER — Encounter: Payer: Self-pay | Admitting: Family Medicine

## 2022-05-30 ENCOUNTER — Ambulatory Visit (INDEPENDENT_AMBULATORY_CARE_PROVIDER_SITE_OTHER): Payer: Medicare Other | Admitting: Family Medicine

## 2022-05-30 ENCOUNTER — Encounter: Payer: Self-pay | Admitting: Oncology

## 2022-05-30 VITALS — BP 128/60 | HR 83 | Temp 98.8°F | Wt 187.0 lb

## 2022-05-30 DIAGNOSIS — J0191 Acute recurrent sinusitis, unspecified: Secondary | ICD-10-CM

## 2022-05-30 MED ORDER — CEFUROXIME AXETIL 500 MG PO TABS: 500.0000 mg | ORAL_TABLET | Freq: Two times a day (BID) | ORAL | 0 refills | Status: AC

## 2022-05-30 NOTE — Progress Notes (Signed)
   Subjective:    Patient ID: Rachel Coffey, female    DOB: [birth year], 71 y.o.   MRN: [id number]  HPI Here to follow up recurrent upper respiratory infections. We saw her on 04-12-22 for a bronchitis, and we treated this with a Zpack. She seems to improve but she she never totally recovered. Then she saw [place] on 05-11-22 for a sinusitis, and she was given a course of Doxycycline. Again she improved but never totally got over it. Today she has sinus pressure, PND, and is coughing up green sputum. No fever or SOB.    Review of Systems  Constitutional: Negative.   HENT:  Positive for congestion, postnasal drip, sinus pressure and sore throat. Negative for ear pain.   Eyes: Negative.   Respiratory:  Positive for cough. Negative for chest tightness, shortness of breath and wheezing.        Objective:   Physical Exam Constitutional:      Appearance: Normal appearance. She is not ill-appearing.  HENT:     Right Ear: Tympanic membrane, ear canal and external ear normal.     Left Ear: Tympanic membrane, ear canal and external ear normal.     Nose: Nose normal.     Mouth/Throat:     Pharynx: Oropharynx is clear.  Eyes:     Conjunctiva/sclera: Conjunctivae normal.  Pulmonary:     Effort: Pulmonary effort is normal.     Breath sounds: Normal breath sounds.  Lymphadenopathy:     Cervical: No cervical adenopathy.  Neurological:     Mental Status: She is alert.           Assessment & Plan:  Partially treated sinusitis . We will start her on a 21 day course of Cefuroxime 500 mg BID. Recheck as needed.  Alysia Penna, MD

## 2022-05-31 ENCOUNTER — Encounter (INDEPENDENT_AMBULATORY_CARE_PROVIDER_SITE_OTHER): Payer: Self-pay

## 2022-06-06 DIAGNOSIS — M545 Low back pain, unspecified: Secondary | ICD-10-CM | POA: Diagnosis not present

## 2022-06-06 DIAGNOSIS — M6281 Muscle weakness (generalized): Secondary | ICD-10-CM | POA: Diagnosis not present

## 2022-06-06 DIAGNOSIS — R2681 Unsteadiness on feet: Secondary | ICD-10-CM | POA: Diagnosis not present

## 2022-06-06 DIAGNOSIS — S22080A Wedge compression fracture of T11-T12 vertebra, initial encounter for closed fracture: Secondary | ICD-10-CM | POA: Diagnosis not present

## 2022-06-07 ENCOUNTER — Encounter: Payer: Self-pay | Admitting: Podiatry

## 2022-06-07 ENCOUNTER — Ambulatory Visit (INDEPENDENT_AMBULATORY_CARE_PROVIDER_SITE_OTHER): Payer: Medicare Other | Admitting: Podiatry

## 2022-06-07 DIAGNOSIS — M76821 Posterior tibial tendinitis, right leg: Secondary | ICD-10-CM | POA: Diagnosis not present

## 2022-06-09 ENCOUNTER — Encounter: Payer: Self-pay | Admitting: Podiatry

## 2022-06-09 DIAGNOSIS — M545 Low back pain, unspecified: Secondary | ICD-10-CM | POA: Diagnosis not present

## 2022-06-09 DIAGNOSIS — M6281 Muscle weakness (generalized): Secondary | ICD-10-CM | POA: Diagnosis not present

## 2022-06-09 DIAGNOSIS — S22080A Wedge compression fracture of T11-T12 vertebra, initial encounter for closed fracture: Secondary | ICD-10-CM | POA: Diagnosis not present

## 2022-06-09 DIAGNOSIS — R2681 Unsteadiness on feet: Secondary | ICD-10-CM | POA: Diagnosis not present

## 2022-06-09 NOTE — Progress Notes (Signed)
Subjective:  Patient ID: Rachel Coffey, female    DOB: August 15, 1951,  MRN: [id number]  Chief Complaint  Patient presents with   Foot Pain    71 y.o. female presents with the above complaint.  Patient presents with complaint of right medial foot pain.  Patient states is sore again.  She request another injection.  She states is painful to touch painful to walk on has progressed gotten worse.  She denies any other acute complaints.  She was doing good overall since last October.   Review of Systems: Negative except as noted in the HPI. Denies N/V/F/Ch.  Past Medical History:  Diagnosis Date   Asthma    stress induced with allergies   Cancer ([place])    CLL (chronic lymphocytic leukemia) ([place])    sees Dr. Benay Spice   Compression fracture of thoracic vertebra ([place])    at T11 and T12, sees Dr. Kary Kos   Genital warts    Hyperlipidemia    MS (multiple sclerosis) [facility])    sees Dr. Otto Herb of [facility] at the [facility] office   Type 2 diabetes mellitus [facility])     Current Outpatient Medications:    acetaminophen (TYLENOL) 500 MG tablet, Take 500 mg by mouth as needed., Disp: , Rfl:    ALPRAZolam (XANAX) 0.5 MG tablet, Take 0.5 mg by mouth daily as needed (for pain). , Disp: , Rfl:    baclofen (LIORESAL) 10 MG tablet, Take 5 mg by mouth 3 (three) times daily as needed., Disp: , Rfl:    cefUROXime (CEFTIN) 500 MG tablet, Take 1 tablet (500 mg total) by mouth 2 (two) times daily with a meal for 21 days., Disp: 42 tablet, Rfl: 0   Cholecalciferol (VITAMIN D) 50 MCG (2000 UT) CAPS, Take 2,000 Units by mouth daily. , Disp: , Rfl:    diclofenac Sodium (VOLTAREN) 1 % GEL, Apply 1 application. topically 4 (four) times daily as needed (pain)., Disp: , Rfl:    esomeprazole (NEXIUM) 20 MG capsule, Take 20 mg by mouth daily., Disp: , Rfl:    fluticasone (FLONASE) 50 MCG/ACT nasal spray, Place 1 spray into both nostrils daily as needed for allergies. Sometimes bid, Disp: , Rfl:     gabapentin (NEURONTIN) 300 MG capsule, Take 1 capsule (300 mg total) by mouth 2 (two) times daily., Disp: 60 capsule, Rfl: 5   glipiZIDE (GLUCOTROL XL) 2.5 MG 24 hr tablet, Take 1 tablet (2.5 mg total) by mouth 2 (two) times daily., Disp: 180 tablet, Rfl: 3   glucose blood (CONTOUR NEXT TEST) test strip, Use as instructed, Disp: 100 each, Rfl: 0   ibrutinib (IMBRUVICA) 420 MG tablet, TAKE 1 TABLET (420 MG) BY MOUTH DAILY. ADMINISTER WITH WATER AT APPROXIMATELY THE SAME TIME EACH DAY., Disp: 28 tablet, Rfl: 3   Lancets Misc. MISC, 1 each by Does not apply route as directed., Disp: 100 each, Rfl: 0   lidocaine (LIDODERM) 5 %, Place 1 patch onto the skin daily. Remove & Discard patch within 12 hours or as directed by MD, Disp: 30 patch, Rfl: 5   loratadine (CLARITIN) 10 MG tablet, Take 10 mg by mouth daily as needed (allergies)., Disp: , Rfl:    losartan (COZAAR) 25 MG tablet, TAKE ONE TABLET ('25MG'$  TOTAL) BY MOUTH DAILY, Disp: 90 tablet, Rfl: 3   meloxicam (MOBIC) 15 MG tablet, TAKE ONE (1) TABLET BY MOUTH EVERY DAY AS NEEDED FOR KNEE PAIN, Disp: 30 tablet, Rfl: 0   simvastatin (ZOCOR) 20 MG tablet,  TAKE ONE TABLET ('20MG'$  TOTAL) BY MOUTH ATBEDTIME, Disp: 90 tablet, Rfl: 3   VENTOLIN HFA 108 (90 Base) MCG/ACT inhaler, INHALE 2 PUFFS EVERY 4 TO 6 HOURS AS NEEDED. SHAKE WELL BEFORE EACH USE., Disp: 18 g, Rfl: 5  Social History   Tobacco Use  Smoking Status Former   Types: Cigarettes   Quit date: 01/31/2005   Years since quitting: 17.3  Smokeless Tobacco Never    Allergies  Allergen Reactions   Amoxicillin-Pot Clavulanate Other (See Comments)    Pt preferred - causes upset stomach    Codeine Itching   Fenofibrate     aching joints   Hydrocodone Itching   Hydrocodone-Acetaminophen Itching   Lisinopril Cough   Metformin And Related     Makes pt deathly ill   Objective:  There were no vitals filed for this visit. There is no height or weight on file to calculate BMI. Constitutional Well  developed. Well nourished.  Vascular Dorsalis pedis pulses palpable bilaterally. Posterior tibial pulses palpable bilaterally. Capillary refill normal to all digits.  No cyanosis or clubbing noted. Pedal hair growth normal.  Neurologic Normal speech. Oriented to person, place, and time. Epicritic sensation to light touch grossly present bilaterally.  Dermatologic Nails well groomed and normal in appearance. No open wounds. No skin lesions.  Orthopedic: Pain on palpation on the course of the posterior tibial tendon including the insertion.  Pain with resisted plantarflexion inversion of the foot.  No pain with dorsiflexion eversion of the foot.   Radiographs: None Assessment:   1. Posterior tibial tendinitis, right     Plan:  Patient was evaluated and treated and all questions answered.  Right posterior tibial tendinitis -I explained the patient the etiology of tendinitis and various treatment options were discussed.  I discussed with her shoe gear modification and the importance of them.  Given the amount of pain that she is having I believe she will benefit from a steroid injection to help decrease acute inflammatory component associate with pain.  I discussed with her given that this is near the tendon there is a risk of rupture associated with it.  She states understanding and would like to proceed with injection -Another steroid injection was performed at Right medial foot at point of maximal tenderness using 1% plain Lidocaine and 10 mg of Kenalog posterior tibial tendinitis right. This was well tolerated.    No follow-ups on file.

## 2022-06-16 DIAGNOSIS — R2681 Unsteadiness on feet: Secondary | ICD-10-CM | POA: Diagnosis not present

## 2022-06-16 DIAGNOSIS — M545 Low back pain, unspecified: Secondary | ICD-10-CM | POA: Diagnosis not present

## 2022-06-16 DIAGNOSIS — M6281 Muscle weakness (generalized): Secondary | ICD-10-CM | POA: Diagnosis not present

## 2022-06-16 DIAGNOSIS — S22080A Wedge compression fracture of T11-T12 vertebra, initial encounter for closed fracture: Secondary | ICD-10-CM | POA: Diagnosis not present

## 2022-06-28 ENCOUNTER — Telehealth: Payer: Self-pay | Admitting: Oncology

## 2022-06-28 DIAGNOSIS — L308 Other specified dermatitis: Secondary | ICD-10-CM | POA: Diagnosis not present

## 2022-06-28 DIAGNOSIS — L304 Erythema intertrigo: Secondary | ICD-10-CM | POA: Diagnosis not present

## 2022-06-28 DIAGNOSIS — L309 Dermatitis, unspecified: Secondary | ICD-10-CM | POA: Diagnosis not present

## 2022-06-28 NOTE — Telephone Encounter (Signed)
Attempted to contact patient in regards to reschedule appointment due to Dr. Benay Spice being out of office

## 2022-07-07 ENCOUNTER — Encounter: Payer: Self-pay | Admitting: Family Medicine

## 2022-07-07 ENCOUNTER — Encounter: Payer: Self-pay | Admitting: Oncology

## 2022-07-07 DIAGNOSIS — M6281 Muscle weakness (generalized): Secondary | ICD-10-CM | POA: Diagnosis not present

## 2022-07-07 DIAGNOSIS — R2681 Unsteadiness on feet: Secondary | ICD-10-CM | POA: Diagnosis not present

## 2022-07-07 DIAGNOSIS — M545 Low back pain, unspecified: Secondary | ICD-10-CM | POA: Diagnosis not present

## 2022-07-07 DIAGNOSIS — S22080A Wedge compression fracture of T11-T12 vertebra, initial encounter for closed fracture: Secondary | ICD-10-CM | POA: Diagnosis not present

## 2022-07-08 ENCOUNTER — Encounter: Payer: Self-pay | Admitting: Family Medicine

## 2022-07-12 ENCOUNTER — Telehealth: Payer: Self-pay | Admitting: Pharmacy Technician

## 2022-07-12 NOTE — Telephone Encounter (Signed)
Oral Oncology Patient Advocate Encounter   Was successful in securing patient a $9,700 grant from Patient [facility]) to provide continued coverage for Imbruvica.  This will keep the out of pocket expense at $0.    The billing information is as follows and has been shared with [place].   Member ID: [id number] Group ID: [id number] RxBin: [id number] Dates of Eligibility: 09/24/223 through 08/07/2023  Fund:  [place], CPhT-Adv Oncology Pharmacy Patient [place] Direct Number: [id number]  Fax: [phone]

## 2022-07-14 ENCOUNTER — Other Ambulatory Visit (HOSPITAL_COMMUNITY): Payer: Self-pay

## 2022-07-15 ENCOUNTER — Inpatient Hospital Stay: Payer: Medicare Other | Attending: Oncology | Admitting: Oncology

## 2022-07-15 ENCOUNTER — Inpatient Hospital Stay: Payer: Medicare Other

## 2022-07-15 VITALS — BP 136/54 | HR 76 | Temp 98.1°F | Resp 18 | Ht 67.0 in | Wt 186.8 lb

## 2022-07-15 DIAGNOSIS — Z85820 Personal history of malignant melanoma of skin: Secondary | ICD-10-CM | POA: Insufficient documentation

## 2022-07-15 DIAGNOSIS — E119 Type 2 diabetes mellitus without complications: Secondary | ICD-10-CM | POA: Diagnosis not present

## 2022-07-15 DIAGNOSIS — G35 Multiple sclerosis: Secondary | ICD-10-CM | POA: Diagnosis not present

## 2022-07-15 DIAGNOSIS — J449 Chronic obstructive pulmonary disease, unspecified: Secondary | ICD-10-CM | POA: Diagnosis not present

## 2022-07-15 DIAGNOSIS — S22088D Other fracture of T11-T12 vertebra, subsequent encounter for fracture with routine healing: Secondary | ICD-10-CM | POA: Insufficient documentation

## 2022-07-15 DIAGNOSIS — C9110 Chronic lymphocytic leukemia of B-cell type not having achieved remission: Secondary | ICD-10-CM | POA: Diagnosis not present

## 2022-07-15 LAB — CMP (CANCER CENTER ONLY)
ALT: 11 U/L (ref 0–44)
AST: 14 U/L — ABNORMAL LOW (ref 15–41)
Albumin: 4.6 g/dL (ref 3.5–5.0)
Alkaline Phosphatase: 58 U/L (ref 38–126)
Anion gap: 7 (ref 5–15)
BUN: 18 mg/dL (ref 8–23)
CO2: 31 mmol/L (ref 22–32)
Calcium: 9.4 mg/dL (ref 8.9–10.3)
Chloride: 103 mmol/L (ref 98–111)
Creatinine: 0.85 mg/dL (ref 0.44–1.00)
GFR, Estimated: >60 mL/min (ref >60)
Glucose, Bld: 98 mg/dL (ref 70–99)
Potassium: 4.8 mmol/L (ref 3.5–5.1)
Sodium: 141 mmol/L (ref 135–145)
Total Bilirubin: 0.5 mg/dL (ref 0.3–1.2)
Total Protein: 6.5 g/dL (ref 6.5–8.1)

## 2022-07-15 LAB — CBC WITH DIFFERENTIAL (CANCER CENTER ONLY)
Abs Immature Granulocytes: 0.02 10*3/uL (ref 0.00–0.07)
Basophils Absolute: 0.1 10*3/uL (ref 0.0–0.1)
Basophils Relative: 1 %
Eosinophils Absolute: 0.2 10*3/uL (ref 0.0–0.5)
Eosinophils Relative: 3 %
HCT: 44.8 % (ref 36.0–46.0)
Hemoglobin: 14.4 g/dL (ref 12.0–15.0)
Immature Granulocytes: 0 %
Lymphocytes Relative: 23 %
Lymphs Abs: 1.8 10*3/uL (ref 0.7–4.0)
MCH: 29.1 pg (ref 26.0–34.0)
MCHC: 32.1 g/dL (ref 30.0–36.0)
MCV: 90.5 fL (ref 80.0–100.0)
Monocytes Absolute: 0.7 10*3/uL (ref 0.1–1.0)
Monocytes Relative: 9 %
Neutro Abs: 5.3 10*3/uL (ref 1.7–7.7)
Neutrophils Relative %: 64 %
Platelet Count: 176 10*3/uL (ref 150–400)
RBC: 4.95 MIL/uL (ref 3.87–5.11)
RDW: 12.4 % (ref 11.5–15.5)
WBC Count: 8.1 10*3/uL (ref 4.0–10.5)
nRBC: 0 % (ref 0.0–0.2)

## 2022-07-15 LAB — LACTATE DEHYDROGENASE: LDH: 128 U/L (ref 98–192)

## 2022-07-15 NOTE — Progress Notes (Signed)
[facility] OFFICE PROGRESS NOTE   Diagnosis: CLL  INTERVAL HISTORY:   Rachel Coffey returns as scheduled.  She reports a prolonged sinus infection beginning in May.  She received several courses of antibiotics.  She has developed skin lesions near the umbilicus, right elbow, and lower back.  She has seen dermatology and has been diagnosed with dermatitis.  She been referred to an allergist.  No abdominal pain.  No palpable lymph nodes.  No fever or night sweats.  She bruises easily.  Objective:  Vital signs in last 24 hours:  Blood pressure (!) 136/54, pulse 76, temperature 98.1 F (36.7 C), temperature source Oral, resp. rate 18, height _0  (1.702 m), weight 186 lb 12.8 oz (84.7 kg), SpO2 100 %.    HEENT: No thrush.  Tiny ecchymoses at the bilateral buccal mucosa Lymphatics: No cervical, supraclavicular, axillary, or inguinal nodes Resp: Lungs clear bilaterally Cardio: Regular rate and rhythm GI: No hepatosplenomegaly, nontender, no mass Vascular: No leg edema  Skin: 1-3 cm demarcated erythematous slightly raised lesions at the bilateral lower back   Lab Results:  Lab Results  Component Value Date   WBC 8.1 07/15/2022   HGB 14.4 07/15/2022   HCT 44.8 07/15/2022   MCV 90.5 07/15/2022   PLT 176 07/15/2022   NEUTROABS 5.3 07/15/2022    CMP  Lab Results  Component Value Date   NA 141 07/15/2022   K 4.8 07/15/2022   CL 103 07/15/2022   CO2 31 07/15/2022   GLUCOSE 98 07/15/2022   BUN 18 07/15/2022   CREATININE 0.85 07/15/2022   CALCIUM 9.4 07/15/2022   PROT 6.5 07/15/2022   ALBUMIN 4.6 07/15/2022   AST 14 (L) 07/15/2022   ALT 11 07/15/2022   ALKPHOS 58 07/15/2022   BILITOT 0.5 07/15/2022   GFRNONAA >60 07/15/2022   GFRAA >60 06/22/2020     Medications: I have reviewed the patient's current medications.   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 06/20/2016 Monoclonal Kappa  restricted B-cell population identified on flow cytometry Staging PET scan 06/13/2016 confirmed hypermetabolic abdominal/pelvic lymphadenopathy FISH panel positive for 11q-,-12,13q-, and 17p-(10% of cells) Peripheral blood flow cytometry 09/22/2016 at [id number]-[id number]/[id number] positive For restricted B-cell population, 2% of lymphocytes Peripheral blood IGH mutation analysis at [facility] 09/22/2016-unmutated CT 06/06/2017-generally stable diffuse lymphadenopathy, node in the pelvis is smaller, some the lymph nodes are larger CTs 07/02/2018- slight decrease in size of retrocrural, retroperitoneal, and mesenteric adenopathy, new 6 mm left upper lobe nodule CT chest 01/01/2019- increase in size of left upper lobe lung nodule-inflammatory appearance, other stable lung nodules, right retrocrural and upper abdominal adenopathy has progressed CTs 07/04/2019- progressive lower thoracic and abdominal adenopathy with dominant mass-effect on the stomach and pancreas, new heterogeneity in the spleen- possible splenic lesion, stable lung nodules Ibrutinib starting 08/14/2019 CT abdomen/pelvis 11/19/2019-decreased size of dominant abdominal mass and decreased abdominal lymphadenopathy CTs 06/22/2020-2 upper abdominal lymph nodes have decreased in size, no evidence of disease progression, unchanged bilateral lung nodules   Stage IB (T1b,N0) melanoma of the right forearm, status post a wide excision and sentinel lymph node biopsy at [facility] March 2017     3.    Multiple sclerosis   4.    Diabetes   5.   COPD  6.  T12 compression fracture after a fall May 22, T12 kyphoplasty 05/07/2021      Disposition: Rachel Coffey appears stable.  She appears asymptomatic from the CLL.  She will continue ibrutinib.  We have decided against restaging CTs unless she develops new symptoms or a recurrent abdominal mass.  She will remain up-to-date on influenza and pneumonia vaccines.  [name] will return for an office visit in 6 months.  I  doubt the skin lesions are related to ibrutinib.  She plans to continue follow-up with dermatology and an allergist.  [name], MD  07/15/2022  12:06 PM

## 2022-07-19 NOTE — Telephone Encounter (Signed)
I just answered her first email. Yes I can refer her to see Dr. Orvil Feil (at [facility]) if she wishes

## 2022-07-19 NOTE — Telephone Encounter (Signed)
I read the biopsy report. It sounds like these are allergic in nature. She should follow up with Dr. Delman Cheadle for this

## 2022-07-26 ENCOUNTER — Ambulatory Visit (INDEPENDENT_AMBULATORY_CARE_PROVIDER_SITE_OTHER): Payer: Medicare Other

## 2022-07-26 VITALS — Ht 67.0 in | Wt 186.0 lb

## 2022-07-26 DIAGNOSIS — Z0000 Encounter for general adult medical examination without abnormal findings: Secondary | ICD-10-CM

## 2022-07-26 NOTE — Patient Instructions (Addendum)
Rachel Coffey , Thank you for taking time to come for your Medicare Wellness Visit. I appreciate your ongoing commitment to your health goals. Please review the following plan we discussed and let me know if I can assist you in the future.   Screening recommendations/referrals: Colonoscopy: Done 04/24/13 Repeat 10 Yr Mammogram: Done 10/18/21 Repeat 1 Yr Bone Density: Deferred Recommended yearly ophthalmology/optometry visit for glaucoma screening and checkup Recommended yearly dental visit for hygiene and checkup  Vaccinations: Influenza vaccine: Up to date Pneumococcal vaccine: Due Tdap vaccine: Up to date    Covid-19:Done  Advanced directives: Copy on file  Conditions/risks identified: None  Next appointment: Follow up in one year for your annual wellness visit     Preventive Care 21 Years and Older, Female Preventive care refers to lifestyle choices and visits with your health care provider that can promote health and wellness. What does preventive care include? A yearly physical exam. This is also called an annual well check. Dental exams once or twice a year. Routine eye exams. Ask your health care provider how often you should have your eyes checked. Personal lifestyle choices, including: Daily care of your teeth and gums. Regular physical activity. Eating a healthy diet. Avoiding tobacco and drug use. Limiting alcohol use. Practicing safe sex. Taking low-dose aspirin every day. Taking vitamin and mineral supplements as recommended by your health care provider. What happens during an annual well check? The services and screenings done by your health care provider during your annual well check will depend on your age, overall health, lifestyle risk factors, and family history of disease. Counseling  Your health care provider may ask you questions about your: Alcohol use. Tobacco use. Drug use. Emotional well-being. Home and relationship well-being. Sexual  activity. Eating habits. History of falls. Memory and ability to understand (cognition). Work and work Statistician. Reproductive health. Screening  You may have the following tests or measurements: Height, weight, and BMI. Blood pressure. Lipid and cholesterol levels. These may be checked every 5 years, or more frequently if you are over 66 years old. Skin check. Lung cancer screening. You may have this screening every year starting at age 87 if you have a 30-pack-year history of smoking and currently smoke or have quit within the past 15 years. Fecal occult blood test (FOBT) of the stool. You may have this test every year starting at age 29. Flexible sigmoidoscopy or colonoscopy. You may have a sigmoidoscopy every 5 years or a colonoscopy every 10 years starting at age 13. Hepatitis C blood test. Hepatitis B blood test. Sexually transmitted disease (STD) testing. Diabetes screening. This is done by checking your blood sugar (glucose) after you have not eaten for a while (fasting). You may have this done every 1-3 years. Bone density scan. This is done to screen for osteoporosis. You may have this done starting at age 32. Mammogram. This may be done every 1-2 years. Talk to your health care provider about how often you should have regular mammograms. Talk with your health care provider about your test results, treatment options, and if necessary, the need for more tests. Vaccines  Your health care provider may recommend certain vaccines, such as: Influenza vaccine. This is recommended every year. Tetanus, diphtheria, and acellular pertussis (Tdap, Td) vaccine. You may need a Td booster every 10 years. Zoster vaccine. You may need this after age 66. Pneumococcal 13-valent conjugate (PCV13) vaccine. One dose is recommended after age 82. Pneumococcal polysaccharide (PPSV23) vaccine. One dose is recommended after age  37. Talk to your health care provider about which screenings and vaccines  you need and how often you need them. This information is not intended to replace advice given to you by your health care provider. Make sure you discuss any questions you have with your health care provider. Document Released: 11/27/2015 Document Revised: 07/20/2016 Document Reviewed: 09/01/2015 Elsevier Interactive Patient Education  2017 [employer] Prevention in the Home Falls can cause injuries. They can happen to people of all ages. There are many things you can do to make your home safe and to help prevent falls. What can I do on the outside of my home? Regularly fix the edges of walkways and driveways and fix any cracks. Remove anything that might make you trip as you walk through a door, such as a raised step or threshold. Trim any bushes or trees on the path to your home. Use bright outdoor lighting. Clear any walking paths of anything that might make someone trip, such as rocks or tools. Regularly check to see if handrails are loose or broken. Make sure that both sides of any steps have handrails. Any raised decks and porches should have guardrails on the edges. Have any leaves, snow, or ice cleared regularly. Use sand or salt on walking paths during winter. Clean up any spills in your garage right away. This includes oil or grease spills. What can I do in the bathroom? Use night lights. Install grab bars by the toilet and in the tub and shower. Do not use towel bars as grab bars. Use non-skid mats or decals in the tub or shower. If you need to sit down in the shower, use a plastic, non-slip stool. Keep the floor dry. Clean up any water that spills on the floor as soon as it happens. Remove soap buildup in the tub or shower regularly. Attach bath mats securely with double-sided non-slip rug tape. Do not have throw rugs and other things on the floor that can make you trip. What can I do in the bedroom? Use night lights. Make sure that you have a light by your bed that  is easy to reach. Do not use any sheets or blankets that are too big for your bed. They should not hang down onto the floor. Have a firm chair that has side arms. You can use this for support while you get dressed. Do not have throw rugs and other things on the floor that can make you trip. What can I do in the kitchen? Clean up any spills right away. Avoid walking on wet floors. Keep items that you use a lot in easy-to-reach places. If you need to reach something above you, use a strong step stool that has a grab bar. Keep electrical cords out of the way. Do not use floor polish or wax that makes floors slippery. If you must use wax, use non-skid floor wax. Do not have throw rugs and other things on the floor that can make you trip. What can I do with my stairs? Do not leave any items on the stairs. Make sure that there are handrails on both sides of the stairs and use them. Fix handrails that are broken or loose. Make sure that handrails are as long as the stairways. Check any carpeting to make sure that it is firmly attached to the stairs. Fix any carpet that is loose or worn. Avoid having throw rugs at the top or bottom of the stairs. If you do have  throw rugs, attach them to the floor with carpet tape. Make sure that you have a light switch at the top of the stairs and the bottom of the stairs. If you do not have them, ask someone to add them for you. What else can I do to help prevent falls? Wear shoes that: Do not have high heels. Have rubber bottoms. Are comfortable and fit you well. Are closed at the toe. Do not wear sandals. If you use a stepladder: Make sure that it is fully opened. Do not climb a closed stepladder. Make sure that both sides of the stepladder are locked into place. Ask someone to hold it for you, if possible. Clearly mark and make sure that you can see: Any grab bars or handrails. First and last steps. Where the edge of each step is. Use tools that help you  move around (mobility aids) if they are needed. These include: Canes. Walkers. Scooters. Crutches. Turn on the lights when you go into a dark area. Replace any light bulbs as soon as they burn out. Set up your furniture so you have a clear path. Avoid moving your furniture around. If any of your floors are uneven, fix them. If there are any pets around you, be aware of where they are. Review your medicines with your doctor. Some medicines can make you feel dizzy. This can increase your chance of falling. Ask your doctor what other things that you can do to help prevent falls. This information is not intended to replace advice given to you by your health care provider. Make sure you discuss any questions you have with your health care provider. Document Released: 08/27/2009 Document Revised: 04/07/2016 Document Reviewed: 12/05/2014 Elsevier Interactive Patient Education  2017 [employer].

## 2022-07-26 NOTE — Progress Notes (Signed)
Subjective:   Rachel Coffey is a 71 y.o. female who presents for Medicare Annual (Subsequent) preventive examination.  Review of Systems    Virtual Visit via Telephone Note  I connected with  Rachel Coffey on 07/26/22 at 11:30 AM EDT by telephone and verified that I am speaking with the correct person using two identifiers.  Location: Patient: Home Provider: Office Persons participating in the virtual visit: patient/Nurse Health Advisor   I discussed the limitations, risks, security and privacy concerns of performing an evaluation and management service by telephone and the availability of in person appointments. The patient expressed understanding and agreed to proceed.  Interactive audio and video telecommunications were attempted between this nurse and patient, however failed, due to patient having technical difficulties OR patient did not have access to video capability.  We continued and completed visit with audio only.  Some vital signs may be absent or patient reported.   Criselda Peaches, LPN  Cardiac Risk Factors include: advanced age (>60mn, >>24women);obesity (BMI >30kg/m2)     Objective:    Today's Vitals   07/26/22 1142  Weight: 186 lb (84.4 kg)  Height: '5\' 7"'$  (1.702 m)  PainSc: 0-No pain   Body mass index is 29.13 kg/m.     07/26/2022   11:51 AM 07/15/2022   12:06 PM 07/15/2022   11:56 AM 01/21/2022   11:10 AM 05/24/2021   10:18 AM 05/07/2021    6:28 AM 01/22/2021   11:38 AM  Advanced Directives  Does Patient Have a Medical Advance Directive? Yes Yes No No Yes Yes Yes  Type of AParamedicof AWintersLiving will HAvon-by-the-SeaLiving will  [place]Living will Living will;Healthcare Power of [place]Living will [place]Jackson[place] BeachLiving will  Does patient want to make changes to medical advance directive? No - Patient declined No - Patient declined No -  Patient declined No - Patient declined  No - Patient declined No - Patient declined  Copy of [place] Chart? Yes - validated most recent copy scanned in chart (See row information) Yes - validated most recent copy scanned in chart (See row information)  No - copy requested  No - copy requested Yes - validated most recent copy scanned in chart (See row information)  Would patient like information on creating a medical advance directive?     No - Patient declined      Current Medications (verified) Outpatient Encounter Medications as of 07/26/2022  Medication Sig   acetaminophen (TYLENOL) 500 MG tablet Take 500 mg by mouth as needed.   ALPRAZolam (XANAX) 0.5 MG tablet Take 0.5 mg by mouth daily as needed (for pain).    baclofen (LIORESAL) 10 MG tablet Take 5 mg by mouth 3 (three) times daily as needed.   Cholecalciferol (VITAMIN D) 50 MCG (2000 UT) CAPS Take 2,000 Units by mouth daily.    clobetasol ointment (TEMOVATE) 0.05 % Apply topically 2 (two) times daily. x14 days   diclofenac Sodium (VOLTAREN) 1 % GEL Apply 1 application. topically 4 (four) times daily as needed (pain).   esomeprazole (NEXIUM) 20 MG capsule Take 20 mg by mouth daily.   fluticasone (FLONASE) 50 MCG/ACT nasal spray Place 1 spray into both nostrils daily as needed for allergies. Sometimes bid   gabapentin (NEURONTIN) 300 MG capsule Take 1 capsule (300 mg total) by mouth 2 (two) times daily.   glipiZIDE (GLUCOTROL XL) 2.5 MG 24  hr tablet Take 1 tablet (2.5 mg total) by mouth 2 (two) times daily.   glucose blood (CONTOUR NEXT TEST) test strip Use as instructed   ibrutinib (IMBRUVICA) 420 MG tablet TAKE 1 TABLET (420 MG) BY MOUTH DAILY. ADMINISTER WITH WATER AT APPROXIMATELY THE SAME TIME EACH DAY.   Lancets Misc. MISC 1 each by Does not apply route as directed.   levocetirizine (XYZAL ALLERGY 24HR) 5 MG tablet Take 5 mg by mouth every evening. X 14 days   loratadine (CLARITIN) 10 MG tablet Take 10 mg by  mouth daily as needed (allergies). (Patient not taking: Reported on 07/15/2022)   losartan (COZAAR) 25 MG tablet TAKE ONE TABLET ('25MG'$  TOTAL) BY MOUTH DAILY   meloxicam (MOBIC) 15 MG tablet TAKE ONE (1) TABLET BY MOUTH EVERY DAY AS NEEDED FOR KNEE PAIN   simvastatin (ZOCOR) 20 MG tablet TAKE ONE TABLET ('20MG'$  TOTAL) BY MOUTH ATBEDTIME   VENTOLIN HFA 108 (90 Base) MCG/ACT inhaler INHALE 2 PUFFS EVERY 4 TO 6 HOURS AS NEEDED. SHAKE WELL BEFORE EACH USE.   No facility-administered encounter medications on file as of 07/26/2022.    Allergies (verified) Amoxicillin-pot clavulanate, Codeine, Fenofibrate, Hydrocodone, Hydrocodone-acetaminophen, Lisinopril, and Metformin and related   History: Past Medical History:  Diagnosis Date   Asthma    stress induced with allergies   Cancer ([place])    CLL (chronic lymphocytic leukemia) ([place])    sees Dr. Benay Spice   Compression fracture of thoracic vertebra ([place])    at T11 and T12, sees Dr. Kary Kos   Genital warts    Hyperlipidemia    MS (multiple sclerosis) ([place])    sees Dr. Otto Herb of [facility] at the [facility] office   Type 2 diabetes mellitus [facility])    Past Surgical History:  Procedure Laterality Date   ABDOMINAL HYSTERECTOMY     with 1 ovary removed-TVH   CHOLECYSTECTOMY N/A 02/25/2013   Procedure: LAPAROSCOPIC CHOLECYSTECTOMY WITH INTRAOPERATIVE CHOLANGIOGRAM;  Surgeon: Earnstine Regal, MD;  Location: [facility];  Service: General;  Laterality: N/A;   COLONOSCOPY  02/19/2013   per Dr. Teena Irani, clear, repeat in 10 yrs   IR KYPHO THORACIC WITH BONE BIOPSY  05/07/2021   TONSILLECTOMY     Family History  Problem Relation Age of Onset   COPD Mother    Heart disease Mother    Diabetes Father    Heart disease Sister    Social History   Socioeconomic History   Marital status: Married    Spouse name: Not on file   Number of children: Not on file   Years of education: Not on file   Highest education level: Some college, no degree   Occupational History   Not on file  Tobacco Use   Smoking status: Former    Types: Cigarettes    Quit date: 01/31/2005    Years since quitting: 17.4   Smokeless tobacco: Never  Vaping Use   Vaping Use: Never used  Substance and Sexual Activity   Alcohol use: No   Drug use: No   Sexual activity: Never    Partners: Male    Comment: TVH--still has 1 ovary  Other Topics Concern   Not on file  Social History Narrative   Not on file   Social Determinants of Health   Financial Resource Strain: Low Risk  (07/26/2022)   Overall Financial Resource Strain (CARDIA)    Difficulty of Paying Living Expenses: Not hard at all  Food Insecurity: No Food Insecurity (  07/26/2022)   Hunger Vital Sign    Worried About Running Out of Food in the Last Year: Never true    [place] in the Last Year: Never true  Transportation Needs: No Transportation Needs (07/26/2022)   PRAPARE - Hydrologist (Medical): No    Lack of Transportation (Non-Medical): No  Physical Activity: Sufficiently Active (07/26/2022)   Exercise Vital Sign    Days of Exercise per Week: 7 days    Minutes of Exercise per Session: 60 min  Stress: No Stress Concern Present (07/26/2022)   [place]    Feeling of Stress : Not at all  Social Connections: [place] (07/26/2022)   Social Connection and Isolation Panel [NHANES]    Frequency of Communication with Friends and Family: More than three times a week    Frequency of Social Gatherings with Friends and Family: More than three times a week    Attends Religious Services: More than 4 times per year    Active Member of [employer] or Organizations: Yes    Attends Music therapist: More than 4 times per year    Marital Status: Married    Tobacco Counseling Counseling given: Not Answered   Clinical Intake: Nutrition Risk Assessment:  Has the patient had any N/V/D  within the last 2 months?  No  Does the patient have any non-healing wounds?  No  Has the patient had any unintentional weight loss or weight gain?  No   Diabetes:  Is the patient diabetic?  Yes  If diabetic, was a CBG obtained today?  Yes  CBG 128 Taken by patient Did the patient bring in their glucometer from home?  No  How often do you monitor your CBG's? Daily.   Financial Strains and Diabetes Management:  Are you having any financial strains with the device, your supplies or your medication? No .  Does the patient want to be seen by Chronic Care Management for management of their diabetes?  No  Would the patient like to be referred to a Nutritionist or for Diabetic Management?  No   Diabetic Exams:  Diabetic Eye Exam: Completed No. Overdue for diabetic eye exam. Pt has been advised about the importance in completing this exam. A referral has been placed today. Message sent to referral coordinator for scheduling purposes. Advised pt to expect a call from office referred to regarding appt.  Diabetic Foot Exam: Completed No. Pt has been advised about the importance in completing this exam. Pt is scheduled for diabetic foot exam on Followed by PCP.   Pre-visit preparation completed: No  Diabetic?  Yes  Interpreter Needed?: No  Activities of Daily Living    07/26/2022   11:50 AM 11/17/2021    1:58 PM  In your present state of health, do you have any difficulty performing the following activities:  Hearing? 0 0  Vision? 0 0  Difficulty concentrating or making decisions? 0 0  Walking or climbing stairs? 0 0  Dressing or bathing? 0 0  Doing errands, shopping? 0 0  Preparing Food and eating ? N   Using the Toilet? N   In the past six months, have you accidently leaked urine? N   Do you have problems with loss of bowel control? N   Managing your Medications? N   Managing your Finances? N   Housekeeping or managing your Housekeeping? N     Patient Care Team:  Laurey Morale,  MD as PCP - General (Family Medicine) Ladell Pier, MD as Consulting Physician (Oncology)  Indicate any recent Medical Services you may have received from other than [facility] providers in the past year (date may be approximate).     Assessment:   This is a routine wellness examination for [place].  Hearing/Vision screen Hearing Screening - Comments:: Denies hearing difficulties   Vision Screening - Comments:: Wears rx glasses - up to date with routine eye exams with  Dr Bing Plume  Dietary issues and exercise activities discussed: Current Exercise Habits: Home exercise routine, Type of exercise: walking;stretching, Time (Minutes): 60, Frequency (Times/Week): 7, Weekly Exercise (Minutes/Week): 420, Exercise limited by: None identified   Goals Addressed               This Visit's Progress     Stay Healthy (pt-stated)         Depression Screen    07/26/2022   11:47 AM 05/30/2022    2:20 PM 04/27/2022    2:32 PM 04/12/2022    4:37 PM 02/16/2022    1:06 PM 01/19/2022    1:35 PM 11/17/2021    1:59 PM  PHQ 2/9 Scores  PHQ - 2 Score 0 0 0 0 0 0 0  PHQ- 9 Score 0 0 0 0 0 0 0    Fall Risk    07/26/2022   11:51 AM 05/30/2022    2:20 PM 04/27/2022    2:32 PM 04/12/2022    4:37 PM 02/16/2022    1:06 PM  [place] in the past year? 0 0 0 0 1  Number falls in past yr: 0 0 0 0 0  Injury with Fall? 0 0 0 0 1  Risk for fall due to : No Fall Risks No Fall Risks No Fall Risks No Fall Risks No Fall Risks  Follow up Falls prevention discussed Falls evaluation completed Falls evaluation completed Falls evaluation completed Falls evaluation completed    FALL RISK PREVENTION PERTAINING TO THE HOME:  Any stairs in or around the home? Yes  If so, are there any without handrails? No  Home free of loose throw rugs in walkways, pet beds, electrical cords, etc? Yes  Adequate lighting in your home to reduce risk of falls? Yes   ASSISTIVE DEVICES UTILIZED TO PREVENT FALLS:  Life alert? No  Use of  a cane, walker or w/c? No  Grab bars in the bathroom? No  Shower chair or bench in shower? Yes  Elevated toilet seat or a handicapped toilet? Yes   TIMED UP AND GO:  Was the test performed? No . Audio Visit  Cognitive Function:        Immunizations Immunization History  Administered Date(s) Administered   Influenza Split 08/15/2014, 08/17/2016, 08/22/2017, 08/13/2018, 08/01/2019, 08/17/2020   Influenza, High Dose Seasonal PF 08/13/2018, 07/31/2019   Influenza,inj,Quad PF,6+ Mos 08/03/2011, 08/22/2013   Influenza,inj,quad, With Preservative 09/11/2015   Influenza-Unspecified 08/22/2017, 09/04/2021   Moderna Sars-Covid-2 Vaccination 12/05/2019, 01/03/2020, 06/30/2020, 03/30/2021, 10/10/2021   Pneumococcal Conjugate-13 12/13/2016, 08/15/2018   Pneumococcal Polysaccharide-23 08/09/2003, 12/13/2016   Tdap 08/17/2010, 08/10/2020   Unspecified SARS-COV-2 Vaccination 07/01/2020   Zoster Recombinat (Shingrix) 07/08/2019   Zoster, Live 02/02/2015    TDAP status: Up to date  Flu Vaccine status: Up to date  Pneumococcal vaccine status: Due, Education has been provided regarding the importance of this vaccine. Advised may receive this vaccine at local pharmacy or Health Dept. Aware to provide a  copy of the vaccination record if obtained from local pharmacy or Health Dept. Verbalized acceptance and understanding.  Covid-19 vaccine status: Completed vaccines  Screening Tests Health Maintenance  Topic Date Due   FOOT EXAM  Never done   DEXA SCAN  Never done   HEMOGLOBIN A1C  05/23/2022   OPHTHALMOLOGY EXAM  08/11/2022 (Originally 08/09/1961)   COVID-19 Vaccine (7 - Moderna risk series) 08/11/2022 (Originally 12/05/2021)   INFLUENZA VACCINE  08/16/2022 (Originally 06/14/2022)   Zoster Vaccines- Shingrix (2 of 2) 05/12/2023 (Originally 09/02/2019)   Pneumonia Vaccine 1+ Years old (4 - PPSV23 or PCV20) 07/27/2023 (Originally 12/13/2021)   Hepatitis C Screening  07/27/2023 (Originally  08/09/1969)   COLONOSCOPY (Pts 45-50yr Insurance coverage will need to be confirmed)  04/25/2023   MAMMOGRAM  10/19/2023   TETANUS/TDAP  08/10/2030   HPV VACCINES  Aged Out    Health Maintenance  Health Maintenance Due  Topic Date Due   FOOT EXAM  Never done   DEXA SCAN  Never done   HEMOGLOBIN A1C  05/23/2022    Colorectal cancer screening: Type of screening: Colonoscopy. Completed 04/24/13. Repeat every 10 years  Mammogram status: Completed 10/18/21. Repeat every year  Bone Density status: Ordered Patient deferred. Pt provided with contact info and advised to call to schedule appt.  Lung Cancer Screening: (Low Dose CT Chest recommended if Age 69-80years, 30 pack-year currently smoking OR have quit w/in 15years.) does not qualify.    Additional Screening:  Hepatitis C Screening: does qualify; Completed Deferred  Vision Screening: Recommended annual ophthalmology exams for early detection of glaucoma and other disorders of the eye. Is the patient up to date with their annual eye exam?  Yes  Who is the provider or what is the name of the office in which the patient attends annual eye exams? Dr DBing PlumeIf pt is not established with a provider, would they like to be referred to a provider to establish care? No .   Dental Screening: Recommended annual dental exams for proper oral hygiene  Community Resource Referral / Chronic Care Management:  CRR required this visit?  No   CCM required this visit?  No      Plan:     I have personally reviewed and noted the following in the patient's chart:   Medical and social history Use of alcohol, tobacco or illicit drugs  Current medications and supplements including opioid prescriptions. Patient is not currently taking opioid prescriptions. Functional ability and status Nutritional status Physical activity Advanced directives List of other physicians Hospitalizations, surgeries, and ER visits in previous 12  months Vitals Screenings to include cognitive, depression, and falls Referrals and appointments  In addition, I have reviewed and discussed with patient certain preventive protocols, quality metrics, and best practice recommendations. A written personalized care plan for preventive services as well as general preventive health recommendations were provided to patient.     BCriselda Peaches LPN   99/19/1660  Nurse Notes: Patient due labs Hemoglobin A1C and  Hep C Screening

## 2022-07-28 ENCOUNTER — Other Ambulatory Visit (HOSPITAL_COMMUNITY): Payer: Self-pay

## 2022-07-28 DIAGNOSIS — R2681 Unsteadiness on feet: Secondary | ICD-10-CM | POA: Diagnosis not present

## 2022-07-28 DIAGNOSIS — M545 Low back pain, unspecified: Secondary | ICD-10-CM | POA: Diagnosis not present

## 2022-07-28 DIAGNOSIS — S22080A Wedge compression fracture of T11-T12 vertebra, initial encounter for closed fracture: Secondary | ICD-10-CM | POA: Diagnosis not present

## 2022-07-28 DIAGNOSIS — M6281 Muscle weakness (generalized): Secondary | ICD-10-CM | POA: Diagnosis not present

## 2022-07-29 ENCOUNTER — Other Ambulatory Visit (HOSPITAL_COMMUNITY): Payer: Self-pay

## 2022-07-29 ENCOUNTER — Other Ambulatory Visit: Payer: Medicare Other

## 2022-07-29 ENCOUNTER — Ambulatory Visit: Payer: Medicare Other | Admitting: Oncology

## 2022-08-02 DIAGNOSIS — H16292 Other keratoconjunctivitis, left eye: Secondary | ICD-10-CM | POA: Diagnosis not present

## 2022-08-04 ENCOUNTER — Encounter: Payer: Self-pay | Admitting: Family Medicine

## 2022-08-04 DIAGNOSIS — Z23 Encounter for immunization: Secondary | ICD-10-CM | POA: Diagnosis not present

## 2022-08-11 ENCOUNTER — Ambulatory Visit (INDEPENDENT_AMBULATORY_CARE_PROVIDER_SITE_OTHER): Payer: Medicare Other | Admitting: Family Medicine

## 2022-08-11 ENCOUNTER — Encounter: Payer: Self-pay | Admitting: Family Medicine

## 2022-08-11 VITALS — BP 120/70 | HR 73 | Temp 98.2°F | Ht 67.0 in | Wt 189.0 lb

## 2022-08-11 DIAGNOSIS — C4361 Malignant melanoma of right upper limb, including shoulder: Secondary | ICD-10-CM | POA: Diagnosis not present

## 2022-08-11 DIAGNOSIS — E785 Hyperlipidemia, unspecified: Secondary | ICD-10-CM | POA: Diagnosis not present

## 2022-08-11 DIAGNOSIS — G35 Multiple sclerosis: Secondary | ICD-10-CM

## 2022-08-11 DIAGNOSIS — E119 Type 2 diabetes mellitus without complications: Secondary | ICD-10-CM

## 2022-08-11 DIAGNOSIS — K219 Gastro-esophageal reflux disease without esophagitis: Secondary | ICD-10-CM

## 2022-08-11 DIAGNOSIS — E559 Vitamin D deficiency, unspecified: Secondary | ICD-10-CM | POA: Diagnosis not present

## 2022-08-11 DIAGNOSIS — M810 Age-related osteoporosis without current pathological fracture: Secondary | ICD-10-CM

## 2022-08-11 DIAGNOSIS — G5793 Unspecified mononeuropathy of bilateral lower limbs: Secondary | ICD-10-CM

## 2022-08-11 DIAGNOSIS — C8300 Small cell B-cell lymphoma, unspecified site: Secondary | ICD-10-CM

## 2022-08-11 DIAGNOSIS — M159 Polyosteoarthritis, unspecified: Secondary | ICD-10-CM

## 2022-08-11 DIAGNOSIS — M150 Primary generalized (osteo)arthritis: Secondary | ICD-10-CM

## 2022-08-11 LAB — LIPID PANEL
Cholesterol: 136 mg/dL (ref 0–200)
HDL: 44.6 mg/dL (ref >39.00)
NonHDL: 90.95
Total CHOL/HDL Ratio: 3
Triglycerides: 219 mg/dL — ABNORMAL HIGH (ref 0.0–149.0)
VLDL: 43.8 mg/dL — ABNORMAL HIGH (ref 0.0–40.0)

## 2022-08-11 LAB — LDL CHOLESTEROL, DIRECT: Direct LDL: 53 mg/dL

## 2022-08-11 LAB — HEMOGLOBIN A1C: Hgb A1c MFr Bld: 6.8 % — ABNORMAL HIGH (ref 4.6–6.5)

## 2022-08-11 LAB — TSH: TSH: 5.22 u[IU]/mL (ref 0.35–5.50)

## 2022-08-11 LAB — VITAMIN D 25 HYDROXY (VIT D DEFICIENCY, FRACTURES): VITD: 54.43 ng/mL (ref 30.00–100.00)

## 2022-08-11 NOTE — Progress Notes (Signed)
Subjective:    Patient ID: Rachel Coffey, female    DOB: [birth year], 71 y.o.   MRN: [id number]  HPI Here to follow up on issues. She feels well in general. Her one complaint is swelling in her feet and ankles. This started about a year ago, and recently it has gotten a bit worse. It is not painful. She has been wearing compression hose. No SOB. She thinks this is a side effect of her Gabapentin because it started around the time we increased the Gabapentin from 100 mg once a day to 300 mg BID. This was originally started to help with the neuropathy in her feet. This had been causing numbness and tingling, and this has improved somewhat. She sees Dr. Benay Spice regularly for the CLL, and she sees Dr. Pamalee Leyden regularly for the MS. The MS has been stable for years now, and the only symptom she has is an occasional foot drop on the left side. Her GERD is stable.    Review of Systems  Constitutional: Negative.   HENT: Negative.    Eyes: Negative.   Respiratory: Negative.    Cardiovascular: Negative.   Gastrointestinal: Negative.   Genitourinary:  Negative for decreased urine volume, difficulty urinating, dyspareunia, dysuria, enuresis, flank pain, frequency, hematuria, pelvic pain and urgency.  Musculoskeletal: Negative.   Skin: Negative.   Neurological:  Positive for numbness. Negative for headaches.  Psychiatric/Behavioral: Negative.         Objective:   Physical Exam Constitutional:      General: She is not in acute distress.    Appearance: Normal appearance. She is well-developed.  HENT:     Head: Normocephalic and atraumatic.     Right Ear: External ear normal.     Left Ear: External ear normal.     Nose: Nose normal.     Mouth/Throat:     Pharynx: No oropharyngeal exudate.  Eyes:     General: No scleral icterus.    Conjunctiva/sclera: Conjunctivae normal.     Pupils: Pupils are equal, round, and reactive to light.  Neck:     Thyroid: No thyromegaly.     Vascular: No  JVD.  Cardiovascular:     Rate and Rhythm: Normal rate and regular rhythm.     Heart sounds: Normal heart sounds. No murmur heard.    No friction rub. No gallop.  Pulmonary:     Effort: Pulmonary effort is normal. No respiratory distress.     Breath sounds: Normal breath sounds. No wheezing or rales.  Chest:     Chest wall: No tenderness.  Abdominal:     General: Bowel sounds are normal. There is no distension.     Palpations: Abdomen is soft. There is no mass.     Tenderness: There is no abdominal tenderness. There is no guarding or rebound.  Musculoskeletal:        General: No tenderness. Normal range of motion.     Cervical back: Normal range of motion and neck supple.     Comments: 1+ edema in both ankles   Lymphadenopathy:     Cervical: No cervical adenopathy.  Skin:    General: Skin is warm and dry.     Findings: No erythema or rash.  Neurological:     Mental Status: She is alert and oriented to person, place, and time.     Cranial Nerves: No cranial nerve deficit.     Motor: No abnormal muscle tone.     Coordination: Coordination normal.  Deep Tendon Reflexes: Reflexes are normal and symmetric. Reflexes normal.  Psychiatric:        Behavior: Behavior normal.        Thought Content: Thought content normal.        Judgment: Judgment normal.           Assessment & Plan:  Her CLL and MS are well controlled. Her GERD is stable. I agree that her ankle swelling is likely a side effect of the Gabapentin, so we agreed that she will stop taking this. If the neuropathy flares up again, we can discuss alternative ways to treat it. Get fasting labs for lipids, A1c, etc. Set up a DEXA.  We spent a total of ( 33  ) minutes reviewing records and discussing these issues.  Alysia Penna, MD

## 2022-08-12 DIAGNOSIS — Z23 Encounter for immunization: Secondary | ICD-10-CM | POA: Diagnosis not present

## 2022-08-15 ENCOUNTER — Ambulatory Visit (INDEPENDENT_AMBULATORY_CARE_PROVIDER_SITE_OTHER)
Admission: RE | Admit: 2022-08-15 | Discharge: 2022-08-15 | Disposition: A | Discharge status: Home / Self Care | Payer: Medicare Other | Source: Ambulatory Visit | Attending: Family Medicine | Admitting: Family Medicine

## 2022-08-15 ENCOUNTER — Other Ambulatory Visit: Payer: Self-pay | Admitting: Family Medicine

## 2022-08-15 DIAGNOSIS — M810 Age-related osteoporosis without current pathological fracture: Secondary | ICD-10-CM | POA: Diagnosis not present

## 2022-08-17 DIAGNOSIS — R2681 Unsteadiness on feet: Secondary | ICD-10-CM | POA: Diagnosis not present

## 2022-08-17 DIAGNOSIS — M6281 Muscle weakness (generalized): Secondary | ICD-10-CM | POA: Diagnosis not present

## 2022-08-17 DIAGNOSIS — M545 Low back pain, unspecified: Secondary | ICD-10-CM | POA: Diagnosis not present

## 2022-08-17 DIAGNOSIS — S22080A Wedge compression fracture of T11-T12 vertebra, initial encounter for closed fracture: Secondary | ICD-10-CM | POA: Diagnosis not present

## 2022-08-23 ENCOUNTER — Telehealth: Payer: Self-pay

## 2022-08-23 NOTE — Telephone Encounter (Signed)
Patient called in and had a bone scan,then  Dr. Sarajane Jews recommend her to take calcium supplement. She call Dr Benay Spice to check to see if he agree with Dr. Sarajane Jews. Per Dr. Benay Spice it was ok to take the calcium supplement.

## 2022-08-24 DIAGNOSIS — G35 Multiple sclerosis: Secondary | ICD-10-CM | POA: Diagnosis not present

## 2022-09-02 ENCOUNTER — Other Ambulatory Visit: Payer: Self-pay | Admitting: Oncology

## 2022-09-02 DIAGNOSIS — C9110 Chronic lymphocytic leukemia of B-cell type not having achieved remission: Secondary | ICD-10-CM

## 2022-09-05 ENCOUNTER — Telehealth: Payer: Self-pay

## 2022-09-05 NOTE — Telephone Encounter (Signed)
Faxed [phone] over a new prescription of her Imbruvica 420 mg.

## 2022-09-09 ENCOUNTER — Other Ambulatory Visit: Payer: Self-pay

## 2022-09-09 ENCOUNTER — Encounter: Payer: Self-pay | Admitting: Family Medicine

## 2022-09-09 MED ORDER — FUROSEMIDE 20 MG PO TABS: ORAL_TABLET | ORAL | 2 refills | Status: DC

## 2022-09-09 NOTE — Telephone Encounter (Signed)
Let's try a mild diuretic. Call in Lasix 20 mg every morning, #30 with 2 rf

## 2022-09-19 ENCOUNTER — Encounter: Payer: Self-pay | Admitting: Family Medicine

## 2022-09-19 NOTE — Telephone Encounter (Signed)
Tell her to come in after the first of the year

## 2022-09-22 DIAGNOSIS — Z23 Encounter for immunization: Secondary | ICD-10-CM | POA: Diagnosis not present

## 2022-09-26 ENCOUNTER — Other Ambulatory Visit: Payer: Self-pay | Admitting: *Deleted

## 2022-09-26 DIAGNOSIS — C9110 Chronic lymphocytic leukemia of B-cell type not having achieved remission: Secondary | ICD-10-CM

## 2022-09-26 MED ORDER — IBRUTINIB 420 MG PO TABS: ORAL_TABLET | ORAL | 3 refills | Status: DC

## 2022-09-26 NOTE — Telephone Encounter (Signed)
Received faxed refill request from [facility] for [place].

## 2022-10-03 ENCOUNTER — Other Ambulatory Visit (HOSPITAL_COMMUNITY): Payer: Self-pay

## 2022-10-04 ENCOUNTER — Other Ambulatory Visit (HOSPITAL_COMMUNITY): Payer: Self-pay

## 2022-10-14 ENCOUNTER — Encounter: Payer: Self-pay | Admitting: Family Medicine

## 2022-10-14 ENCOUNTER — Other Ambulatory Visit (HOSPITAL_COMMUNITY): Payer: Self-pay

## 2022-10-14 ENCOUNTER — Encounter: Payer: Self-pay | Admitting: Oncology

## 2022-10-17 ENCOUNTER — Other Ambulatory Visit: Payer: Self-pay | Admitting: *Deleted

## 2022-10-26 ENCOUNTER — Other Ambulatory Visit: Payer: Self-pay | Admitting: *Deleted

## 2022-10-26 ENCOUNTER — Other Ambulatory Visit (HOSPITAL_COMMUNITY): Payer: Self-pay

## 2022-10-26 ENCOUNTER — Other Ambulatory Visit: Payer: Self-pay

## 2022-10-26 DIAGNOSIS — C9110 Chronic lymphocytic leukemia of B-cell type not having achieved remission: Secondary | ICD-10-CM

## 2022-10-26 MED ORDER — IBRUTINIB 420 MG PO TABS
ORAL_TABLET | ORAL | 3 refills | Status: DC
  Filled 2022-10-26: qty 28, fill #0
  Filled 2022-10-26: qty 28, 28d supply, fill #0
  Filled 2022-11-28: qty 28, 28d supply, fill #1
  Filled 2022-12-22: qty 28, 28d supply, fill #2
  Filled 2023-01-19: qty 28, 28d supply, fill #3

## 2022-10-26 NOTE — Telephone Encounter (Signed)
Oral oncology team requested refill on ibrutinib to be registered/filled at [facility] to allow her grant to continue. Script sent.

## 2022-11-10 ENCOUNTER — Other Ambulatory Visit (HOSPITAL_COMMUNITY): Payer: Self-pay

## 2022-11-16 ENCOUNTER — Other Ambulatory Visit (HOSPITAL_COMMUNITY): Payer: Self-pay

## 2022-11-21 ENCOUNTER — Encounter: Payer: Self-pay | Admitting: Family Medicine

## 2022-11-21 ENCOUNTER — Other Ambulatory Visit: Payer: Self-pay | Admitting: Family Medicine

## 2022-11-21 NOTE — Telephone Encounter (Signed)
The RX is ready to fax

## 2022-11-22 NOTE — Telephone Encounter (Signed)
Pharmacy is requesting a new prescription with the frequency for her testing.

## 2022-11-22 NOTE — Telephone Encounter (Signed)
Tell them she is to test once daily

## 2022-11-28 ENCOUNTER — Other Ambulatory Visit (HOSPITAL_COMMUNITY): Payer: Self-pay

## 2022-11-29 ENCOUNTER — Encounter: Payer: Self-pay | Admitting: Family Medicine

## 2022-11-29 ENCOUNTER — Ambulatory Visit (INDEPENDENT_AMBULATORY_CARE_PROVIDER_SITE_OTHER): Payer: Medicare Other | Admitting: Family Medicine

## 2022-11-29 VITALS — BP 130/68 | HR 68 | Temp 98.3°F | Wt 184.0 lb

## 2022-11-29 DIAGNOSIS — E119 Type 2 diabetes mellitus without complications: Secondary | ICD-10-CM

## 2022-11-29 DIAGNOSIS — M25471 Effusion, right ankle: Secondary | ICD-10-CM

## 2022-11-29 DIAGNOSIS — M25472 Effusion, left ankle: Secondary | ICD-10-CM | POA: Diagnosis not present

## 2022-11-29 DIAGNOSIS — R252 Cramp and spasm: Secondary | ICD-10-CM | POA: Diagnosis not present

## 2022-11-29 LAB — BASIC METABOLIC PANEL
BUN: 20 mg/dL (ref 6–23)
CO2: 33 mEq/L — ABNORMAL HIGH (ref 19–32)
Calcium: 9.9 mg/dL (ref 8.4–10.5)
Chloride: 103 mEq/L (ref 96–112)
Creatinine, Ser: 1.04 mg/dL (ref 0.40–1.20)
GFR: 54.92 mL/min — ABNORMAL LOW (ref >60.00)
Glucose, Bld: 119 mg/dL — ABNORMAL HIGH (ref 70–99)
Potassium: 4.6 mEq/L (ref 3.5–5.1)
Sodium: 143 mEq/L (ref 135–145)

## 2022-11-29 LAB — HEMOGLOBIN A1C: Hgb A1c MFr Bld: 7.2 % — ABNORMAL HIGH (ref 4.6–6.5)

## 2022-11-29 LAB — MAGNESIUM: Magnesium: 1.7 mg/dL (ref 1.5–2.5)

## 2022-11-29 NOTE — Progress Notes (Signed)
   Subjective:    Patient ID: Rachel Coffey, female    DOB: [birth year], 72 y.o.   MRN: [id number]  HPI Here to discuss muscle cramps. She has had these for several years, but they have gotten worse. She can get cramps anywhere over her body, but her hands bother her more than anything. Sometimes it is difficult to cook, write, or grasp things. She has used Voltaren gel and Tylenol.    Review of Systems  Constitutional: Negative.   Respiratory: Negative.    Cardiovascular: Negative.   Musculoskeletal:  Positive for myalgias.       Objective:   Physical Exam Constitutional:      Appearance: Normal appearance.  Cardiovascular:     Rate and Rhythm: Normal rate and regular rhythm.     Pulses: Normal pulses.     Heart sounds: Normal heart sounds.  Pulmonary:     Effort: Pulmonary effort is normal.     Breath sounds: Normal breath sounds.  Neurological:     Mental Status: She is alert.           Assessment & Plan:  For the muscle cramps we will check a BMET and a magnesium level today. I advised her to take 1 or 2 tablets of 400 mg magnesium OTC every day and to drink plenty of water. We will check an A1c today to follow her diabetes.  Alysia Penna, MD

## 2022-11-30 ENCOUNTER — Other Ambulatory Visit (HOSPITAL_COMMUNITY): Payer: Self-pay

## 2022-11-30 NOTE — Telephone Encounter (Signed)
Her kidneys are functioning well. Let's check another A1c in 90 days

## 2022-12-01 ENCOUNTER — Other Ambulatory Visit: Payer: Self-pay

## 2022-12-01 ENCOUNTER — Other Ambulatory Visit (HOSPITAL_COMMUNITY): Payer: Self-pay

## 2022-12-01 DIAGNOSIS — E119 Type 2 diabetes mellitus without complications: Secondary | ICD-10-CM

## 2022-12-06 DIAGNOSIS — D485 Neoplasm of uncertain behavior of skin: Secondary | ICD-10-CM | POA: Diagnosis not present

## 2022-12-06 DIAGNOSIS — L578 Other skin changes due to chronic exposure to nonionizing radiation: Secondary | ICD-10-CM | POA: Diagnosis not present

## 2022-12-06 DIAGNOSIS — Z85828 Personal history of other malignant neoplasm of skin: Secondary | ICD-10-CM | POA: Diagnosis not present

## 2022-12-06 DIAGNOSIS — Z85820 Personal history of malignant melanoma of skin: Secondary | ICD-10-CM | POA: Diagnosis not present

## 2022-12-06 DIAGNOSIS — D225 Melanocytic nevi of trunk: Secondary | ICD-10-CM | POA: Diagnosis not present

## 2022-12-06 DIAGNOSIS — Z808 Family history of malignant neoplasm of other organs or systems: Secondary | ICD-10-CM | POA: Diagnosis not present

## 2022-12-06 DIAGNOSIS — L720 Epidermal cyst: Secondary | ICD-10-CM | POA: Diagnosis not present

## 2022-12-06 DIAGNOSIS — L988 Other specified disorders of the skin and subcutaneous tissue: Secondary | ICD-10-CM | POA: Diagnosis not present

## 2022-12-06 DIAGNOSIS — L821 Other seborrheic keratosis: Secondary | ICD-10-CM | POA: Diagnosis not present

## 2022-12-06 DIAGNOSIS — L570 Actinic keratosis: Secondary | ICD-10-CM | POA: Diagnosis not present

## 2022-12-06 DIAGNOSIS — D2271 Melanocytic nevi of right lower limb, including hip: Secondary | ICD-10-CM | POA: Diagnosis not present

## 2022-12-09 DIAGNOSIS — Z1231 Encounter for screening mammogram for malignant neoplasm of breast: Secondary | ICD-10-CM | POA: Diagnosis not present

## 2022-12-09 LAB — HM MAMMOGRAPHY

## 2022-12-21 ENCOUNTER — Other Ambulatory Visit (HOSPITAL_COMMUNITY): Payer: Self-pay

## 2022-12-22 ENCOUNTER — Other Ambulatory Visit (HOSPITAL_COMMUNITY): Payer: Self-pay

## 2022-12-27 ENCOUNTER — Other Ambulatory Visit: Payer: Self-pay

## 2023-01-19 ENCOUNTER — Other Ambulatory Visit (HOSPITAL_COMMUNITY): Payer: Self-pay

## 2023-01-20 ENCOUNTER — Inpatient Hospital Stay: Payer: Medicare Other | Attending: Oncology

## 2023-01-20 ENCOUNTER — Inpatient Hospital Stay (HOSPITAL_BASED_OUTPATIENT_CLINIC_OR_DEPARTMENT_OTHER): Payer: Medicare Other | Admitting: Oncology

## 2023-01-20 VITALS — BP 123/50 | HR 76 | Temp 98.1°F | Resp 18 | Ht 67.0 in | Wt 184.0 lb

## 2023-01-20 DIAGNOSIS — E119 Type 2 diabetes mellitus without complications: Secondary | ICD-10-CM | POA: Insufficient documentation

## 2023-01-20 DIAGNOSIS — K5900 Constipation, unspecified: Secondary | ICD-10-CM | POA: Diagnosis not present

## 2023-01-20 DIAGNOSIS — C9110 Chronic lymphocytic leukemia of B-cell type not having achieved remission: Secondary | ICD-10-CM | POA: Diagnosis not present

## 2023-01-20 DIAGNOSIS — Z85820 Personal history of malignant melanoma of skin: Secondary | ICD-10-CM | POA: Diagnosis not present

## 2023-01-20 DIAGNOSIS — J449 Chronic obstructive pulmonary disease, unspecified: Secondary | ICD-10-CM | POA: Insufficient documentation

## 2023-01-20 DIAGNOSIS — R1110 Vomiting, unspecified: Secondary | ICD-10-CM | POA: Insufficient documentation

## 2023-01-20 DIAGNOSIS — G35 Multiple sclerosis: Secondary | ICD-10-CM | POA: Diagnosis not present

## 2023-01-20 LAB — CMP (CANCER CENTER ONLY)
ALT: 12 U/L (ref 0–44)
AST: 16 U/L (ref 15–41)
Albumin: 3.9 g/dL (ref 3.5–5.0)
Alkaline Phosphatase: 57 U/L (ref 38–126)
Anion gap: 6 (ref 5–15)
BUN: 20 mg/dL (ref 8–23)
CO2: 32 mmol/L (ref 22–32)
Calcium: 9.8 mg/dL (ref 8.9–10.3)
Chloride: 103 mmol/L (ref 98–111)
Creatinine: 0.92 mg/dL (ref 0.44–1.00)
GFR, Estimated: >60 mL/min (ref >60)
Glucose, Bld: 136 mg/dL — ABNORMAL HIGH (ref 70–99)
Potassium: 4.4 mmol/L (ref 3.5–5.1)
Sodium: 141 mmol/L (ref 135–145)
Total Bilirubin: 0.8 mg/dL (ref 0.3–1.2)
Total Protein: 6.4 g/dL — ABNORMAL LOW (ref 6.5–8.1)

## 2023-01-20 LAB — CBC WITH DIFFERENTIAL (CANCER CENTER ONLY)
Abs Immature Granulocytes: 0.01 10*3/uL (ref 0.00–0.07)
Basophils Absolute: 0.1 10*3/uL (ref 0.0–0.1)
Basophils Relative: 1 %
Eosinophils Absolute: 0.1 10*3/uL (ref 0.0–0.5)
Eosinophils Relative: 2 %
HCT: 42.4 % (ref 36.0–46.0)
Hemoglobin: 13.5 g/dL (ref 12.0–15.0)
Immature Granulocytes: 0 %
Lymphocytes Relative: 25 %
Lymphs Abs: 1.8 10*3/uL (ref 0.7–4.0)
MCH: 28.5 pg (ref 26.0–34.0)
MCHC: 31.8 g/dL (ref 30.0–36.0)
MCV: 89.5 fL (ref 80.0–100.0)
Monocytes Absolute: 0.6 10*3/uL (ref 0.1–1.0)
Monocytes Relative: 9 %
Neutro Abs: 4.6 10*3/uL (ref 1.7–7.7)
Neutrophils Relative %: 63 %
Platelet Count: 145 10*3/uL — ABNORMAL LOW (ref 150–400)
RBC: 4.74 MIL/uL (ref 3.87–5.11)
RDW: 13 % (ref 11.5–15.5)
WBC Count: 7.1 10*3/uL (ref 4.0–10.5)
nRBC: 0 % (ref 0.0–0.2)

## 2023-01-20 NOTE — Progress Notes (Signed)
[facility] OFFICE PROGRESS NOTE   Diagnosis: CLL  INTERVAL HISTORY:   Rachel Coffey returns as scheduled.  She continues ibrutinib.  She bruises easily.  No change in chronic arthralgias.  She has intermittent episodes of severe "reflux "beginning approximately 6 months ago.  These occur several days per month and resolve after approximately 24 hours.  She takes an extra Nexium tablet for relief of reflux symptoms.  She vomits when she has these episodes.  She has intermittent episodes of constipation followed by diarrhea.  Diarrhea is not consistent.  No fever or recent infection.  No abdominal pain or fullness.  Objective:  Vital signs in last 24 hours:  Blood pressure (!) 123/50, pulse 76, temperature 98.1 F (36.7 C), temperature source Oral, resp. rate 18, height '5\' 7"'$  (1.702 m), weight 184 lb (83.5 kg), SpO2 98 %.    Lymphatics: No cervical, supraclavicular, axillary, or inguinal nodes Resp: Lungs clear bilaterally Cardio: Regular rate and rhythm GI: No hepatosplenomegaly, no mass, nontender Vascular: No leg edema   Lab Results:  Lab Results  Component Value Date   WBC 7.1 01/20/2023   HGB 13.5 01/20/2023   HCT 42.4 01/20/2023   MCV 89.5 01/20/2023   PLT 145 (L) 01/20/2023   NEUTROABS 4.6 01/20/2023    CMP  Lab Results  Component Value Date   NA 141 01/20/2023   K 4.4 01/20/2023   CL 103 01/20/2023   CO2 32 01/20/2023   GLUCOSE 136 (H) 01/20/2023   BUN 20 01/20/2023   CREATININE 0.92 01/20/2023   CALCIUM 9.8 01/20/2023   PROT 6.4 (L) 01/20/2023   ALBUMIN 3.9 01/20/2023   AST 16 01/20/2023   ALT 12 01/20/2023   ALKPHOS 57 01/20/2023   BILITOT 0.8 01/20/2023   GFRNONAA >60 01/20/2023   GFRAA >60 06/22/2020     Medications: I have reviewed the patient's current medications.   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 06/20/2016 Monoclonal Kappa restricted B-cell population  identified on flow cytometry Staging PET scan 06/13/2016 confirmed hypermetabolic abdominal/pelvic lymphadenopathy FISH panel positive for 11q-,-12,13q-, and 17p-(10% of cells) Peripheral blood flow cytometry 09/22/2016 at [facility]-CD5/CD23 positive For restricted B-cell population, 2% of lymphocytes Peripheral blood IGH mutation analysis at [facility] 09/22/2016-unmutated CT 06/06/2017-generally stable diffuse lymphadenopathy, node in the pelvis is smaller, some the lymph nodes are larger CTs 07/02/2018- slight decrease in size of retrocrural, retroperitoneal, and mesenteric adenopathy, new 6 mm left upper lobe nodule CT chest 01/01/2019- increase in size of left upper lobe lung nodule-inflammatory appearance, other stable lung nodules, right retrocrural and upper abdominal adenopathy has progressed CTs 07/04/2019- progressive lower thoracic and abdominal adenopathy with dominant mass-effect on the stomach and pancreas, new heterogeneity in the spleen- possible splenic lesion, stable lung nodules Ibrutinib starting 08/14/2019 CT abdomen/pelvis 11/19/2019-decreased size of dominant abdominal mass and decreased abdominal lymphadenopathy CTs 06/22/2020-2 upper abdominal lymph nodes have decreased in size, no evidence of disease progression, unchanged bilateral lung nodules   Stage IB (T1b,N0) melanoma of the right forearm, status post a wide excision and sentinel lymph node biopsy at [facility] March 2017     3.    Multiple sclerosis   4.    Diabetes   5.   COPD  6.  T12 compression fracture after a fall May 22, T12 kyphoplasty 05/07/2021    Disposition: Rachel Coffey has chronic lymphocytic leukemia.  She been maintained on ibrutinib since September 2020.  She is tolerating the ibrutinib well.  There  is no clinical evidence of disease progression.  I doubt her reflux symptoms are related to ibrutinib or the CLL.  She reports the symptoms are not similar to the abdominal pain she had at diagnosis. She will call for  consistent GI symptoms.  She will continue ibrutinib.  She will return for an office visit in 6 months.  She will see Dr. Sarajane Jews in the interim. Rachel Coder, MD  01/20/2023  11:57 AM

## 2023-01-24 ENCOUNTER — Other Ambulatory Visit (HOSPITAL_COMMUNITY): Payer: Self-pay

## 2023-01-26 ENCOUNTER — Encounter: Payer: Self-pay | Admitting: Podiatry

## 2023-01-26 ENCOUNTER — Ambulatory Visit (INDEPENDENT_AMBULATORY_CARE_PROVIDER_SITE_OTHER): Payer: Medicare Other | Admitting: Podiatry

## 2023-01-26 VITALS — BP 166/77 | HR 73

## 2023-01-26 DIAGNOSIS — L603 Nail dystrophy: Secondary | ICD-10-CM

## 2023-01-26 NOTE — Progress Notes (Signed)
Subjective:  Patient ID: Rachel Coffey, female    DOB: 09-02-1951,  MRN: [id number]  Chief Complaint  Patient presents with   Nail Problem    "He's going to look at my nail and tell me why there's blood underneath it."    72 y.o. female presents with the above complaint.  Patient presents with left hallux nail contusion/nail dystrophy.  She states that there is some blood underneath the nail.  She wants to make sure that there is nothing concerning.  She would like to discuss treatment options for she is a diabetic.  Occasional pain while ambulating.  She has not seen anyone and started see me for this.   Review of Systems: Negative except as noted in the HPI. Denies N/V/F/Ch.  Past Medical History:  Diagnosis Date   Asthma    stress induced with allergies   Cancer ([place])    CLL (chronic lymphocytic leukemia) ([place])    sees Dr. Benay Spice   Compression fracture of thoracic vertebra ([place])    at T11 and T12, sees Dr. Kary Kos   Genital warts    Hyperlipidemia    MS (multiple sclerosis) [facility])    sees Dr. Otto Herb of [facility] at the [facility] office   Type 2 diabetes mellitus [facility]     Current Outpatient Medications:    acetaminophen (TYLENOL) 500 MG tablet, Take 500 mg by mouth as needed., Disp: , Rfl:    ALPRAZolam (XANAX) 0.5 MG tablet, Take 0.5 mg by mouth daily as needed (for pain). , Disp: , Rfl:    baclofen (LIORESAL) 10 MG tablet, Take 5 mg by mouth 3 (three) times daily as needed., Disp: , Rfl:    Cholecalciferol (VITAMIN D) 50 MCG (2000 UT) CAPS, Take 2,000 Units by mouth daily. , Disp: , Rfl:    diclofenac Sodium (VOLTAREN) 1 % GEL, Apply 1 application. topically 4 (four) times daily as needed (pain)., Disp: , Rfl:    esomeprazole (NEXIUM) 20 MG capsule, Take 20 mg by mouth daily., Disp: , Rfl:    fluticasone (FLONASE) 50 MCG/ACT nasal spray, Place 1 spray into both nostrils daily as needed for allergies. Sometimes bid, Disp: , Rfl:    furosemide (LASIX) 20  MG tablet, TAKE ONE TABLET BY MOUTH EVERY MORNING, Disp: 30 tablet, Rfl: 2   glipiZIDE (GLUCOTROL XL) 2.5 MG 24 hr tablet, TAKE ONE TABLET BY MOUTH TWICE A DAY, Disp: 180 tablet, Rfl: 3   glucose blood (CONTOUR NEXT TEST) test strip, Use as instructed, Disp: 100 each, Rfl: 0   ibrutinib (IMBRUVICA) 420 MG tablet, TAKE 1 TABLET BY MOUTH ONCE DAILY.  TABLET SHOULD BE TAKEN WITH A GLASS OF WATER., Disp: 28 tablet, Rfl: 3   Lancets Misc. MISC, 1 each by Does not apply route as directed., Disp: 100 each, Rfl: 0   loratadine (CLARITIN) 10 MG tablet, Take 10 mg by mouth daily as needed (allergies)., Disp: , Rfl:    losartan (COZAAR) 25 MG tablet, TAKE ONE TABLET ('25MG'$  TOTAL) BY MOUTH DAILY, Disp: 90 tablet, Rfl: 3   MAGNESIUM OXIDE 400 PO, Take 800 mg by mouth daily at 6 (six) AM., Disp: , Rfl:    meloxicam (MOBIC) 15 MG tablet, TAKE ONE (1) TABLET BY MOUTH EVERY DAY AS NEEDED FOR KNEE PAIN, Disp: 30 tablet, Rfl: 0   simvastatin (ZOCOR) 20 MG tablet, TAKE ONE TABLET ('20MG'$  TOTAL) BY MOUTH ATBEDTIME, Disp: 90 tablet, Rfl: 3   VENTOLIN HFA 108 (90 Base) MCG/ACT inhaler, INHALE  2 PUFFS EVERY 4 TO 6 HOURS AS NEEDED. SHAKE WELL BEFORE EACH USE., Disp: 18 g, Rfl: 5  Social History   Tobacco Use  Smoking Status Former   Types: Cigarettes   Quit date: 01/31/2005   Years since quitting: 17.9  Smokeless Tobacco Never    Allergies  Allergen Reactions   Amoxicillin-Pot Clavulanate Other (See Comments)    Pt preferred - causes upset stomach    Codeine Itching   Fenofibrate     aching joints   Hydrocodone Itching   Hydrocodone-Acetaminophen Itching   Lisinopril Cough   Metformin And Related     Makes pt deathly ill   Objective:   Vitals:   01/26/23 1122  BP: (!) 166/77  Pulse: 73   There is no height or weight on file to calculate BMI. Constitutional Well developed. Well nourished.  Vascular Dorsalis pedis pulses palpable bilaterally. Posterior tibial pulses palpable bilaterally. Capillary  refill normal to all digits.  No cyanosis or clubbing noted. Pedal hair growth normal.  Neurologic Normal speech. Oriented to person, place, and time. Epicritic sensation to light touch grossly present bilaterally.  Dermatologic Left hallux nail dystrophy with underlying hematoma small in nature.  No active bleeding noted.  Nail appears to be well adhered to the underlying nailbed.  No signs of loosening  Orthopedic: Normal joint ROM without pain or crepitus bilaterally. No visible deformities. No bony tenderness.   Radiographs: None Assessment:   1. Nail dystrophy    Plan:  Patient was evaluated and treated and all questions answered.  Left hallux nail dystrophy with underlying hematoma less than 25% -All questions and concerns were discussed with the patient extensively time -.  At this moment the nail is well adhered to the underlying nailbed.  If it becomes loose I have asked her to come back and see me we can do a nail avulsion.  But for now I am hopeful that this will heal on its own.  She states understanding  No follow-ups on file.  Left hallux nail dystrophy with underlying hematoma slant back.  Will think about removing it if it does not get better

## 2023-02-02 ENCOUNTER — Encounter: Payer: Self-pay | Admitting: Family Medicine

## 2023-02-03 NOTE — Telephone Encounter (Signed)
I sent in the new RX  °

## 2023-02-06 ENCOUNTER — Encounter: Payer: Self-pay | Admitting: Podiatry

## 2023-02-06 ENCOUNTER — Other Ambulatory Visit: Payer: Self-pay | Admitting: Podiatry

## 2023-02-06 MED ORDER — DOXYCYCLINE HYCLATE 100 MG PO TABS: 100.0000 mg | ORAL_TABLET | Freq: Two times a day (BID) | ORAL | 0 refills | Status: DC

## 2023-02-09 ENCOUNTER — Other Ambulatory Visit: Payer: Self-pay | Admitting: Podiatry

## 2023-02-09 ENCOUNTER — Encounter: Payer: Self-pay | Admitting: Podiatry

## 2023-02-09 MED ORDER — SULFAMETHOXAZOLE-TRIMETHOPRIM 800-160 MG PO TABS: 1.0000 | ORAL_TABLET | Freq: Two times a day (BID) | ORAL | 0 refills | Status: AC

## 2023-02-16 ENCOUNTER — Other Ambulatory Visit: Payer: Self-pay | Admitting: Oncology

## 2023-02-16 ENCOUNTER — Other Ambulatory Visit: Payer: Self-pay

## 2023-02-16 ENCOUNTER — Other Ambulatory Visit (HOSPITAL_COMMUNITY): Payer: Self-pay

## 2023-02-16 DIAGNOSIS — C9110 Chronic lymphocytic leukemia of B-cell type not having achieved remission: Secondary | ICD-10-CM

## 2023-02-16 MED ORDER — IBRUTINIB 420 MG PO TABS
ORAL_TABLET | ORAL | 3 refills | Status: DC
  Filled 2023-02-24: qty 28, 28d supply, fill #0
  Filled 2023-03-20: qty 28, 28d supply, fill #1
  Filled 2023-05-08: qty 28, 28d supply, fill #2
  Filled 2023-06-07: qty 28, 28d supply, fill #3

## 2023-02-22 ENCOUNTER — Encounter: Payer: Self-pay | Admitting: Family Medicine

## 2023-02-22 ENCOUNTER — Other Ambulatory Visit: Payer: Self-pay | Admitting: Family Medicine

## 2023-02-22 DIAGNOSIS — R591 Generalized enlarged lymph nodes: Secondary | ICD-10-CM

## 2023-02-22 NOTE — Telephone Encounter (Signed)
Call in Meloxicam 15 mg daily, #90 with 3 rf 

## 2023-02-24 ENCOUNTER — Other Ambulatory Visit: Payer: Self-pay

## 2023-02-24 ENCOUNTER — Other Ambulatory Visit (HOSPITAL_COMMUNITY): Payer: Self-pay

## 2023-02-24 MED ORDER — MELOXICAM 15 MG PO TABS: 15.0000 mg | ORAL_TABLET | Freq: Every day | ORAL | 3 refills | Status: DC

## 2023-02-24 NOTE — Telephone Encounter (Signed)
Pt called to say she contacted the pharmacy and was told they still do not have this Rx.  Pt is asking if MD can please assist with this refill?

## 2023-02-27 ENCOUNTER — Other Ambulatory Visit (HOSPITAL_COMMUNITY): Payer: Self-pay

## 2023-02-27 ENCOUNTER — Ambulatory Visit
Admission: EM | Admit: 2023-02-27 | Discharge: 2023-02-27 | Disposition: A | Discharge status: Home / Self Care | Payer: Medicare Other

## 2023-02-27 DIAGNOSIS — S20361A Insect bite (nonvenomous) of right front wall of thorax, initial encounter: Secondary | ICD-10-CM | POA: Diagnosis not present

## 2023-02-27 DIAGNOSIS — W57XXXA Bitten or stung by nonvenomous insect and other nonvenomous arthropods, initial encounter: Secondary | ICD-10-CM | POA: Diagnosis not present

## 2023-02-27 NOTE — ED Triage Notes (Signed)
Pt states she has a tick under her right breast. Tried to remove it herself but was not able to get all of it out. Noticed the tick this morning.

## 2023-02-27 NOTE — ED Provider Notes (Signed)
[facility]    CSN: [id number] Arrival date & time: 02/27/23  0835      History   Chief Complaint Chief Complaint  Patient presents with   Tick Removal    HPI Rachel Coffey is a 72 y.o. female.   Patient presents today for tick bite that she noticed this morning under her right breast.  Reports her husband has tried to remove it without success.  Reports that she has a dog, has also been in her yard enjoying the weather the past few days.  [place] that [place] her yard.  No fever, no bodyaches or chills, or headache today.  No rash around the tick bite.  Patient is requesting tick removal today.  Past Medical History:  Diagnosis Date   Asthma    stress induced with allergies   Cancer    CLL (chronic lymphocytic leukemia)    sees Dr. Truett Perna   Compression fracture of thoracic vertebra    at T11 and T12, sees Dr. Donalee Citrin   Genital warts    Hyperlipidemia    MS (multiple sclerosis)    sees Dr. Anselm Pancoast of [facility] at the [facility] office   Type 2 diabetes mellitus     Patient Active Problem List   Diagnosis Date Noted   Neuropathy of both feet 02/16/2022   Anxiety disorder 12/16/2020   Asthma without status asthmaticus 12/16/2020   Catarrhal nasal discharge 12/16/2020   Diarrhea 12/16/2020   Diverticular disease of colon 12/16/2020   Dyslipidemia 12/16/2020   Epigastric pain 12/16/2020   Gastroesophageal reflux disease 12/16/2020   Hardening of the aorta (main artery of the heart) 12/16/2020   History of lymphoma 12/16/2020   History of malignant neoplasm of skin 12/16/2020   Knee pain 12/16/2020   Lymphoma of intra-abdominal lymph nodes 12/16/2020   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   Obesity 12/16/2020   Osteoarthritis 12/16/2020   Spasm 12/16/2020   Vitamin D deficiency 12/16/2020   Lymphoma, small lymphocytic 07/24/2019   Dysphonia 07/05/2018   Globus  pharyngeus 06/19/2018   Sudden idiopathic hearing loss of left ear 05/31/2018   Temporomandibular joint (TMJ) pain 05/31/2018   Referred otalgia of left ear 05/24/2018   CLL (chronic lymphocytic leukemia) 06/28/2016   Lymphoplasmacytoid lymphoma, 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/24/2016   Lymphadenopathy, retroperitoneal 06/03/2016   Malignant melanoma of arm, right 01/13/2016   Weakness of back 01/08/2014   Back pain, lumbosacral 01/08/2014   Cholelithiasis with cholecystitis 02/06/2013   Type 2 diabetes mellitus without complications (HCC) 04/02/2010   HYPERLIPIDEMIA 04/02/2010   MULTIPLE SCLEROSIS 04/02/2010   ALLERGIC RHINITIS 04/02/2010   Asthma 04/02/2010   DIVERTICULITIS, HX OF 04/02/2010    Past Surgical History:  Procedure Laterality Date   ABDOMINAL HYSTERECTOMY     with 1 ovary removed-[facility]   CHOLECYSTECTOMY N/A 02/25/2013   Procedure: LAPAROSCOPIC CHOLECYSTECTOMY WITH INTRAOPERATIVE CHOLANGIOGRAM;  Surgeon: Velora Heckler, MD;  Location: [facility];  Service: General;  Laterality: N/A;   COLONOSCOPY  02/19/2013   per Dr. Dorena Cookey, clear, repeat in 10 yrs   IR KYPHO THORACIC WITH BONE BIOPSY  05/07/2021   TONSILLECTOMY      OB History     Gravida  3   Para  2   Term  2   Preterm      AB  1   Living  2      SAB  1  IAB      Ectopic      Multiple      Live Births               Home Medications    Prior to Admission medications   Medication Sig Start Date End Date Taking? Authorizing Provider  acetaminophen (TYLENOL) 500 MG tablet Take 500 mg by mouth as needed.   Yes [provider]  ALPRAZolam Prudy Feeler) 0.5 MG tablet Take 0.5 mg by mouth daily as needed (for pain).    Yes [provider]  baclofen (LIORESAL) 10 MG tablet Take 5 mg by mouth 3 (three) times daily as needed. 04/15/21  Yes [provider]  Cholecalciferol (VITAMIN D) 50 MCG (2000 UT) CAPS Take 2,000 Units by mouth daily.    Yes [provider]  diclofenac Sodium  (VOLTAREN) 1 % GEL Apply 1 application. topically 4 (four) times daily as needed (pain).   Yes [provider]  esomeprazole (NEXIUM) 20 MG capsule Take 20 mg by mouth daily.   Yes [provider]  fluticasone (FLONASE) 50 MCG/ACT nasal spray Place 1 spray into both nostrils daily as needed for allergies. Sometimes bid   Yes [provider]  furosemide (LASIX) 20 MG tablet TAKE ONE TABLET BY MOUTH EVERY MORNING 11/21/22  Yes Nelwyn Salisbury, MD  glipiZIDE (GLUCOTROL XL) 2.5 MG 24 hr tablet TAKE ONE TABLET BY MOUTH TWICE A DAY 08/16/22  Yes Nelwyn Salisbury, MD  glucose blood (CONTOUR NEXT TEST) test strip Use as instructed 02/10/22  Yes Nelwyn Salisbury, MD  ibrutinib (IMBRUVICA) 420 MG tablet TAKE 1 TABLET BY MOUTH ONCE DAILY.  TABLET SHOULD BE TAKEN WITH A GLASS OF WATER. 02/16/23  Yes Ladene Artist, MD  Lancets Misc. MISC 1 each by Does not apply route as directed. 02/10/22  Yes Nelwyn Salisbury, MD  loratadine (CLARITIN) 10 MG tablet Take 10 mg by mouth daily as needed (allergies).   Yes [provider]  losartan (COZAAR) 25 MG tablet TAKE ONE TABLET (  TOTAL) BY MOUTH DAILY 02/16/22  Yes Nelwyn Salisbury, MD  MAGNESIUM OXIDE 400 PO Take 800 mg by mouth daily at 6 (six) AM.   Yes [provider]  meloxicam (MOBIC) 15 MG tablet TAKE ONE (1) TABLET BY MOUTH EVERY DAY AS NEEDED FOR KNEE PAIN 02/24/23  Yes Nelwyn Salisbury, MD  meloxicam (MOBIC) 15 MG tablet Take 1 tablet (15 mg total) by mouth daily. 02/24/23  Yes Nelwyn Salisbury, MD  simvastatin (ZOCOR) 20 MG tablet TAKE ONE TABLET (  TOTAL) BY MOUTH ATBEDTIME 02/16/22  Yes Nelwyn Salisbury, MD  VENTOLIN HFA 108 (90 Base) MCG/ACT inhaler INHALE 2 PUFFS EVERY 4 TO 6 HOURS AS NEEDED. SHAKE WELL BEFORE EACH USE. 01/28/22  Yes Nelwyn Salisbury, MD    Family History Family History  Problem Relation Age of Onset   COPD Mother    Heart disease Mother    Diabetes Father    Heart disease Sister     Social History Social  History   Tobacco Use   Smoking status: Former    Types: Cigarettes    Quit date: 01/31/2005    Years since quitting: 18.0   Smokeless tobacco: Never  Vaping Use   Vaping Use: Never used  Substance Use Topics   Alcohol use: No   Drug use: No     Allergies   Amoxicillin-pot clavulanate, Codeine, Doxycycline, Fenofibrate, Hydrocodone, Hydrocodone-acetaminophen, Lisinopril, and Metformin and  related   Review of Systems Review of Systems Per HPI  Physical Exam Triage Vital Signs ED Triage Vitals  Enc Vitals Group     BP 02/27/23 0841 (!) 163/68     Pulse Rate 02/27/23 0841 96     Resp 02/27/23 0841 18     Temp 02/27/23 0841 98.2 F (36.8 C)     Temp Source 02/27/23 0841 Oral     SpO2 02/27/23 0841 94 %     Weight --      Height --      Head Circumference --      Peak Flow --      Pain Score 02/27/23 0843 0     Pain Loc --      Pain Edu? --      Excl. in GC? --    No data found.  Updated Vital Signs BP (!) 163/68 (BP Location: Right Arm)   Pulse 96   Temp 98.2 F (36.8 C) (Oral)   Resp 18   SpO2 94%   Visual Acuity Right Eye Distance:   Left Eye Distance:   Bilateral Distance:    Right Eye Near:   Left Eye Near:    Bilateral Near:     Physical Exam Vitals and nursing note reviewed.  Constitutional:      General: She is not in acute distress.    Appearance: Normal appearance. She is not toxic-appearing.  HENT:     Head: Normocephalic and atraumatic.     Mouth/Throat:     Mouth: Mucous membranes are moist.     Pharynx: Oropharynx is clear.  Pulmonary:     Effort: Pulmonary effort is normal. No respiratory distress.  Skin:    General: Skin is warm and dry.     Capillary Refill: Capillary refill takes less than 2 seconds.     Coloration: Skin is not jaundiced or pale.     Findings: Erythema present.          Comments: Tick noted to area marked; no surrounding erythema, fluctuance, warmth.  Neurological:     Mental Status: She is alert and  oriented to person, place, and time.  Psychiatric:        Behavior: Behavior is cooperative.      UC Treatments / Results  Labs (all labs ordered are listed, but only abnormal results are displayed) Labs Reviewed - No data to display  EKG   Radiology No results found.  Procedures Procedures (including critical care time)  Medications Ordered in UC Medications - No data to display  Initial Impression / Assessment and Plan / UC Course  I have reviewed the triage vital signs and the nursing notes.  Pertinent labs & imaging results that were available during my care of the patient were reviewed by me and considered in my medical decision making (see chart for details).   Patient is well-appearing, afebrile, not tachycardic, not tachypneic, oxygenating well on room air.  In triage, patient is mildly hypertensive.  1. Tick bite of right front wall of thorax, initial encounter Recommended removal of tick and patient verbally consented to have tick removed Risks versus benefits discussed and patient wished to proceed Area around tick was cleansed with alcohol swab Forceps were utilized to remove tick Tick was removed completely; wound without any dark material after removal Patient tolerated well Patient declines tick exposure to an at this time as she is allergic to doxycycline Strict ER and return precautions discussed with patient to  include new rash, headache, vision changes or blurred vision, mental status changes, or new joint aches  The patient was given the opportunity to ask questions.  All questions answered to their satisfaction.  The patient is in agreement to this plan.    Final Clinical Impressions(s) / UC Diagnoses   Final diagnoses:  Tick bite of right front wall of thorax, initial encounter     Discharge Instructions      We were able to remove the tick from your skin today  Please keep the area clean and dry-you can cleanse it twice daily with mild soap  and water  If you develop new headache, blurred vision or double vision, mental status changes, new joint pains, or new concerning symptoms over the next couple of weeks, please seek care      ED Prescriptions   None    PDMP not reviewed this encounter.   Valentino Nose, NP 02/27/23 [phone]

## 2023-02-27 NOTE — Discharge Instructions (Addendum)
We were able to remove the tick from your skin today  Please keep the area clean and dry-you can cleanse it twice daily with mild soap and water  If you develop new headache, blurred vision or double vision, mental status changes, new joint pains, or new concerning symptoms over the next couple of weeks, please seek care

## 2023-03-02 ENCOUNTER — Other Ambulatory Visit: Payer: Medicare Other

## 2023-03-02 DIAGNOSIS — E119 Type 2 diabetes mellitus without complications: Secondary | ICD-10-CM

## 2023-03-02 LAB — HEMOGLOBIN A1C: Hgb A1c MFr Bld: 6.6 % — ABNORMAL HIGH (ref 4.6–6.5)

## 2023-03-02 NOTE — Addendum Note (Signed)
Addended by: Marian Sorrow D on: 03/02/2023 09:51 AM   Modules accepted: Orders

## 2023-03-03 ENCOUNTER — Encounter: Payer: Self-pay | Admitting: Family Medicine

## 2023-03-03 ENCOUNTER — Telehealth (INDEPENDENT_AMBULATORY_CARE_PROVIDER_SITE_OTHER): Payer: Medicare Other | Admitting: Family Medicine

## 2023-03-03 DIAGNOSIS — R1010 Upper abdominal pain, unspecified: Secondary | ICD-10-CM

## 2023-03-03 DIAGNOSIS — E119 Type 2 diabetes mellitus without complications: Secondary | ICD-10-CM

## 2023-03-03 NOTE — Progress Notes (Signed)
Subjective:    Patient ID: Rachel Coffey, female    DOB: [birth year], 72 y.o.   MRN: [id number]  HPI Virtual Visit via Video Note  I connected with the patient on 03/03/23 at 10:30 AM EDT by a video enabled telemedicine application and verified that I am speaking with the correct person using two identifiers.  Location patient: home Location provider:work or home office Persons participating in the virtual visit: patient, provider  I discussed the limitations of evaluation and management by telemedicine and the availability of in person appointments. The patient expressed understanding and agreed to proceed.   HPI: Here for 2 issues. First itis time for her to have another colonoscopy. Her last one was on 02-19-13, and a 10 year follow up was recommended. She had been seeing Dr. Marina Goodell, but she would like to change to Dr. Jeani Hawking as her GI specialist. For the past few months she has had intermittent upper abdominal pains and heartburn, although she takes Esomeprazole daily. No fever or nausea. Her BM's are regular. The other issue is she has been concerned about her diabetes. Her last A1c yesterday was 6.6%, but she thinks she is having wide swings in her glucoses. She is averaging 110 to 130 for am fasting readings and they go up and down throughout the day. Sometimes she feels weak and shaky as if the glucose were too low, then she feels better if she eats something. The lowest reading she had had was 80. She has been using up to 4 test strips a day, and of course her insurance will cover only one test per day. She asks if we can order her more strips at a time.    ROS: See pertinent positives and negatives per HPI.  Past Medical History:  Diagnosis Date   Asthma    stress induced with allergies   Cancer    CLL (chronic lymphocytic leukemia)    sees Dr. Truett Perna   Compression fracture of thoracic vertebra    at T11 and T12, sees Dr. Donalee Citrin   Genital warts    Hyperlipidemia     MS (multiple sclerosis)    sees Dr. Anselm Pancoast of [facility] at the [facility] office   Type 2 diabetes mellitus     Past Surgical History:  Procedure Laterality Date   ABDOMINAL HYSTERECTOMY     with 1 ovary removed-[facility]   CHOLECYSTECTOMY N/A 02/25/2013   Procedure: LAPAROSCOPIC CHOLECYSTECTOMY WITH INTRAOPERATIVE CHOLANGIOGRAM;  Surgeon: Velora Heckler, MD;  Location: [facility];  Service: General;  Laterality: N/A;   COLONOSCOPY  02/19/2013   per Dr. Dorena Cookey, clear, repeat in 10 yrs   IR KYPHO THORACIC WITH BONE BIOPSY  05/07/2021   TONSILLECTOMY      Family History  Problem Relation Age of Onset   COPD Mother    Heart disease Mother    Diabetes Father    Heart disease Sister      Current Outpatient Medications:    acetaminophen (TYLENOL) 500 MG tablet, Take 500 mg by mouth as needed., Disp: , Rfl:    ALPRAZolam (XANAX) 0.5 MG tablet, Take 0.5 mg by mouth daily as needed (for pain). , Disp: , Rfl:    baclofen (LIORESAL) 10 MG tablet, Take 5 mg by mouth 3 (three) times daily as needed., Disp: , Rfl:    Cholecalciferol (VITAMIN D) 50 MCG (2000 UT) CAPS, Take 2,000 Units by mouth daily. , Disp: , Rfl:    diclofenac Sodium (VOLTAREN)  1 % GEL, Apply 1 application. topically 4 (four) times daily as needed (pain)., Disp: , Rfl:    esomeprazole (NEXIUM) 20 MG capsule, Take 20 mg by mouth daily., Disp: , Rfl:    fluticasone (FLONASE) 50 MCG/ACT nasal spray, Place 1 spray into both nostrils daily as needed for allergies. Sometimes bid, Disp: , Rfl:    furosemide (LASIX) 20 MG tablet, TAKE ONE TABLET BY MOUTH EVERY MORNING, Disp: 30 tablet, Rfl: 2   glipiZIDE (GLUCOTROL XL) 2.5 MG 24 hr tablet, TAKE ONE TABLET BY MOUTH TWICE A DAY, Disp: 180 tablet, Rfl: 3   glucose blood (CONTOUR NEXT TEST) test strip, Use as instructed, Disp: 100 each, Rfl: 0   ibrutinib (IMBRUVICA) 420 MG tablet, TAKE 1 TABLET BY MOUTH ONCE DAILY.  TABLET SHOULD BE TAKEN WITH A GLASS OF WATER., Disp: 28 tablet,  Rfl: 3   Lancets Misc. MISC, 1 each by Does not apply route as directed., Disp: 100 each, Rfl: 0   loratadine (CLARITIN) 10 MG tablet, Take 10 mg by mouth daily as needed (allergies)., Disp: , Rfl:    losartan (COZAAR) 25 MG tablet, TAKE ONE TABLET (  TOTAL) BY MOUTH DAILY, Disp: 90 tablet, Rfl: 3   MAGNESIUM OXIDE 400 PO, Take 800 mg by mouth daily at 6 (six) AM., Disp: , Rfl:    meloxicam (MOBIC) 15 MG tablet, TAKE ONE (1) TABLET BY MOUTH EVERY DAY AS NEEDED FOR KNEE PAIN, Disp: 30 tablet, Rfl: 0   meloxicam (MOBIC) 15 MG tablet, Take 1 tablet (15 mg total) by mouth daily., Disp: 90 tablet, Rfl: 3   simvastatin (ZOCOR) 20 MG tablet, TAKE ONE TABLET (  TOTAL) BY MOUTH ATBEDTIME, Disp: 90 tablet, Rfl: 3   VENTOLIN HFA 108 (90 Base) MCG/ACT inhaler, INHALE 2 PUFFS EVERY 4 TO 6 HOURS AS NEEDED. SHAKE WELL BEFORE EACH USE., Disp: 18 g, Rfl: 5  EXAM:  VITALS per patient if applicable:  GENERAL: alert, oriented, appears well and in no acute distress  HEENT: atraumatic, conjunttiva clear, no obvious abnormalities on inspection of external nose and ears  NECK: normal movements of the head and neck  LUNGS: on inspection no signs of respiratory distress, breathing rate appears normal, no obvious gross SOB, gasping or wheezing  CV: no obvious cyanosis  MS: moves all visible extremities without noticeable abnormality  PSYCH/NEURO: pleasant and cooperative, no obvious depression or anxiety, speech and thought processing grossly intact  ASSESSMENT AND PLAN: Upper abdominal pain. We agreed to refer her to Dr. Elnoria Howard for her colonoscopy, but we will have her meet with him first about these pains. He may well decide to do an upper endoscopy at the same time. As for her diabetes, we will try to get her covered for up to 4 tests per day due to labile glucose readings.  Gershon Crane, MD  Discussed the following assessment and plan:  No diagnosis found.     I discussed the assessment and  treatment plan with the patient. The patient was provided an opportunity to ask questions and all were answered. The patient agreed with the plan and demonstrated an understanding of the instructions.   The patient was advised to call back or seek an in-person evaluation if the symptoms worsen or if the condition fails to improve as anticipated.      Review of Systems     Objective:   Physical Exam        Assessment & Plan:

## 2023-03-03 NOTE — Telephone Encounter (Signed)
She can use her judgement on the Lasix and just take it as needed

## 2023-03-07 ENCOUNTER — Telehealth: Payer: Self-pay | Admitting: Family Medicine

## 2023-03-07 NOTE — Telephone Encounter (Addendum)
Pt is calling and per pt she need to sch for A1c in three months last A1c was 03-03-2023. Please put order in

## 2023-03-08 ENCOUNTER — Other Ambulatory Visit: Payer: Self-pay

## 2023-03-08 ENCOUNTER — Encounter: Payer: Self-pay | Admitting: Family Medicine

## 2023-03-10 ENCOUNTER — Other Ambulatory Visit: Payer: Self-pay

## 2023-03-10 DIAGNOSIS — E119 Type 2 diabetes mellitus without complications: Secondary | ICD-10-CM

## 2023-03-10 NOTE — Telephone Encounter (Signed)
Order for A1C placed, pt notified to call the office to schedule a 3 month lab appointment for A1C recheck

## 2023-03-13 ENCOUNTER — Other Ambulatory Visit: Payer: Self-pay | Admitting: Family Medicine

## 2023-03-20 ENCOUNTER — Other Ambulatory Visit (HOSPITAL_COMMUNITY): Payer: Self-pay

## 2023-03-23 ENCOUNTER — Other Ambulatory Visit: Payer: Self-pay

## 2023-03-24 ENCOUNTER — Other Ambulatory Visit (HOSPITAL_COMMUNITY): Payer: Self-pay

## 2023-03-27 ENCOUNTER — Other Ambulatory Visit: Payer: Self-pay | Admitting: Family Medicine

## 2023-03-29 ENCOUNTER — Encounter: Payer: Self-pay | Admitting: Oncology

## 2023-03-29 ENCOUNTER — Encounter: Payer: Self-pay | Admitting: Family Medicine

## 2023-03-29 ENCOUNTER — Ambulatory Visit (INDEPENDENT_AMBULATORY_CARE_PROVIDER_SITE_OTHER): Payer: Medicare Other | Admitting: Family Medicine

## 2023-03-29 ENCOUNTER — Telehealth: Payer: Self-pay | Admitting: *Deleted

## 2023-03-29 ENCOUNTER — Other Ambulatory Visit (HOSPITAL_COMMUNITY): Payer: Self-pay

## 2023-03-29 VITALS — BP 118/60 | HR 84 | Temp 98.4°F | Wt 181.0 lb

## 2023-03-29 DIAGNOSIS — R1033 Periumbilical pain: Secondary | ICD-10-CM | POA: Diagnosis not present

## 2023-03-29 DIAGNOSIS — L03032 Cellulitis of left toe: Secondary | ICD-10-CM | POA: Diagnosis not present

## 2023-03-29 DIAGNOSIS — K580 Irritable bowel syndrome with diarrhea: Secondary | ICD-10-CM | POA: Diagnosis not present

## 2023-03-29 DIAGNOSIS — K219 Gastro-esophageal reflux disease without esophagitis: Secondary | ICD-10-CM | POA: Diagnosis not present

## 2023-03-29 MED ORDER — CEPHALEXIN 500 MG PO CAPS: 500.0000 mg | ORAL_CAPSULE | Freq: Three times a day (TID) | ORAL | 0 refills | Status: AC

## 2023-03-29 NOTE — Telephone Encounter (Signed)
Rachel Coffey reports Dr. Elnoria Howard wants to start her on Viberzi 100 mg and asking if OK to take this being on ibrutinib.  Sent message to oral oncology pharmacist to advise. Informed patient she should get an answer tomorrow.

## 2023-03-29 NOTE — Progress Notes (Signed)
   Subjective:    Patient ID: Rachel Coffey, female    DOB: 1951/01/11, 72 y.o.   MRN: [id number]  HPI Here for one week of infection at the tip of her left second toe. The nail has partially come off.   Review of Systems  Constitutional: Negative.   Respiratory: Negative.    Cardiovascular: Negative.        Objective:   Physical Exam Constitutional:      Appearance: Normal appearance.  Cardiovascular:     Rate and Rhythm: Normal rate and regular rhythm.     Pulses: Normal pulses.     Heart sounds: Normal heart sounds.  Pulmonary:     Effort: Pulmonary effort is normal.     Breath sounds: Normal breath sounds.  Skin:    Comments: The area around the left second toe is red, swollen, and tender   Neurological:     Mental Status: She is alert.           Assessment & Plan:  Paronychia, treat with 10 days of Keflex.  Gershon Crane, MD

## 2023-03-30 ENCOUNTER — Telehealth: Payer: Self-pay

## 2023-03-30 ENCOUNTER — Other Ambulatory Visit (HOSPITAL_COMMUNITY): Payer: Self-pay

## 2023-03-30 NOTE — Telephone Encounter (Signed)
  I contacted the patient to inform her that there is no interaction between Viberzi and ibrutinib. The patient acknowledged this information verbally and did not express any additional questions or concerns at this time.

## 2023-03-31 NOTE — Telephone Encounter (Signed)
Yes I would continue to do all these things

## 2023-03-31 NOTE — Telephone Encounter (Signed)
She has no kidney problems, so Viberzi would be safe for her to take

## 2023-04-10 ENCOUNTER — Encounter: Payer: Self-pay | Admitting: Family Medicine

## 2023-04-11 ENCOUNTER — Other Ambulatory Visit: Payer: Self-pay

## 2023-04-11 NOTE — Telephone Encounter (Signed)
FYI

## 2023-04-13 ENCOUNTER — Encounter: Payer: Self-pay | Admitting: Podiatry

## 2023-04-13 ENCOUNTER — Ambulatory Visit (INDEPENDENT_AMBULATORY_CARE_PROVIDER_SITE_OTHER): Payer: Medicare Other | Admitting: Podiatry

## 2023-04-13 VITALS — BP 128/66 | HR 68

## 2023-04-13 DIAGNOSIS — M7752 Other enthesopathy of left foot: Secondary | ICD-10-CM | POA: Diagnosis not present

## 2023-04-13 NOTE — Progress Notes (Signed)
Subjective:  Patient ID: Rachel Coffey, female    DOB: [birth year],  MRN: [id number]  Chief Complaint  Patient presents with   Toe Pain    "That toe is big, red, and swollen.  I've taken two rounds of antibiotics.  It's still very sore and puffy.  My nail came off after the last time I saw him."    72 y.o. female presents with the above complaint.  Patient presents with left third digit red hot swollen joint.  Patient states she is taking round of antibiotics which has not resolved.  It is still painful.  She may be dealing with inflammation rather than infection.  She wanted to discuss other options for this treatment.  She denies any other acute complaints   Review of Systems: Negative except as noted in the HPI. Denies N/V/F/Ch.  Past Medical History:  Diagnosis Date   Asthma    stress induced with allergies   Cancer (HCC)    CLL (chronic lymphocytic leukemia) (HCC)    sees Dr. Truett Perna   Compression fracture of thoracic vertebra (HCC)    at T11 and T12, sees Dr. Donalee Citrin   Genital warts    Hyperlipidemia    MS (multiple sclerosis) [facility])    sees Dr. Anselm Pancoast of [facility] at the [facility] office   Type 2 diabetes mellitus [facility])     Current Outpatient Medications:    acetaminophen (TYLENOL) 500 MG tablet, Take 500 mg by mouth as needed., Disp: , Rfl:    ALPRAZolam (XANAX) 0.5 MG tablet, Take 0.5 mg by mouth daily as needed (for pain). , Disp: , Rfl:    baclofen (LIORESAL) 10 MG tablet, Take 5 mg by mouth 3 (three) times daily as needed., Disp: , Rfl:    Cholecalciferol (VITAMIN D) 50 MCG (2000 UT) CAPS, Take 2,000 Units by mouth daily. , Disp: , Rfl:    diclofenac Sodium (VOLTAREN) 1 % GEL, Apply 1 application. topically 4 (four) times daily as needed (pain)., Disp: , Rfl:    esomeprazole (NEXIUM) 20 MG capsule, Take 20 mg by mouth daily., Disp: , Rfl:    fluticasone (FLONASE) 50 MCG/ACT nasal spray, Place 1 spray into both nostrils daily as needed for allergies.  Sometimes bid, Disp: , Rfl:    furosemide (LASIX) 20 MG tablet, TAKE ONE TABLET BY MOUTH EVERY MORNING, Disp: 30 tablet, Rfl: 2   glipiZIDE (GLUCOTROL XL) 2.5 MG 24 hr tablet, TAKE ONE TABLET BY MOUTH TWICE A DAY, Disp: 180 tablet, Rfl: 3   ibrutinib (IMBRUVICA) 420 MG tablet, TAKE 1 TABLET BY MOUTH ONCE DAILY.  TABLET SHOULD BE TAKEN WITH A GLASS OF WATER., Disp: 28 tablet, Rfl: 3   Lancets Misc. MISC, 1 each by Does not apply route as directed., Disp: 100 each, Rfl: 0   loratadine (CLARITIN) 10 MG tablet, Take 10 mg by mouth daily as needed (allergies)., Disp: , Rfl:    losartan (COZAAR) 25 MG tablet, TAKE ONE TABLET (25MG  TOTAL) BY MOUTH DAILY, Disp: 90 tablet, Rfl: 3   MAGNESIUM OXIDE 400 PO, Take 800 mg by mouth daily at 6 (six) AM., Disp: , Rfl:    meloxicam (MOBIC) 15 MG tablet, TAKE ONE (1) TABLET BY MOUTH EVERY DAY AS NEEDED FOR KNEE PAIN, Disp: 30 tablet, Rfl: 0   simvastatin (ZOCOR) 20 MG tablet, TAKE ONE TABLET (20MG  TOTAL) BY MOUTH ATBEDTIME, Disp: 90 tablet, Rfl: 3   VENTOLIN HFA 108 (90 Base) MCG/ACT inhaler, INHALE 2 PUFFS EVERY 4  TO 6 HOURS AS NEEDED. SHAKE WELL BEFORE EACH USE., Disp: 18 g, Rfl: 5   meloxicam (MOBIC) 15 MG tablet, Take 1 tablet (15 mg total) by mouth daily., Disp: 90 tablet, Rfl: 3  Social History   Tobacco Use  Smoking Status Former   Types: Cigarettes   Quit date: 01/31/2005   Years since quitting: 18.2  Smokeless Tobacco Never    Allergies  Allergen Reactions   Amoxicillin-Pot Clavulanate Other (See Comments)    Pt preferred - causes upset stomach    Codeine Itching   Fenofibrate     aching joints   Hydrocodone Itching   Hydrocodone-Acetaminophen Itching   Lisinopril Cough   Metformin And Related     Makes pt deathly ill   Doxycycline Rash   Objective:   Vitals:   04/13/23 1131  BP: 128/66  Pulse: 68   There is no height or weight on file to calculate BMI. Constitutional Well developed. Well nourished.  Vascular Dorsalis pedis pulses  palpable bilaterally. Posterior tibial pulses palpable bilaterally. Capillary refill normal to all digits.  No cyanosis or clubbing noted. Pedal hair growth normal.  Neurologic Normal speech. Oriented to person, place, and time. Epicritic sensation to light touch grossly present bilaterally.  Dermatologic Pain on palpation left third DIPJ joint.  Pain with range of motion of the joint no pain at the PIPJ joint.  Red hot swollen joint noted  Orthopedic: Normal joint ROM without pain or crepitus bilaterally. No visible deformities. No bony tenderness.   Radiographs: None Assessment:   1. Capsulitis of toe, left    Plan:  Patient was evaluated and treated and all questions answered.  Left third DIPJ joint synovitis with possible underlying inflammatory component -All questions and concerns were discussed with the patient extensive detail -Given that she has failed multiple rounds of antibiotics this likely rules and inflammation rather than infection therefore patient will benefit from steroid injection of decrease inflammatory component associate with pain.  Patient agrees with plan like to proceed with steroid injection -A steroid injection was performed at left third digit PIPJ joint using 1% plain Lidocaine and 10 mg of Kenalog. This was well tolerated.   No follow-ups on file.

## 2023-04-18 ENCOUNTER — Other Ambulatory Visit (HOSPITAL_COMMUNITY): Payer: Self-pay

## 2023-05-02 ENCOUNTER — Ambulatory Visit (INDEPENDENT_AMBULATORY_CARE_PROVIDER_SITE_OTHER): Payer: Medicare Other | Admitting: Podiatry

## 2023-05-02 DIAGNOSIS — M7752 Other enthesopathy of left foot: Secondary | ICD-10-CM

## 2023-05-02 NOTE — Progress Notes (Signed)
Subjective:  Patient ID: Rachel Coffey, female    DOB: Nov 18, 1950,  MRN: [id number]  Chief Complaint  Patient presents with   Toe Pain    "That toe is big, red, and swollen.  I've taken two rounds of antibiotics.  It's still very sore and puffy.  My nail came off after the last time I saw him."    72 y.o. female presents with the above complaint.  Patient presents with follow-up to left third digit red hot swollen joint.  She states doing better.  The injection helped.  Denies any other acute complaints  Review of Systems: Negative except as noted in the HPI. Denies N/V/F/Ch.  Past Medical History:  Diagnosis Date   Asthma    stress induced with allergies   Cancer (HCC)    CLL (chronic lymphocytic leukemia) (HCC)    sees Dr. Truett Perna   Compression fracture of thoracic vertebra (HCC)    at T11 and T12, sees Dr. Donalee Citrin   Genital warts    Hyperlipidemia    MS (multiple sclerosis) [facility])    sees Dr. Anselm Pancoast of [facility] Neurology at the [facility] office   Type 2 diabetes mellitus [facility])     Current Outpatient Medications:    acetaminophen (TYLENOL) 500 MG tablet, Take 500 mg by mouth as needed., Disp: , Rfl:    ALPRAZolam (XANAX) 0.5 MG tablet, Take 0.5 mg by mouth daily as needed (for pain). , Disp: , Rfl:    baclofen (LIORESAL) 10 MG tablet, Take 5 mg by mouth 3 (three) times daily as needed., Disp: , Rfl:    Cholecalciferol (VITAMIN D) 50 MCG (2000 UT) CAPS, Take 2,000 Units by mouth daily. , Disp: , Rfl:    diclofenac Sodium (VOLTAREN) 1 % GEL, Apply 1 application. topically 4 (four) times daily as needed (pain)., Disp: , Rfl:    esomeprazole (NEXIUM) 20 MG capsule, Take 20 mg by mouth daily., Disp: , Rfl:    fluticasone (FLONASE) 50 MCG/ACT nasal spray, Place 1 spray into both nostrils daily as needed for allergies. Sometimes bid, Disp: , Rfl:    furosemide (LASIX) 20 MG tablet, TAKE ONE TABLET BY MOUTH EVERY MORNING, Disp: 30 tablet, Rfl: 2   glipiZIDE (GLUCOTROL XL)  2.5 MG 24 hr tablet, TAKE ONE TABLET BY MOUTH TWICE A DAY, Disp: 180 tablet, Rfl: 3   ibrutinib (IMBRUVICA) 420 MG tablet, TAKE 1 TABLET BY MOUTH ONCE DAILY.  TABLET SHOULD BE TAKEN WITH A GLASS OF WATER., Disp: 28 tablet, Rfl: 3   Lancets Misc. MISC, 1 each by Does not apply route as directed., Disp: 100 each, Rfl: 0   loratadine (CLARITIN) 10 MG tablet, Take 10 mg by mouth daily as needed (allergies)., Disp: , Rfl:    losartan (COZAAR) 25 MG tablet, TAKE ONE TABLET (25MG  TOTAL) BY MOUTH DAILY, Disp: 90 tablet, Rfl: 3   MAGNESIUM OXIDE 400 PO, Take 800 mg by mouth daily at 6 (six) AM., Disp: , Rfl:    meloxicam (MOBIC) 15 MG tablet, TAKE ONE (1) TABLET BY MOUTH EVERY DAY AS NEEDED FOR KNEE PAIN, Disp: 30 tablet, Rfl: 0   simvastatin (ZOCOR) 20 MG tablet, TAKE ONE TABLET (20MG  TOTAL) BY MOUTH ATBEDTIME, Disp: 90 tablet, Rfl: 3   VENTOLIN HFA 108 (90 Base) MCG/ACT inhaler, INHALE 2 PUFFS EVERY 4 TO 6 HOURS AS NEEDED. SHAKE WELL BEFORE EACH USE., Disp: 18 g, Rfl: 5   meloxicam (MOBIC) 15 MG tablet, Take 1 tablet (15 mg total) by  mouth daily., Disp: 90 tablet, Rfl: 3  Social History   Tobacco Use  Smoking Status Former   Types: Cigarettes   Quit date: 01/31/2005   Years since quitting: 18.2  Smokeless Tobacco Never    Allergies  Allergen Reactions   Amoxicillin-Pot Clavulanate Other (See Comments)    Pt preferred - causes upset stomach    Codeine Itching   Fenofibrate     aching joints   Hydrocodone Itching   Hydrocodone-Acetaminophen Itching   Lisinopril Cough   Metformin And Related     Makes pt deathly ill   Doxycycline Rash   Objective:   Vitals:   04/13/23 1131  BP: 128/66  Pulse: 68   There is no height or weight on file to calculate BMI. Constitutional Well developed. Well nourished.  Vascular Dorsalis pedis pulses palpable bilaterally. Posterior tibial pulses palpable bilaterally. Capillary refill normal to all digits.  No cyanosis or clubbing noted. Pedal hair  growth normal.  Neurologic Normal speech. Oriented to person, place, and time. Epicritic sensation to light touch grossly present bilaterally.  Dermatologic No further pain on palpation left third DIPJ joint.  No further pain with range of motion of the joint no pain at the PIPJ joint.  No further red hot swollen joint noted  Orthopedic: Normal joint ROM without pain or crepitus bilaterally. No visible deformities. No bony tenderness.   Radiographs: None Assessment:   1. Capsulitis of toe, left    Plan:  Patient was evaluated and treated and all questions answered.  Left third DIPJ joint synovitis with possible underlying inflammatory component -All questions and concerns were discussed with the patient extensive detail -Clinically resolved with steroid injection.  At this time if any foot and ankle issues arise in future she will come back and see me.  Denies any other acute complaints   No follow-ups on file.

## 2023-05-03 ENCOUNTER — Other Ambulatory Visit: Payer: Self-pay

## 2023-05-03 DIAGNOSIS — K580 Irritable bowel syndrome with diarrhea: Secondary | ICD-10-CM | POA: Diagnosis not present

## 2023-05-08 ENCOUNTER — Other Ambulatory Visit (HOSPITAL_COMMUNITY): Payer: Self-pay

## 2023-05-10 ENCOUNTER — Other Ambulatory Visit (HOSPITAL_COMMUNITY): Payer: Self-pay

## 2023-05-22 ENCOUNTER — Other Ambulatory Visit: Payer: Self-pay | Admitting: Family Medicine

## 2023-05-23 ENCOUNTER — Encounter: Payer: Self-pay | Admitting: Family Medicine

## 2023-05-23 DIAGNOSIS — H2511 Age-related nuclear cataract, right eye: Secondary | ICD-10-CM | POA: Diagnosis not present

## 2023-05-23 DIAGNOSIS — H52222 Regular astigmatism, left eye: Secondary | ICD-10-CM | POA: Diagnosis not present

## 2023-05-23 DIAGNOSIS — H25812 Combined forms of age-related cataract, left eye: Secondary | ICD-10-CM | POA: Diagnosis not present

## 2023-05-23 DIAGNOSIS — H5203 Hypermetropia, bilateral: Secondary | ICD-10-CM | POA: Diagnosis not present

## 2023-05-23 DIAGNOSIS — H524 Presbyopia: Secondary | ICD-10-CM | POA: Diagnosis not present

## 2023-05-23 DIAGNOSIS — H16292 Other keratoconjunctivitis, left eye: Secondary | ICD-10-CM | POA: Diagnosis not present

## 2023-05-23 DIAGNOSIS — E119 Type 2 diabetes mellitus without complications: Secondary | ICD-10-CM | POA: Diagnosis not present

## 2023-05-23 LAB — HM DIABETES EYE EXAM

## 2023-05-29 ENCOUNTER — Other Ambulatory Visit: Payer: Medicare Other

## 2023-06-02 DIAGNOSIS — D649 Anemia, unspecified: Secondary | ICD-10-CM | POA: Diagnosis not present

## 2023-06-02 DIAGNOSIS — D599 Acquired hemolytic anemia, unspecified: Secondary | ICD-10-CM | POA: Diagnosis not present

## 2023-06-02 DIAGNOSIS — M069 Rheumatoid arthritis, unspecified: Secondary | ICD-10-CM | POA: Diagnosis not present

## 2023-06-02 DIAGNOSIS — L309 Dermatitis, unspecified: Secondary | ICD-10-CM | POA: Diagnosis not present

## 2023-06-02 DIAGNOSIS — D519 Vitamin B12 deficiency anemia, unspecified: Secondary | ICD-10-CM | POA: Diagnosis not present

## 2023-06-02 DIAGNOSIS — Z808 Family history of malignant neoplasm of other organs or systems: Secondary | ICD-10-CM | POA: Diagnosis not present

## 2023-06-02 DIAGNOSIS — D2271 Melanocytic nevi of right lower limb, including hip: Secondary | ICD-10-CM | POA: Diagnosis not present

## 2023-06-02 DIAGNOSIS — L570 Actinic keratosis: Secondary | ICD-10-CM | POA: Diagnosis not present

## 2023-06-02 DIAGNOSIS — L821 Other seborrheic keratosis: Secondary | ICD-10-CM | POA: Diagnosis not present

## 2023-06-02 DIAGNOSIS — D225 Melanocytic nevi of trunk: Secondary | ICD-10-CM | POA: Diagnosis not present

## 2023-06-02 DIAGNOSIS — L720 Epidermal cyst: Secondary | ICD-10-CM | POA: Diagnosis not present

## 2023-06-02 DIAGNOSIS — Z85828 Personal history of other malignant neoplasm of skin: Secondary | ICD-10-CM | POA: Diagnosis not present

## 2023-06-02 DIAGNOSIS — Z85820 Personal history of malignant melanoma of skin: Secondary | ICD-10-CM | POA: Diagnosis not present

## 2023-06-02 DIAGNOSIS — L578 Other skin changes due to chronic exposure to nonionizing radiation: Secondary | ICD-10-CM | POA: Diagnosis not present

## 2023-06-07 ENCOUNTER — Other Ambulatory Visit (HOSPITAL_COMMUNITY): Payer: Self-pay

## 2023-06-12 ENCOUNTER — Other Ambulatory Visit: Payer: Self-pay

## 2023-06-29 ENCOUNTER — Encounter (INDEPENDENT_AMBULATORY_CARE_PROVIDER_SITE_OTHER): Payer: Self-pay

## 2023-07-02 ENCOUNTER — Encounter: Payer: Self-pay | Admitting: Emergency Medicine

## 2023-07-02 ENCOUNTER — Ambulatory Visit
Admission: EM | Admit: 2023-07-02 | Discharge: 2023-07-02 | Disposition: A | Discharge status: Home / Self Care | Payer: Medicare Other | Attending: Nurse Practitioner | Admitting: Nurse Practitioner

## 2023-07-02 DIAGNOSIS — S61412A Laceration without foreign body of left hand, initial encounter: Secondary | ICD-10-CM

## 2023-07-02 NOTE — ED Provider Notes (Signed)
[facility]    CSN: [id number] Arrival date & time: 07/02/23  1110      History   Chief Complaint No chief complaint on file.   HPI Rachel Coffey is a 72 y.o. female.   The history is provided by the patient.   Patient presents for complaints of laceration to the palm of the left hand that occurred today when she was cutting up ice cream.  Patient states the knife that she was using went through the container.  She denies fever, chills, numbness of the hand, tingling, swelling, or uncontrolled bleeding.  Patient states that she is on a cancer medication, Imbruvica, that affects bleeding.  Bleeding is controlled at this time.  Patient's last tetanus shot was in 2021.  Past Medical History:  Diagnosis Date   Asthma    stress induced with allergies   Cancer (HCC)    CLL (chronic lymphocytic leukemia) (HCC)    sees Dr. Truett Perna   Compression fracture of thoracic vertebra (HCC)    at T11 and T12, sees Dr. Donalee Citrin   Genital warts    Hyperlipidemia    MS (multiple sclerosis) (HCC)    sees Dr. Anselm Pancoast of [facility] at the [facility] office   Type 2 diabetes mellitus [facility])     Patient Active Problem List   Diagnosis Date Noted   Neuropathy of both feet 02/16/2022   Anxiety disorder 12/16/2020   Asthma without status asthmaticus 12/16/2020   Catarrhal nasal discharge 12/16/2020   Diarrhea 12/16/2020   Diverticular disease of colon 12/16/2020   Dyslipidemia 12/16/2020   Epigastric pain 12/16/2020   Gastroesophageal reflux disease 12/16/2020   Hardening of the aorta (main artery of the heart) (HCC) 12/16/2020   History of lymphoma 12/16/2020   History of malignant neoplasm of skin 12/16/2020   Knee pain 12/16/2020   Lymphoma of intra-abdominal lymph nodes (HCC) 12/16/2020   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   Obesity 12/16/2020   Osteoarthritis 12/16/2020   Spasm 12/16/2020   Vitamin D deficiency 12/16/2020   Lymphoma, small  lymphocytic (HCC) 07/24/2019   Dysphonia 07/05/2018   Globus pharyngeus 06/19/2018   Sudden idiopathic hearing loss of left ear 05/31/2018   Temporomandibular joint (TMJ) pain 05/31/2018   Referred otalgia of left ear 05/24/2018   CLL (chronic lymphocytic leukemia) (HCC) 06/28/2016   Lymphoplasmacytoid lymphoma, CLL 06/24/2016   Lymphadenopathy, retroperitoneal 06/03/2016   Malignant melanoma of arm, right (HCC) 01/13/2016   Weakness of back 01/08/2014   Back pain, lumbosacral 01/08/2014   Cholelithiasis with cholecystitis 02/06/2013   Type 2 diabetes mellitus without complications (HCC) 04/02/2010   HYPERLIPIDEMIA 04/02/2010   MULTIPLE SCLEROSIS 04/02/2010   ALLERGIC RHINITIS 04/02/2010   Asthma 04/02/2010   DIVERTICULITIS, HX OF 04/02/2010    Past Surgical History:  Procedure Laterality Date   ABDOMINAL HYSTERECTOMY     with 1 ovary removed-[facility]   CHOLECYSTECTOMY N/A 02/25/2013   Procedure: LAPAROSCOPIC CHOLECYSTECTOMY WITH INTRAOPERATIVE CHOLANGIOGRAM;  Surgeon: Velora Heckler, MD;  Location: [facility];  Service: General;  Laterality: N/A;   COLONOSCOPY  02/19/2013   per Dr. Dorena Cookey, clear, repeat in 10 yrs   IR KYPHO THORACIC WITH BONE BIOPSY  05/07/2021   TONSILLECTOMY      OB History     Gravida  3   Para  2   Term  2   Preterm      AB  1   Living  2  SAB  1   IAB      Ectopic      Multiple      Live Births               Home Medications    Prior to Admission medications   Medication Sig Start Date End Date Taking? Authorizing Provider  acetaminophen (TYLENOL) 500 MG tablet Take 500 mg by mouth as needed.    [provider]  ALPRAZolam Prudy Feeler) 0.5 MG tablet Take 0.5 mg by mouth daily as needed (for pain).     [provider]  baclofen (LIORESAL) 10 MG tablet Take 5 mg by mouth 3 (three) times daily as needed. 04/15/21   [provider]  Cholecalciferol (VITAMIN D) 50 MCG (2000 UT) CAPS Take 2,000 Units by mouth  daily.     [provider]  diclofenac Sodium (VOLTAREN) 1 % GEL Apply 1 application. topically 4 (four) times daily as needed (pain).    [provider]  esomeprazole (NEXIUM) 20 MG capsule Take 20 mg by mouth daily.    [provider]  fluticasone (FLONASE) 50 MCG/ACT nasal spray Place 1 spray into both nostrils daily as needed for allergies. Sometimes bid    [provider]  furosemide (LASIX) 20 MG tablet TAKE ONE TABLET BY MOUTH EVERY MORNING 05/22/23   Nelwyn Salisbury, MD  glipiZIDE (GLUCOTROL XL) 2.5 MG 24 hr tablet TAKE ONE TABLET BY MOUTH TWICE A DAY 08/16/22   Nelwyn Salisbury, MD  ibrutinib (IMBRUVICA) 420 MG tablet TAKE 1 TABLET BY MOUTH ONCE DAILY.  TABLET SHOULD BE TAKEN WITH A GLASS OF WATER. 02/16/23   Ladene Artist, MD  Lancets Misc. MISC 1 each by Does not apply route as directed. 02/10/22   Nelwyn Salisbury, MD  loratadine (CLARITIN) 10 MG tablet Take 10 mg by mouth daily as needed (allergies).    [provider]  losartan (COZAAR) 25 MG tablet TAKE ONE TABLET (25MG  TOTAL) BY MOUTH DAILY 03/14/23   Nelwyn Salisbury, MD  meloxicam (MOBIC) 15 MG tablet TAKE ONE (1) TABLET BY MOUTH EVERY DAY AS NEEDED FOR KNEE PAIN 02/24/23   Nelwyn Salisbury, MD  simvastatin (ZOCOR) 20 MG tablet TAKE ONE TABLET (20MG  TOTAL) BY MOUTH ATBEDTIME 03/27/23   Nelwyn Salisbury, MD  VENTOLIN HFA 108 (90 Base) MCG/ACT inhaler INHALE 2 PUFFS EVERY 4 TO 6 HOURS AS NEEDED. SHAKE WELL BEFORE EACH USE. 01/28/22   Nelwyn Salisbury, MD    Family History Family History  Problem Relation Age of Onset   COPD Mother    Heart disease Mother    Diabetes Father    Heart disease Sister     Social History Social History   Tobacco Use   Smoking status: Former    Current packs/day: 0.00    Types: Cigarettes    Quit date: 01/31/2005    Years since quitting: 18.4   Smokeless tobacco: Never  Vaping Use   Vaping status: Never Used  Substance Use Topics   Alcohol use: No   Drug use: No      Allergies   Amoxicillin-pot clavulanate, Codeine, Fenofibrate, Hydrocodone, Hydrocodone-acetaminophen, Lisinopril, Metformin and related, and Doxycycline   Review of Systems Review of Systems Per HPI  Physical Exam Triage Vital Signs ED Triage Vitals  Encounter Vitals Group     BP 07/02/23 1148 110/63     Systolic BP Percentile --      Diastolic BP Percentile --  Pulse Rate 07/02/23 1148 83     Resp 07/02/23 1148 18     Temp 07/02/23 1148 98.3 F (36.8 C)     Temp Source 07/02/23 1148 Oral     SpO2 07/02/23 1148 94 %     Weight --      Height --      Head Circumference --      Peak Flow --      Pain Score 07/02/23 1150 1     Pain Loc --      Pain Education --      Exclude from Growth Chart --    No data found.  Updated Vital Signs BP 110/63 (BP Location: Right Arm)   Pulse 83   Temp 98.3 F (36.8 C) (Oral)   Resp 18   SpO2 94%   Visual Acuity Right Eye Distance:   Left Eye Distance:   Bilateral Distance:    Right Eye Near:   Left Eye Near:    Bilateral Near:     Physical Exam Vitals and nursing note reviewed.  Constitutional:      General: She is not in acute distress.    Appearance: Normal appearance.  Eyes:     Extraocular Movements: Extraocular movements intact.     Pupils: Pupils are equal, round, and reactive to light.  Pulmonary:     Effort: Pulmonary effort is normal.  Musculoskeletal:     Cervical back: Normal range of motion.  Skin:    General: Skin is warm and dry.     Findings: Laceration present.     Comments: 1 cm superficial laceration noted to the palmar aspect of the left hand.  Bleeding is controlled at this time.  Neurological:     General: No focal deficit present.     Mental Status: She is alert and oriented to person, place, and time.  Psychiatric:        Mood and Affect: Mood normal.        Behavior: Behavior normal.      UC Treatments / Results  Labs (all labs ordered are listed, but only abnormal results  are displayed) Labs Reviewed - No data to display  EKG   Radiology No results found.  Procedures Laceration Repair  Date/Time: 07/02/2023 12:26 PM  Performed by: Abran Cantor, NP Authorized by: Abran Cantor, NP   Consent:    Consent obtained:  Verbal   Consent given by:  Patient   Risks discussed:  Infection, pain and poor wound healing Universal protocol:    Procedure explained and questions answered to patient or proxy's satisfaction: yes     Patient identity confirmed:  Verbally with patient Anesthesia:    Anesthesia method:  None Laceration details:    Location:  Hand   Hand location:  L palm   Length (cm):  1 Exploration:    Contaminated: no   Treatment:    Wound cleansed with: Wound cleanser.   Amount of cleaning:  Standard Skin repair:    Repair method:  Tissue adhesive Approximation:    Approximation:  Close Repair type:    Repair type:  Simple Post-procedure details:    Dressing:  Antibiotic ointment and non-adherent dressing   Procedure completion:  Tolerated Comments:     1 cm laceration noted to the palmar aspect of the left hand under the fourth digit.  Bleeding is controlled at this time.  Area was cleansed with wound cleanser.  Dermabond was applied.  Antibiotic ointment,  nonadherent gauze, and Coban dressing applied.  Patient tolerated the procedure well.  (including critical care time)  Medications Ordered in UC Medications - No data to display  Initial Impression / Assessment and Plan / UC Course  I have reviewed the triage vital signs and the nursing notes.  Pertinent labs & imaging results that were available during my care of the patient were reviewed by me and considered in my medical decision making (see chart for details).  The patient is well-appearing, she is in no acute distress, vital signs are stable.  Laceration of left palm repaired with Dermabond.  Patient tolerated well.  Supportive care recommendations  were provided and discussed with the patient to include wound care, over-the-counter analgesics for pain or discomfort, and to continue to monitor the area for worsening.  Patient was given strict follow-up precautions.  Patient is in agreement with this plan of care and verbalizes understanding.  All questions were answered.  The patient is stable for discharge.  Final Clinical Impressions(s) / UC Diagnoses   Final diagnoses:  None   Discharge Instructions   None    ED Prescriptions   None    PDMP not reviewed this encounter.   Abran Cantor, NP 07/02/23 1231

## 2023-07-02 NOTE — Discharge Instructions (Addendum)
Dermabond was used to repair the laceration of your left palm. Keep the dressing in place for 24 hours. Remove the dressing and clean the area with warm water in 24 hours.  When you are at home, may leave the area open to air.  May also cover with the Band-Aids we discussed.  When you are out, recommend keeping the area covered. May apply Neosporin to the lacerations as needed. May take over-the-counter Tylenol for pain or discomfort. Follow-up immediately if you develop swelling, increased redness that goes into the hand or up the arm, drainage, or if you develop fever, chills, or other concerns. Follow-up as needed.

## 2023-07-02 NOTE — ED Triage Notes (Signed)
Cut left hand with a knife today.  Was cutting up ice cream in a container and knife went thru container in to left hand.  Last Tdap in 2021

## 2023-07-07 ENCOUNTER — Other Ambulatory Visit: Payer: Self-pay

## 2023-07-10 ENCOUNTER — Other Ambulatory Visit (HOSPITAL_COMMUNITY): Payer: Self-pay

## 2023-07-10 ENCOUNTER — Other Ambulatory Visit: Payer: Self-pay | Admitting: Oncology

## 2023-07-10 ENCOUNTER — Other Ambulatory Visit: Payer: Self-pay

## 2023-07-10 DIAGNOSIS — C9110 Chronic lymphocytic leukemia of B-cell type not having achieved remission: Secondary | ICD-10-CM

## 2023-07-10 MED ORDER — IBRUTINIB 420 MG PO TABS
ORAL_TABLET | ORAL | 3 refills | Status: DC
Start: 2023-07-10 — End: 2023-11-07
  Filled 2023-07-10: qty 28, 28d supply, fill #0
  Filled 2023-07-28: qty 28, 28d supply, fill #1
  Filled 2023-09-01: qty 28, 28d supply, fill #2
  Filled 2023-10-05: qty 28, 28d supply, fill #3

## 2023-07-11 ENCOUNTER — Other Ambulatory Visit (HOSPITAL_COMMUNITY): Payer: Self-pay

## 2023-07-21 ENCOUNTER — Inpatient Hospital Stay: Payer: Medicare Other | Attending: Oncology

## 2023-07-21 ENCOUNTER — Inpatient Hospital Stay (HOSPITAL_BASED_OUTPATIENT_CLINIC_OR_DEPARTMENT_OTHER): Payer: Medicare Other | Admitting: Oncology

## 2023-07-21 VITALS — BP 140/56 | HR 64 | Temp 98.2°F | Resp 18 | Ht 67.0 in | Wt 186.4 lb

## 2023-07-21 DIAGNOSIS — C9111 Chronic lymphocytic leukemia of B-cell type in remission: Secondary | ICD-10-CM | POA: Diagnosis not present

## 2023-07-21 DIAGNOSIS — C9110 Chronic lymphocytic leukemia of B-cell type not having achieved remission: Secondary | ICD-10-CM

## 2023-07-21 DIAGNOSIS — J449 Chronic obstructive pulmonary disease, unspecified: Secondary | ICD-10-CM | POA: Insufficient documentation

## 2023-07-21 DIAGNOSIS — E119 Type 2 diabetes mellitus without complications: Secondary | ICD-10-CM | POA: Diagnosis not present

## 2023-07-21 DIAGNOSIS — Z85820 Personal history of malignant melanoma of skin: Secondary | ICD-10-CM | POA: Insufficient documentation

## 2023-07-21 DIAGNOSIS — G35 Multiple sclerosis: Secondary | ICD-10-CM | POA: Diagnosis not present

## 2023-07-21 LAB — CBC WITH DIFFERENTIAL (CANCER CENTER ONLY)
Abs Immature Granulocytes: 0.02 10*3/uL (ref 0.00–0.07)
Basophils Absolute: 0.1 10*3/uL (ref 0.0–0.1)
Basophils Relative: 1 %
Eosinophils Absolute: 0.1 10*3/uL (ref 0.0–0.5)
Eosinophils Relative: 1 %
HCT: 43.9 % (ref 36.0–46.0)
Hemoglobin: 14.2 g/dL (ref 12.0–15.0)
Immature Granulocytes: 0 %
Lymphocytes Relative: 19 %
Lymphs Abs: 1.6 10*3/uL (ref 0.7–4.0)
MCH: 29.2 pg (ref 26.0–34.0)
MCHC: 32.3 g/dL (ref 30.0–36.0)
MCV: 90.1 fL (ref 80.0–100.0)
Monocytes Absolute: 0.7 10*3/uL (ref 0.1–1.0)
Monocytes Relative: 8 %
Neutro Abs: 6.1 10*3/uL (ref 1.7–7.7)
Neutrophils Relative %: 71 %
Platelet Count: 159 10*3/uL (ref 150–400)
RBC: 4.87 MIL/uL (ref 3.87–5.11)
RDW: 13.2 % (ref 11.5–15.5)
WBC Count: 8.6 10*3/uL (ref 4.0–10.5)
nRBC: 0 % (ref 0.0–0.2)

## 2023-07-21 LAB — CMP (CANCER CENTER ONLY)
ALT: 11 U/L (ref 0–44)
AST: 15 U/L (ref 15–41)
Albumin: 4.1 g/dL (ref 3.5–5.0)
Alkaline Phosphatase: 59 U/L (ref 38–126)
Anion gap: 5 (ref 5–15)
BUN: 23 mg/dL (ref 8–23)
CO2: 33 mmol/L — ABNORMAL HIGH (ref 22–32)
Calcium: 9.3 mg/dL (ref 8.9–10.3)
Chloride: 103 mmol/L (ref 98–111)
Creatinine: 0.87 mg/dL (ref 0.44–1.00)
GFR, Estimated: >60 mL/min (ref >60)
Glucose, Bld: 115 mg/dL — ABNORMAL HIGH (ref 70–99)
Potassium: 4.6 mmol/L (ref 3.5–5.1)
Sodium: 141 mmol/L (ref 135–145)
Total Bilirubin: 0.6 mg/dL (ref 0.3–1.2)
Total Protein: 6.1 g/dL — ABNORMAL LOW (ref 6.5–8.1)

## 2023-07-21 LAB — LACTATE DEHYDROGENASE: LDH: 124 U/L (ref 98–192)

## 2023-07-21 NOTE — Progress Notes (Signed)
[facility] OFFICE PROGRESS NOTE   Diagnosis: CLL  INTERVAL HISTORY:   Rachel Coffey returns as scheduled.  She continues ibrutinib.  She feels well.  No fever, night sweats, or recent infection.  No palpable lymph nodes.  She reports intermittent "spots "on the skin.  She has intermittent episodes of tachycardia lasting minutes.  These occur up to months apart and has been present for several years.  No bleeding.  Objective:  Vital signs in last 24 hours:  Blood pressure (!) 140/56, pulse 64, temperature 98.2 F (36.8 C), temperature source Oral, resp. rate 18, height 5\' 7"  (1.702 m), weight 186 lb 6.4 oz (84.6 kg), SpO2 98%.    HEENT: No thrush, small ecchymosis at the posterior left buccal mucosa Findings: No cervical, supraclavicular, axillary, or inguinal nodes Resp: Distant breath sounds, scattered end inspiratory rhonchi, no respiratory distress Cardio: Regular rate and rhythm GI: No hepatosplenomegaly, no mass, nontender Vascular: No leg edema  Skin: No rash, 2 mm erythematous raised lesion at the right leg   Lab Results:  Lab Results  Component Value Date   WBC 8.6 07/21/2023   HGB 14.2 07/21/2023   HCT 43.9 07/21/2023   MCV 90.1 07/21/2023   PLT 159 07/21/2023   NEUTROABS 6.1 07/21/2023    CMP  Lab Results  Component Value Date   NA 141 07/21/2023   K 4.6 07/21/2023   CL 103 07/21/2023   CO2 33 (H) 07/21/2023   GLUCOSE 115 (H) 07/21/2023   BUN 23 07/21/2023   CREATININE 0.87 07/21/2023   CALCIUM 9.3 07/21/2023   PROT 6.1 (L) 07/21/2023   ALBUMIN 4.1 07/21/2023   AST 15 07/21/2023   ALT 11 07/21/2023   ALKPHOS 59 07/21/2023   BILITOT 0.6 07/21/2023   GFRNONAA >60 07/21/2023   GFRAA >60 06/22/2020     Medications: I have reviewed the patient's current medications.   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 06/20/2016 Monoclonal Kappa restricted B-cell population  identified on flow cytometry Staging PET scan 06/13/2016 confirmed hypermetabolic abdominal/pelvic lymphadenopathy FISH panel positive for 11q-,-12,13q-, and 17p-(10% of cells) Peripheral blood flow cytometry 09/22/2016 at [facility]-CD5/CD23 positive For restricted B-cell population, 2% of lymphocytes Peripheral blood IGH mutation analysis at [facility] 09/22/2016-unmutated CT 06/06/2017-generally stable diffuse lymphadenopathy, node in the pelvis is smaller, some the lymph nodes are larger CTs 07/02/2018- slight decrease in size of retrocrural, retroperitoneal, and mesenteric adenopathy, new 6 mm left upper lobe nodule CT chest 01/01/2019- increase in size of left upper lobe lung nodule-inflammatory appearance, other stable lung nodules, right retrocrural and upper abdominal adenopathy has progressed CTs 07/04/2019- progressive lower thoracic and abdominal adenopathy with dominant mass-effect on the stomach and pancreas, new heterogeneity in the spleen- possible splenic lesion, stable lung nodules Ibrutinib starting 08/14/2019 CT abdomen/pelvis 11/19/2019-decreased size of dominant abdominal mass and decreased abdominal lymphadenopathy CTs 06/22/2020-2 upper abdominal lymph nodes have decreased in size, no evidence of disease progression, unchanged bilateral lung nodules   Stage IB (T1b,N0) melanoma of the right forearm, status post a wide excision and sentinel lymph node biopsy at [facility] March 2017     3.    Multiple sclerosis   4.    Diabetes   5.   COPD  6.  T12 compression fracture after a fall May 22, T12 kyphoplasty 05/07/2021      Disposition: Rachel Coffey appears stable.  She is in clinical remission from CLL.  She will continue ibrutinib.  She is tolerating ibrutinib  well.  I doubt the intermittent episodes of tachycardia are related to ibrutinib.  She will seek medical attention if she has persistent tachycardia.  She will remain up-to-date on influenza and pneumonia vaccines.  She will obtain an  RSV vaccine.  [name] will return for an office and lab visit in 6 months.  Thornton Papas, MD  07/21/2023  11:43 AM

## 2023-07-26 ENCOUNTER — Encounter: Payer: Self-pay | Admitting: Podiatry

## 2023-07-27 ENCOUNTER — Ambulatory Visit (INDEPENDENT_AMBULATORY_CARE_PROVIDER_SITE_OTHER): Payer: Medicare Other | Admitting: Podiatry

## 2023-07-27 DIAGNOSIS — L600 Ingrowing nail: Secondary | ICD-10-CM | POA: Diagnosis not present

## 2023-07-27 NOTE — Progress Notes (Signed)
Subjective:  Patient ID: Rachel Coffey, female    DOB: 02-24-1951,  MRN: [id number]  Chief Complaint  Patient presents with   Nail Problem    Left great toe very sore     72 y.o. female presents with the above complaint.  Patient presents with complaint left hallux medial border ingrown.  She states this started coming back is causing her discomfort she would like to evaluated and have it removed she denies any other acute complaints  Review of Systems: Negative except as noted in the HPI. Denies N/V/F/Ch.  Past Medical History:  Diagnosis Date   Asthma    stress induced with allergies   Cancer (HCC)    CLL (chronic lymphocytic leukemia) (HCC)    sees Dr. Truett Perna   Compression fracture of thoracic vertebra (HCC)    at T11 and T12, sees Dr. Donalee Citrin   Genital warts    Hyperlipidemia    MS (multiple sclerosis) [facility])    sees Dr. Anselm Pancoast of [facility] at the [facility] office   Type 2 diabetes mellitus [facility])     Current Outpatient Medications:    acetaminophen (TYLENOL) 500 MG tablet, Take 500 mg by mouth as needed., Disp: , Rfl:    ALPRAZolam (XANAX) 0.5 MG tablet, Take 0.5 mg by mouth daily as needed (for pain). , Disp: , Rfl:    baclofen (LIORESAL) 10 MG tablet, Take 5 mg by mouth 3 (three) times daily as needed., Disp: , Rfl:    Cholecalciferol (VITAMIN D) 50 MCG (2000 UT) CAPS, Take 2,000 Units by mouth daily. , Disp: , Rfl:    CINNAMON PO, Take 2,000 mg by mouth daily., Disp: , Rfl:    diclofenac Sodium (VOLTAREN) 1 % GEL, Apply 1 application. topically 4 (four) times daily as needed (pain)., Disp: , Rfl:    esomeprazole (NEXIUM) 20 MG capsule, Take 20 mg by mouth daily., Disp: , Rfl:    fluticasone (FLONASE) 50 MCG/ACT nasal spray, Place 1 spray into both nostrils daily as needed for allergies. Sometimes bid, Disp: , Rfl:    furosemide (LASIX) 20 MG tablet, TAKE ONE TABLET BY MOUTH EVERY MORNING, Disp: 30 tablet, Rfl: 2   glipiZIDE (GLUCOTROL XL) 2.5 MG 24 hr  tablet, TAKE ONE TABLET BY MOUTH TWICE A DAY, Disp: 180 tablet, Rfl: 3   ibrutinib (IMBRUVICA) 420 MG tablet, TAKE 1 TABLET BY MOUTH ONCE DAILY.  TABLET SHOULD BE TAKEN WITH A GLASS OF WATER., Disp: 28 tablet, Rfl: 3   Lancets Misc. MISC, 1 each by Does not apply route as directed., Disp: 100 each, Rfl: 0   loratadine (CLARITIN) 10 MG tablet, Take 10 mg by mouth daily as needed (allergies)., Disp: , Rfl:    losartan (COZAAR) 25 MG tablet, TAKE ONE TABLET (25MG  TOTAL) BY MOUTH DAILY, Disp: 90 tablet, Rfl: 3   MAGNESIUM GLUCONATE PO, Take 200 mg by mouth 2 (two) times daily., Disp: , Rfl:    meloxicam (MOBIC) 15 MG tablet, TAKE ONE (1) TABLET BY MOUTH EVERY DAY AS NEEDED FOR KNEE PAIN, Disp: 30 tablet, Rfl: 0   simvastatin (ZOCOR) 20 MG tablet, TAKE ONE TABLET (20MG  TOTAL) BY MOUTH ATBEDTIME, Disp: 90 tablet, Rfl: 3   VENTOLIN HFA 108 (90 Base) MCG/ACT inhaler, INHALE 2 PUFFS EVERY 4 TO 6 HOURS AS NEEDED. SHAKE WELL BEFORE EACH USE., Disp: 18 g, Rfl: 5  Social History   Tobacco Use  Smoking Status Former   Current packs/day: 0.00   Types: Cigarettes  Quit date: 01/31/2005   Years since quitting: 18.4  Smokeless Tobacco Never    Allergies  Allergen Reactions   Amoxicillin-Pot Clavulanate Other (See Comments)    Pt preferred - causes upset stomach    Codeine Itching   Fenofibrate     aching joints   Hydrocodone Itching   Hydrocodone-Acetaminophen Itching   Lisinopril Cough   Metformin And Related     Makes pt deathly ill   Doxycycline Rash   Objective:  There were no vitals filed for this visit. There is no height or weight on file to calculate BMI. Constitutional Well developed. Well nourished.  Vascular Dorsalis pedis pulses palpable bilaterally. Posterior tibial pulses palpable bilaterally. Capillary refill normal to all digits.  No cyanosis or clubbing noted. Pedal hair growth normal.  Neurologic Normal speech. Oriented to person, place, and time. Epicritic sensation to  light touch grossly present bilaterally.  Dermatologic Painful ingrowing nail at medial nail borders of the hallux nail left. No other open wounds. No skin lesions.  Orthopedic: Normal joint ROM without pain or crepitus bilaterally. No visible deformities. No bony tenderness.   Radiographs: None Assessment:   1. Ingrown left greater toenail    Plan:  Patient was evaluated and treated and all questions answered.  Ingrown Nail, left -Patient elects to proceed with minor surgery to remove ingrown toenail removal today. Consent reviewed and signed by patient. -Ingrown nail excised. See procedure note. -Educated on post-procedure care including soaking. Written instructions provided and reviewed. -Patient to follow up in 2 weeks for nail check.  Procedure: Excision of Ingrown Toenail Location: Left 1st toe medial nail borders. Anesthesia: Lidocaine 1% plain; 1.5 mL and Marcaine 0.5% plain; 1.5 mL, digital block. Skin Prep: Betadine. Dressing: Silvadene; telfa; dry, sterile, compression dressing. Technique: Following skin prep, the toe was exsanguinated and a tourniquet was secured at the base of the toe. The affected nail border was freed, split with a nail splitter, and excised. Chemical matrixectomy was then performed with phenol and irrigated out with alcohol. The tourniquet was then removed and sterile dressing applied. Disposition: Patient tolerated procedure well. Patient to return in 2 weeks for follow-up.   No follow-ups on file.

## 2023-07-28 ENCOUNTER — Other Ambulatory Visit (HOSPITAL_COMMUNITY): Payer: Self-pay

## 2023-07-28 ENCOUNTER — Telehealth: Payer: Self-pay

## 2023-07-28 ENCOUNTER — Other Ambulatory Visit: Payer: Self-pay

## 2023-07-28 NOTE — Telephone Encounter (Signed)
Oral Oncology Patient Advocate Encounter  Was successful in securing patient a $8,000.00 grant from [employer] to provide copayment coverage for Imbruvica.  This will keep the out of pocket expense at $0.     [employer] ID: [id number]   The billing information is as follows and has been shared with [facility].    RxBin: [id number] PCN: [id number] Member ID: [id number] Group ID: [id number] Dates of Eligibility: 06/28/23 through 06/26/24  Fund:  Chronic Lymphocytic Leukemia   Ardeen Fillers, CPhT Oncology Pharmacy Patient Advocate  [facility]  [phone] (phone) [phone] (fax) 07/28/2023 10:27 AM

## 2023-08-03 ENCOUNTER — Encounter: Payer: Self-pay | Admitting: Family Medicine

## 2023-08-07 ENCOUNTER — Telehealth: Payer: Self-pay | Admitting: *Deleted

## 2023-08-07 NOTE — Telephone Encounter (Signed)
Having colonoscopy on 10/3 per Dr. Elnoria Howard. Asking if OK to continue the ibrutinib up to 10/2 pm before procedure?

## 2023-08-08 ENCOUNTER — Encounter: Payer: Self-pay | Admitting: Oncology

## 2023-08-09 ENCOUNTER — Encounter: Payer: Self-pay | Admitting: *Deleted

## 2023-08-10 ENCOUNTER — Ambulatory Visit: Payer: Medicare Other | Admitting: Family Medicine

## 2023-08-10 DIAGNOSIS — Z0000 Encounter for general adult medical examination without abnormal findings: Secondary | ICD-10-CM | POA: Diagnosis not present

## 2023-08-10 NOTE — Progress Notes (Signed)
Patient unable to take vitals due to telehealth visit

## 2023-08-10 NOTE — Patient Instructions (Addendum)
I really enjoyed getting to talk with you today! I am available on Tuesdays and Thursdays for virtual visits if you have any questions or concerns, or if I can be of any further assistance.   CHECKLIST FROM ANNUAL WELLNESS VISIT:  -Follow up (please call to schedule if not scheduled after visit):   -yearly for annual wellness visit with primary care office  Here is a list of your preventive care/health maintenance measures and the plan for each if any are due:  PLAN For any measures below that may be due:  -can get the flu and covid shots at the pharmacy  Health Maintenance  Topic Date Due   FOOT EXAM  Never done   Diabetic kidney evaluation - Urine ACR  Never done   Hepatitis C Screening  Never done   Colonoscopy  04/25/2023   COVID-19 Vaccine (8 - 2023-24 season) 07/16/2023   INFLUENZA VACCINE  09/14/2023 (Originally 06/15/2023)   HEMOGLOBIN A1C  09/01/2023   OPHTHALMOLOGY EXAM  05/22/2024   Diabetic kidney evaluation - eGFR measurement  07/20/2024   Medicare Annual Wellness (AWV)  08/09/2024   MAMMOGRAM  12/09/2024   DTaP/Tdap/Td (3 - Td or Tdap) 08/10/2030   Pneumonia Vaccine 67+ Years old  Completed   DEXA SCAN  Completed   HPV VACCINES  Aged Out   Zoster Vaccines- Shingrix  Discontinued    -See a dentist at least yearly  -Get your eyes checked and then per your eye specialist's recommendations  -Other issues addressed today:   -I have included below further information regarding a healthy whole foods based diet, physical activity guidelines for adults, stress management and opportunities for social connections. I hope you find this information useful.   -----------------------------------------------------------------------------------------------------------------------------------------------------------------------------------------------------------------------------------------------------------  NUTRITION: -eat real food: lots of colorful vegetables (half the  plate) and fruits -5-7 servings of vegetables and fruits per day (fresh or steamed is best), exp. 2 servings of vegetables with lunch and dinner and 2 servings of fruit per day. Berries and greens such as kale and collards are great choices.  -consume on a regular basis: whole grains (make sure first ingredient on label contains the word "whole"), fresh fruits, fish, nuts, seeds, healthy oils (such as olive oil, avocado oil, grape seed oil) -may eat small amounts of dairy and lean meat on occasion, but avoid processed meats such as ham, bacon, lunch meat, etc. -drink water -try to avoid fast food and pre-packaged foods, processed meat -most experts advise limiting sodium to < 2300mg  per day, should limit further is any chronic conditions such as high blood pressure, heart disease, diabetes, etc. The American Heart Association advised that < 1500mg  is is ideal -try to avoid foods that contain any ingredients with names you do not recognize  -try to avoid sugar/sweets (except for the natural sugar that occurs in fresh fruit) -try to avoid sweet drinks -try to avoid white rice, white bread, pasta (unless whole grain), white or yellow potatoes  EXERCISE GUIDELINES FOR ADULTS: -if you wish to increase your physical activity, do so gradually and with the approval of your doctor -STOP and seek medical care immediately if you have any chest pain, chest discomfort or trouble breathing when starting or increasing exercise  -move and stretch your body, legs, feet and arms when sitting for long periods -Physical activity guidelines for optimal health in adults: -least 150 minutes per week of aerobic exercise (can talk, but not sing) once approved by your doctor, 20-30 minutes of sustained activity or two 10  minute episodes of sustained activity every day.  -resistance training at least 2 days per week if approved by your doctor -balance exercises 3+ days per week:   Stand somewhere where you have something  sturdy to hold onto if you lose balance.    1) lift up on toes, start with 5x per day and work up to 20x   2) stand and lift on leg straight out to the side so that foot is a few inches of the floor, start with 5x each side and work up to 20x each side   3) stand on one foot, start with 5 seconds each side and work up to 20 seconds on each side  If you need ideas or help with getting more active:  -Silver sneakers https://tools.silversneakers.com  -Walk with a Doc: http://www.duncan-[email or web address]/  -try to include resistance (weight lifting/strength building) and balance exercises twice per week: or the following link for ideas: http://castillo-powell.com/  BuyDucts.dk  STRESS MANAGEMENT: -can try meditating, or just sitting quietly with deep breathing while intentionally relaxing all parts of your body for 5 minutes daily -if you need further help with stress, anxiety or depression please follow up with your primary doctor or contact the wonderful folks at [facility]: [phone]  SOCIAL CONNECTIONS: -options in [place] if you wish to engage in more social and exercise related activities:  -Silver sneakers https://tools.silversneakers.com  -Walk with a Doc: http://www.duncan-williams.com/  -Check out the [facility] Active Adults 50+ section on the [place] [employer] (hiking clubs, book clubs, cards and games, chess, exercise classes, aquatic classes and much more) - see the website for details: https://www[place].gov/departments/parks-recreation/active-adults50  -YouTube has lots of exercise videos for different ages and abilities as well  -[facility] (a variety of indoor and outdoor inperson activities for adults). [phone]. [address]  -Virtual Online Classes (a variety of topics): see seniorplanet.org or call [phone]  -consider  volunteering at a school, hospice center, church, senior center or elsewhere

## 2023-08-10 NOTE — Progress Notes (Signed)
PATIENT CHECK-IN and HEALTH RISK ASSESSMENT QUESTIONNAIRE:  -completed by phone/video for upcoming Medicare Preventive Visit  Pre-Visit Check-in: 1)Vitals (height, wt, BP, etc) - record in vitals section for visit on day of visit Request home vitals (wt, BP, etc.) and enter into vitals, THEN update Vital Signs SmartPhrase below at the top of the HPI. See below.  2)Review and Update Medications, Allergies PMH, Surgeries, Social history in Epic 3)Hospitalizations in the last year with date/reason? No  4)Review and Update Care Team (patient's specialists) in Epic 5) Complete PHQ9 in Epic  6) Complete Fall Screening in Epic 7)Review all Health Maintenance Due and order under PCP if not done.  8)Medicare Wellness Questionnaire: Answer theses question about your habits: Do you drink alcohol? No If yes, how many drinks do you have a day?N/A Have you ever smoked?Yes  Quit date if applicable? 20 years ago   How many packs a day do/did you smoke? 1  Do you use smokeless tobacco? No Do you use an illicit drugs? No Do you exercises? Goes to the gym 3 times per week - rides stationary by, strength training and balance ball exercises. She also does have some weights and equipment at home.Also tries to walk on the weeks for 2 miles.  Typical breakfast: Eggs, Oatmeal, Cereal Typical lunch: Varies - lots of veggies  Typical dinner: Varies - tries to eat lots of veggies Typical snacks: Fruit , Cheese and crackers   Beverages: Water, Tea  Answer theses question about you: Can you perform most household chores? Yes  Do you find it hard to follow a conversation in a noisy room? No Do you often ask people to speak up or repeat themselves? No Do you feel that you have a problem with memory? No Do you balance your checkbook and or bank acounts? Yes Do you feel safe at home? Yes  Last dentist visit? 5 months ago  Do you need assistance with any of the following: Please note if so No  Driving?  Feeding  yourself?  Getting from bed to chair?  Getting to the toilet?  Bathing or showering?  Dressing yourself?  Managing money?  Climbing a flight of stairs  Preparing meals?  Do you have Advanced Directives in place (Living Will, Healthcare Power or Attorney)? Yes   Last eye Exam and location? [facility], Dr. Jimmey Ralph July 2024   Do you currently use prescribed or non-prescribed narcotic or opioid pain medications? No  Do you have a history or close family history of breast, ovarian, tubal or peritoneal cancer or a family member with BRCA (breast cancer susceptibility 1 and 2) gene mutations? No  Request home vitals (wt, BP, etc.) and enter into vitals, THEN update Vital Signs SmartPhrase below at the top of the HPI. See below.   Nurse/Assistant Credentials/time stamp: MG 4:10 PM   ----------------------------------------------------------------------------------------------------------------------------------------------------------------------------------------------------------------------  Because this visit was a virtual/telehealth visit, some criteria may be missing or patient reported. Any vitals not documented were not able to be obtained and vitals that have been documented are patient reported.    MEDICARE ANNUAL PREVENTIVE VISIT WITH PROVIDER: (Welcome to Medicare, initial annual wellness or annual wellness exam)  Virtual Visit via Video Note  I connected with Rachel Coffey on 08/10/23 by a video enabled telemedicine application and verified that I am speaking with the correct person using two identifiers.  Location patient: home Location provider:work or home office Persons participating in the virtual visit: patient, provider  Concerns and/or follow up today: stable,  getting colonoscopy soon and seeing Dr. Clent Ridges next week.    See HM section in Epic for other details of completed HM.    ROS: negative for report of fevers, unintentional weight loss, vision changes,  vision loss, hearing loss or change, chest pain, sob (except has mild exercise induced asthma - has albuterol), hemoptysis, melena, hematochezia, hematuria, falls, bleeding or bruising, thoughts of suicide or self harm, memory loss  Patient-completed extensive health risk assessment - reviewed and discussed with the patient: See Health Risk Assessment completed with patient prior to the visit either above or in recent phone note. This was reviewed in detailed with the patient today and appropriate recommendations, orders and referrals were placed as needed per Summary below and patient instructions.   Review of Medical History: -PMH, PSH, Family History and current specialty and care providers reviewed and updated and listed below   Patient Care Team: Nelwyn Salisbury, MD as PCP - General (Family Medicine) Ladene Artist, MD as Consulting Physician (Oncology)   Past Medical History:  Diagnosis Date   Asthma    stress induced with allergies   Cancer (HCC)    CLL (chronic lymphocytic leukemia) (HCC)    sees Dr. Truett Perna   Compression fracture of thoracic vertebra (HCC)    at T11 and T12, sees Dr. Donalee Citrin   Genital warts    Hyperlipidemia    MS (multiple sclerosis) [facility])    sees Dr. Anselm Pancoast of [facility] at the [facility] office   Type 2 diabetes mellitus [facility])     Past Surgical History:  Procedure Laterality Date   ABDOMINAL HYSTERECTOMY     with 1 ovary removed-[facility]   CHOLECYSTECTOMY N/A 02/25/2013   Procedure: LAPAROSCOPIC CHOLECYSTECTOMY WITH INTRAOPERATIVE CHOLANGIOGRAM;  Surgeon: Velora Heckler, MD;  Location: [facility];  Service: General;  Laterality: N/A;   COLONOSCOPY  02/19/2013   per Dr. Dorena Cookey, clear, repeat in 10 yrs   IR KYPHO THORACIC WITH BONE BIOPSY  05/07/2021   TONSILLECTOMY      Social History   Socioeconomic History   Marital status: Married    Spouse name: Not on file   Number of children: Not on file   Years of education: Not on file    Highest education level: Some college, no degree  Occupational History   Not on file  Tobacco Use   Smoking status: Former    Current packs/day: 0.00    Types: Cigarettes    Quit date: 01/31/2005    Years since quitting: 18.5   Smokeless tobacco: Never  Vaping Use   Vaping status: Never Used  Substance and Sexual Activity   Alcohol use: No   Drug use: No   Sexual activity: Never    Partners: Male    Comment: TVH--still has 1 ovary  Other Topics Concern   Not on file  Social History Narrative   Not on file   Social Determinants of Health   Financial Resource Strain: Low Risk  (08/10/2023)   Overall Financial Resource Strain (CARDIA)    Difficulty of Paying Living Expenses: Not hard at all  Food Insecurity: No Food Insecurity (08/10/2023)   Hunger Vital Sign    Worried About Running Out of Food in the Last Year: Never true    Ran Out of Food in the Last Year: Never true  Transportation Needs: No Transportation Needs (08/10/2023)   PRAPARE - Administrator, Civil Service (Medical): No    Lack of Transportation (  Non-Medical): No  Physical Activity: Insufficiently Active (08/10/2023)   Exercise Vital Sign    Days of Exercise per Week: 3 days    Minutes of Exercise per Session: 40 min  Stress: No Stress Concern Present (08/10/2023)   [employer] of Occupational Health - Occupational Stress Questionnaire    Feeling of Stress : Not at all  Social Connections: Socially Integrated (08/10/2023)   Social Connection and Isolation Panel [NHANES]    Frequency of Communication with Friends and Family: More than three times a week    Frequency of Social Gatherings with Friends and Family: Once a week    Attends Religious Services: More than 4 times per year    Active Member of [employer] or Organizations: Yes    Attends Engineer, structural: More than 4 times per year    Marital Status: Married  Catering manager Violence: Not At Risk (08/10/2023)   Humiliation, Afraid,  Rape, and Kick questionnaire    Fear of Current or Ex-Partner: No    Emotionally Abused: No    Physically Abused: No    Sexually Abused: No    Family History  Problem Relation Age of Onset   COPD Mother    Heart disease Mother    Diabetes Father    Heart disease Sister     Current Outpatient Medications on File Prior to Visit  Medication Sig Dispense Refill   acetaminophen (TYLENOL) 500 MG tablet Take 500 mg by mouth as needed.     ALPRAZolam (XANAX) 0.5 MG tablet Take 0.5 mg by mouth daily as needed (for pain).      baclofen (LIORESAL) 10 MG tablet Take 5 mg by mouth 3 (three) times daily as needed.     Cholecalciferol (VITAMIN D) 50 MCG (2000 UT) CAPS Take 2,000 Units by mouth daily.      CINNAMON PO Take 2,000 mg by mouth daily.     diclofenac Sodium (VOLTAREN) 1 % GEL Apply 1 application. topically 4 (four) times daily as needed (pain).     esomeprazole (NEXIUM) 20 MG capsule Take 20 mg by mouth daily.     fluticasone (FLONASE) 50 MCG/ACT nasal spray Place 1 spray into both nostrils daily as needed for allergies. Sometimes bid     furosemide (LASIX) 20 MG tablet TAKE ONE TABLET BY MOUTH EVERY MORNING 30 tablet 2   glipiZIDE (GLUCOTROL XL) 2.5 MG 24 hr tablet TAKE ONE TABLET BY MOUTH TWICE A DAY 180 tablet 3   ibrutinib (IMBRUVICA) 420 MG tablet TAKE 1 TABLET BY MOUTH ONCE DAILY.  TABLET SHOULD BE TAKEN WITH A GLASS OF WATER. 28 tablet 3   Lancets Misc. MISC 1 each by Does not apply route as directed. 100 each 0   loratadine (CLARITIN) 10 MG tablet Take 10 mg by mouth daily as needed (allergies).     losartan (COZAAR) 25 MG tablet TAKE ONE TABLET (25MG  TOTAL) BY MOUTH DAILY 90 tablet 3   MAGNESIUM GLUCONATE PO Take 200 mg by mouth 2 (two) times daily.     meloxicam (MOBIC) 15 MG tablet TAKE ONE (1) TABLET BY MOUTH EVERY DAY AS NEEDED FOR KNEE PAIN 30 tablet 0   simvastatin (ZOCOR) 20 MG tablet TAKE ONE TABLET (20MG  TOTAL) BY MOUTH ATBEDTIME 90 tablet 3   VENTOLIN HFA 108 (90  Base) MCG/ACT inhaler INHALE 2 PUFFS EVERY 4 TO 6 HOURS AS NEEDED. SHAKE WELL BEFORE EACH USE. 18 g 5   No current facility-administered medications on file prior to visit.  Allergies  Allergen Reactions   Amoxicillin-Pot Clavulanate Other (See Comments)    Pt preferred - causes upset stomach    Codeine Itching   Fenofibrate     aching joints   Hydrocodone Itching   Hydrocodone-Acetaminophen Itching   Lisinopril Cough   Metformin And Related     Makes pt deathly ill   Doxycycline Rash       Physical Exam Vitals requested from patient and listed below if patient had equipment and was able to obtain at home for this virtual visit: There were no vitals filed for this visit. Estimated body mass index is 29.19 kg/m as calculated from the following:   Height as of 07/21/23: 5\' 7"  (1.702 m).   Weight as of 07/21/23: 186 lb 6.4 oz (84.6 kg).  EKG (optional): deferred due to virtual visit  GENERAL: alert, oriented, no acute distress detected, full vision exam deferred due to pandemic and/or virtual encounter  HEENT: atraumatic, conjunttiva clear, no obvious abnormalities on inspection of external nose and ears  NECK: normal movements of the head and neck  LUNGS: on inspection no signs of respiratory distress, breathing rate appears normal, no obvious gross SOB, gasping or wheezing  CV: no obvious cyanosis  MS: moves all visible extremities without noticeable abnormality  PSYCH/NEURO: pleasant and cooperative, no obvious depression or anxiety, speech and thought processing grossly intact, Cognitive function grossly intact  Flowsheet Row Office Visit from 03/29/2023 in [facility] [place]  PHQ-9 Total Score 0           08/10/2023    3:57 PM 03/29/2023    4:53 PM 07/26/2022   11:47 AM 05/30/2022    2:20 PM 04/27/2022    2:32 PM  Depression screen PHQ 2/9  Decreased Interest 0 0 0 0 0  Down, Depressed, Hopeless 0 0 0 0 0  PHQ - 2 Score 0 0 0 0 0   Altered sleeping  0 0 0 0  Tired, decreased energy  0 0 0 0  Change in appetite  0 0 0 0  Feeling bad or failure about yourself   0 0 0 0  Trouble concentrating  0 0 0 0  Moving slowly or fidgety/restless  0 0 0 0  Suicidal thoughts  0 0 0 0  PHQ-9 Score  0 0 0 0  Difficult doing work/chores  Not difficult at all Not difficult at all Not difficult at all Not difficult at all       05/30/2022    2:20 PM 07/15/2022   12:07 PM 07/26/2022   11:51 AM 03/29/2023    4:52 PM 08/10/2023    3:57 PM  Fall Risk  Falls in the past year? 0  0 0 1  Was there an injury with Fall? 0  0 0 1  Fall Risk Category Calculator 0  0 0 2  Fall Risk Category (Retired) Low  Low    (RETIRED) Patient Fall Risk Level Low fall risk Low fall risk Low fall risk    Patient at Risk for Falls Due to No Fall Risks  No Fall Risks No Fall Risks No Fall Risks  Fall risk Follow up Falls evaluation completed  Falls prevention discussed Falls evaluation completed Falls evaluation completed  Had a fall, has MS, balance is not great - sometimes has balance issues and she was on a step ladder and lost her balance. She knows better now.    SUMMARY AND PLAN:  Encounter for Medicare  annual wellness exam   Discussed applicable health maintenance/preventive health measures and advised and referred or ordered per patient preferences: -she reports her oncologist told her that she can not have the shingrix vaccine -she plans to get her flu and covid shot tomorrow - advised to let us know once she dose so that we can update -she is seeing her PCP soon and plans to do labs and foot exam then -she is getting her colonoscopy soon  Health Maintenance  Topic Date Due   FOOT EXAM  Never done   Diabetic kidney evaluation - Urine ACR  Never done   Hepatitis C Screening  Never done   Colonoscopy  04/25/2023   COVID-19 Vaccine (8 - 2023-24 season) 07/16/2023   INFLUENZA VACCINE  09/14/2023 (Originally 06/15/2023)   HEMOGLOBIN A1C   09/01/2023   OPHTHALMOLOGY EXAM  05/22/2024   Diabetic kidney evaluation - eGFR measurement  07/20/2024   Medicare Annual Wellness (AWV)  08/09/2024   MAMMOGRAM  12/09/2024   DTaP/Tdap/Td (3 - Td or Tdap) 08/10/2030   Pneumonia Vaccine 8+ Years old  Completed   DEXA SCAN  Completed   HPV VACCINES  Aged Out   Zoster Vaccines- Shingrix  Discontinued      Education and counseling on the following was provided based on the above review of health and a plan/checklist for the patient, along with additional information discussed, was provided for the patient in the patient instructions :   -Provided safe balance exercises that can be done at home to improve balance and discussed exercise guidelines for adults with include balance exercises at least 3 days per week.  -Advised and counseled on a healthy lifestyle  -Reviewed patient's current diet. Advised and counseled on a whole foods based healthy diet. A summary of a healthy diet was provided in the Patient Instructions.  -reviewed patient's current physical activity level and discussed exercise guidelines for adults. Discussed community resources and ideas for safe exercise at home to assist in meeting exercise guideline recommendations in a safe and healthy way.  -Advise yearly dental visits at minimum and regular eye exams   Follow up: see patient instructions     Patient Instructions  I really enjoyed getting to talk with you today! I am available on Tuesdays and Thursdays for virtual visits if you have any questions or concerns, or if I can be of any further assistance.   CHECKLIST FROM ANNUAL WELLNESS VISIT:  -Follow up (please call to schedule if not scheduled after visit):   -yearly for annual wellness visit with primary care office  Here is a list of your preventive care/health maintenance measures and the plan for each if any are due:  PLAN For any measures below that may be due:  -can get the flu and covid shots at the  pharmacy  Health Maintenance  Topic Date Due   FOOT EXAM  Never done   Diabetic kidney evaluation - Urine ACR  Never done   Hepatitis C Screening  Never done   Colonoscopy  04/25/2023   COVID-19 Vaccine (8 - 2023-24 season) 07/16/2023   INFLUENZA VACCINE  09/14/2023 (Originally 06/15/2023)   HEMOGLOBIN A1C  09/01/2023   OPHTHALMOLOGY EXAM  05/22/2024   Diabetic kidney evaluation - eGFR measurement  07/20/2024   Medicare Annual Wellness (AWV)  08/09/2024   MAMMOGRAM  12/09/2024   DTaP/Tdap/Td (3 - Td or Tdap) 08/10/2030   Pneumonia Vaccine 67+ Years old  Completed   DEXA SCAN  Completed   HPV  VACCINES  Aged Out   Zoster Vaccines- Shingrix  Discontinued    -See a dentist at least yearly  -Get your eyes checked and then per your eye specialist's recommendations  -Other issues addressed today:   -I have included below further information regarding a healthy whole foods based diet, physical activity guidelines for adults, stress management and opportunities for social connections. I hope you find this information useful.   -----------------------------------------------------------------------------------------------------------------------------------------------------------------------------------------------------------------------------------------------------------  NUTRITION: -eat real food: lots of colorful vegetables (half the plate) and fruits -5-7 servings of vegetables and fruits per day (fresh or steamed is best), exp. 2 servings of vegetables with lunch and dinner and 2 servings of fruit per day. Berries and greens such as kale and collards are great choices.  -consume on a regular basis: whole grains (make sure first ingredient on label contains the word "whole"), fresh fruits, fish, nuts, seeds, healthy oils (such as olive oil, avocado oil, grape seed oil) -may eat small amounts of dairy and lean meat on occasion, but avoid processed meats such as ham, bacon, lunch meat,  etc. -drink water -try to avoid fast food and pre-packaged foods, processed meat -most experts advise limiting sodium to < 2300mg  per day, should limit further is any chronic conditions such as high blood pressure, heart disease, diabetes, etc. The American Heart Association advised that < 1500mg  is is ideal -try to avoid foods that contain any ingredients with names you do not recognize  -try to avoid sugar/sweets (except for the natural sugar that occurs in fresh fruit) -try to avoid sweet drinks -try to avoid white rice, white bread, pasta (unless whole grain), white or yellow potatoes  EXERCISE GUIDELINES FOR ADULTS: -if you wish to increase your physical activity, do so gradually and with the approval of your doctor -STOP and seek medical care immediately if you have any chest pain, chest discomfort or trouble breathing when starting or increasing exercise  -move and stretch your body, legs, feet and arms when sitting for long periods -Physical activity guidelines for optimal health in adults: -least 150 minutes per week of aerobic exercise (can talk, but not sing) once approved by your doctor, 20-30 minutes of sustained activity or two 10 minute episodes of sustained activity every day.  -resistance training at least 2 days per week if approved by your doctor -balance exercises 3+ days per week:   Stand somewhere where you have something sturdy to hold onto if you lose balance.    1) lift up on toes, start with 5x per day and work up to 20x   2) stand and lift on leg straight out to the side so that foot is a few inches of the floor, start with 5x each side and work up to 20x each side   3) stand on one foot, start with 5 seconds each side and work up to 20 seconds on each side  If you need ideas or help with getting more active:  -Silver sneakers https://tools.silversneakers.com  -Walk with a Doc: http://www.duncan-williams.com/  -try to include resistance (weight lifting/strength  building) and balance exercises twice per week: or the following link for ideas: http://castillo-powell.com/  BuyDucts.dk  STRESS MANAGEMENT: -can try meditating, or just sitting quietly with deep breathing while intentionally relaxing all parts of your body for 5 minutes daily -if you need further help with stress, anxiety or depression please follow up with your primary doctor or contact the wonderful folks at [facility]: [phone]  SOCIAL CONNECTIONS: -options in [place] if you  wish to engage in more social and exercise related activities:  -Silver sneakers https://tools.silversneakers.com  -Walk with a Doc: http://www.duncan-williams.com/  -Check out the [facility] Active Adults 50+ section on the [place] of [employer] (hiking clubs, book clubs, cards and games, chess, exercise classes, aquatic classes and much more) - see the website for details: https://www.[place].gov/departments/parks-recreation/active-adults50  -YouTube has lots of exercise videos for different ages and abilities as well  -[facility] (a variety of indoor and outdoor inperson activities for adults). ([phone]. [address]  -Virtual Online Classes (a variety of topics): see seniorplanet.org or call [phone]  -consider volunteering at a school, hospice center, church, senior center or elsewhere           Terressa Koyanagi, DO

## 2023-08-11 DIAGNOSIS — Z23 Encounter for immunization: Secondary | ICD-10-CM | POA: Diagnosis not present

## 2023-08-14 ENCOUNTER — Ambulatory Visit (INDEPENDENT_AMBULATORY_CARE_PROVIDER_SITE_OTHER): Payer: Medicare Other | Admitting: Family Medicine

## 2023-08-14 ENCOUNTER — Encounter: Payer: Self-pay | Admitting: Family Medicine

## 2023-08-14 VITALS — BP 122/70 | HR 75 | Temp 98.6°F | Ht 67.0 in | Wt 183.0 lb

## 2023-08-14 DIAGNOSIS — F419 Anxiety disorder, unspecified: Secondary | ICD-10-CM | POA: Diagnosis not present

## 2023-08-14 DIAGNOSIS — E785 Hyperlipidemia, unspecified: Secondary | ICD-10-CM | POA: Diagnosis not present

## 2023-08-14 DIAGNOSIS — Z7984 Long term (current) use of oral hypoglycemic drugs: Secondary | ICD-10-CM

## 2023-08-14 DIAGNOSIS — M159 Polyosteoarthritis, unspecified: Secondary | ICD-10-CM

## 2023-08-14 DIAGNOSIS — J4520 Mild intermittent asthma, uncomplicated: Secondary | ICD-10-CM

## 2023-08-14 DIAGNOSIS — E559 Vitamin D deficiency, unspecified: Secondary | ICD-10-CM | POA: Diagnosis not present

## 2023-08-14 DIAGNOSIS — C9110 Chronic lymphocytic leukemia of B-cell type not having achieved remission: Secondary | ICD-10-CM

## 2023-08-14 DIAGNOSIS — M150 Primary generalized (osteo)arthritis: Secondary | ICD-10-CM

## 2023-08-14 DIAGNOSIS — E119 Type 2 diabetes mellitus without complications: Secondary | ICD-10-CM

## 2023-08-14 DIAGNOSIS — K219 Gastro-esophageal reflux disease without esophagitis: Secondary | ICD-10-CM

## 2023-08-14 LAB — LIPID PANEL
Cholesterol: 159 mg/dL (ref 0–200)
HDL: 48.6 mg/dL (ref >39.00)
LDL Cholesterol: 73 mg/dL (ref 0–99)
NonHDL: 110.83
Total CHOL/HDL Ratio: 3
Triglycerides: 187 mg/dL — ABNORMAL HIGH (ref 0.0–149.0)
VLDL: 37.4 mg/dL (ref 0.0–40.0)

## 2023-08-14 LAB — VITAMIN D 25 HYDROXY (VIT D DEFICIENCY, FRACTURES): VITD: 42.55 ng/mL (ref 30.00–100.00)

## 2023-08-14 LAB — TSH: TSH: 4.2 u[IU]/mL (ref 0.35–5.50)

## 2023-08-14 LAB — HEMOGLOBIN A1C: Hgb A1c MFr Bld: 6.8 % — ABNORMAL HIGH (ref 4.6–6.5)

## 2023-08-14 MED ORDER — GLIPIZIDE ER 2.5 MG PO TB24: 2.5000 mg | ORAL_TABLET | Freq: Two times a day (BID) | ORAL | 3 refills | Status: DC

## 2023-08-14 NOTE — Progress Notes (Signed)
Subjective:    Patient ID: Rachel Coffey, female    DOB: Aug 25, 1951, 72 y.o.   MRN: [id number]  HPI Here to follow up on issues. She feels well in general. She watches her diet and she either walks or rides a stationary bike every day. She sees Dr. Truett Perna for her CLL, and this has been stable. She had labs drawn on 07-21-23 which were unremarkable. She sees Dr. Allena Katz for podiatric care.    Review of Systems  Constitutional: Negative.   HENT: Negative.    Eyes: Negative.   Respiratory: Negative.    Cardiovascular: Negative.   Gastrointestinal: Negative.   Genitourinary:  Negative for decreased urine volume, difficulty urinating, dyspareunia, dysuria, enuresis, flank pain, frequency, hematuria, pelvic pain and urgency.  Musculoskeletal:  Positive for arthralgias.  Skin: Negative.   Neurological: Negative.  Negative for headaches.  Psychiatric/Behavioral: Negative.         Objective:   Physical Exam Constitutional:      General: She is not in acute distress.    Appearance: Normal appearance. She is well-developed.  HENT:     Head: Normocephalic and atraumatic.     Right Ear: External ear normal.     Left Ear: External ear normal.     Nose: Nose normal.     Mouth/Throat:     Pharynx: No oropharyngeal exudate.  Eyes:     General: No scleral icterus.    Conjunctiva/sclera: Conjunctivae normal.     Pupils: Pupils are equal, round, and reactive to light.  Neck:     Thyroid: No thyromegaly.     Vascular: No JVD.  Cardiovascular:     Rate and Rhythm: Normal rate and regular rhythm.     Pulses: Normal pulses.     Heart sounds: Normal heart sounds. No murmur heard.    No friction rub. No gallop.  Pulmonary:     Effort: Pulmonary effort is normal. No respiratory distress.     Breath sounds: Normal breath sounds. No wheezing or rales.  Chest:     Chest wall: No tenderness.  Abdominal:     General: Abdomen is flat. Bowel sounds are normal. There is no distension.      Palpations: Abdomen is soft. There is no mass.     Tenderness: There is no abdominal tenderness. There is no guarding or rebound.  Musculoskeletal:        General: No tenderness. Normal range of motion.     Cervical back: Normal range of motion and neck supple.  Lymphadenopathy:     Cervical: No cervical adenopathy.  Skin:    General: Skin is warm and dry.     Findings: No erythema or rash.  Neurological:     General: No focal deficit present.     Mental Status: She is alert and oriented to person, place, and time.     Cranial Nerves: No cranial nerve deficit.     Motor: No abnormal muscle tone.     Coordination: Coordination normal.     Deep Tendon Reflexes: Reflexes are normal and symmetric. Reflexes normal.  Psychiatric:        Mood and Affect: Mood normal.        Behavior: Behavior normal.        Thought Content: Thought content normal.        Judgment: Judgment normal.           Assessment & Plan:  Her CLL is stable. She is scheduled for a colonoscopy  per Dr. Elnoria Howard on 08-17-23. We will get fasting labs to check lipids, an A1c, etc. Her anxiety and GERD and OA are stable. We spent a total of ( 34  ) minutes reviewing records and discussing these issues.  Gershon Crane, MD  Gershon Crane, MD

## 2023-08-16 ENCOUNTER — Telehealth: Payer: Self-pay | Admitting: Family Medicine

## 2023-08-16 NOTE — Telephone Encounter (Signed)
Prepping for colonoscopy, blood sugar level is 86. Asking should she take her glipiZIDE (GLUCOTROL XL) 2.5 MG 24 hr tablet at 6pm tonight.

## 2023-08-17 DIAGNOSIS — Q438 Other specified congenital malformations of intestine: Secondary | ICD-10-CM | POA: Diagnosis not present

## 2023-08-17 DIAGNOSIS — D122 Benign neoplasm of ascending colon: Secondary | ICD-10-CM | POA: Diagnosis not present

## 2023-08-17 DIAGNOSIS — K5730 Diverticulosis of large intestine without perforation or abscess without bleeding: Secondary | ICD-10-CM | POA: Diagnosis not present

## 2023-08-17 DIAGNOSIS — Z860100 Personal history of colon polyps, unspecified: Secondary | ICD-10-CM | POA: Diagnosis not present

## 2023-08-17 DIAGNOSIS — K635 Polyp of colon: Secondary | ICD-10-CM | POA: Diagnosis not present

## 2023-08-17 DIAGNOSIS — Z1211 Encounter for screening for malignant neoplasm of colon: Secondary | ICD-10-CM | POA: Diagnosis not present

## 2023-08-17 DIAGNOSIS — D125 Benign neoplasm of sigmoid colon: Secondary | ICD-10-CM | POA: Diagnosis not present

## 2023-08-17 HISTORY — PX: COLONOSCOPY: SHX174

## 2023-08-17 LAB — HM COLONOSCOPY

## 2023-08-17 NOTE — Telephone Encounter (Signed)
Spoke with pt stated that she had the procedure this morning but she held the Glipizide am dose

## 2023-08-17 NOTE — Telephone Encounter (Signed)
No skip the Glipizide before the procedure

## 2023-08-24 ENCOUNTER — Ambulatory Visit (INDEPENDENT_AMBULATORY_CARE_PROVIDER_SITE_OTHER): Payer: Medicare Other | Admitting: Podiatry

## 2023-08-24 ENCOUNTER — Encounter: Payer: Self-pay | Admitting: Podiatry

## 2023-08-24 VITALS — BP 142/67 | HR 79

## 2023-08-24 DIAGNOSIS — M7752 Other enthesopathy of left foot: Secondary | ICD-10-CM

## 2023-08-24 NOTE — Progress Notes (Signed)
Subjective:  Patient ID: Rachel Coffey, female    DOB: 1951/06/29,  MRN: [id number]  Chief Complaint  Patient presents with   Toe Pain    "Dr. Clent Ridges said I was developing a Hammer Toe.  He felt the redness of my toe may be because of the toe.  He said Dr. Allena Katz may be able to prevent surgery.  The toe is getting sore again, I may need another shot."    72 y.o. female presents with the above complaint.  Patient presents with left second digit red hot swollen joint.  I did came back again.  Denies any other acute complaints.  Also discussed about injection  Review of Systems: Negative except as noted in the HPI. Denies N/V/F/Ch.  Past Medical History:  Diagnosis Date   Asthma    stress induced with allergies   Cancer (HCC)    CLL (chronic lymphocytic leukemia) (HCC)    sees Dr. Truett Perna   Compression fracture of thoracic vertebra (HCC)    at T11 and T12, sees Dr. Donalee Citrin   Genital warts    Hyperlipidemia    MS (multiple sclerosis) [facility])    sees Dr. Anselm Pancoast of [facility] at the [facility] office   Type 2 diabetes mellitus [facility])     Current Outpatient Medications:    acetaminophen (TYLENOL) 500 MG tablet, Take 500 mg by mouth as needed., Disp: , Rfl:    ALPRAZolam (XANAX) 0.5 MG tablet, Take 0.5 mg by mouth daily as needed (for pain). , Disp: , Rfl:    baclofen (LIORESAL) 10 MG tablet, Take 5 mg by mouth 3 (three) times daily as needed., Disp: , Rfl:    Cholecalciferol (VITAMIN D) 50 MCG (2000 UT) CAPS, Take 2,000 Units by mouth daily. , Disp: , Rfl:    CINNAMON PO, Take 2,000 mg by mouth daily., Disp: , Rfl:    diclofenac Sodium (VOLTAREN) 1 % GEL, Apply 1 application. topically 4 (four) times daily as needed (pain)., Disp: , Rfl:    esomeprazole (NEXIUM) 20 MG capsule, Take 20 mg by mouth daily., Disp: , Rfl:    fluticasone (FLONASE) 50 MCG/ACT nasal spray, Place 1 spray into both nostrils daily as needed for allergies. Sometimes bid, Disp: , Rfl:    glipiZIDE  (GLUCOTROL XL) 2.5 MG 24 hr tablet, Take 1 tablet (2.5 mg total) by mouth 2 (two) times daily., Disp: 180 tablet, Rfl: 3   ibrutinib (IMBRUVICA) 420 MG tablet, TAKE 1 TABLET BY MOUTH ONCE DAILY.  TABLET SHOULD BE TAKEN WITH A GLASS OF WATER., Disp: 28 tablet, Rfl: 3   Lancets Misc. MISC, 1 each by Does not apply route as directed., Disp: 100 each, Rfl: 0   loratadine (CLARITIN) 10 MG tablet, Take 10 mg by mouth daily as needed (allergies)., Disp: , Rfl:    losartan (COZAAR) 25 MG tablet, TAKE ONE TABLET (25MG  TOTAL) BY MOUTH DAILY, Disp: 90 tablet, Rfl: 3   MAGNESIUM GLUCONATE PO, Take 200 mg by mouth 2 (two) times daily., Disp: , Rfl:    meloxicam (MOBIC) 15 MG tablet, TAKE ONE (1) TABLET BY MOUTH EVERY DAY AS NEEDED FOR KNEE PAIN, Disp: 30 tablet, Rfl: 0   simvastatin (ZOCOR) 20 MG tablet, TAKE ONE TABLET (20MG  TOTAL) BY MOUTH ATBEDTIME, Disp: 90 tablet, Rfl: 3   VENTOLIN HFA 108 (90 Base) MCG/ACT inhaler, INHALE 2 PUFFS EVERY 4 TO 6 HOURS AS NEEDED. SHAKE WELL BEFORE EACH USE., Disp: 18 g, Rfl: 5  Social History  Tobacco Use  Smoking Status Former   Current packs/day: 0.00   Types: Cigarettes   Quit date: 01/31/2005   Years since quitting: 18.5  Smokeless Tobacco Never    Allergies  Allergen Reactions   Amoxicillin-Pot Clavulanate Other (See Comments)    Pt preferred - causes upset stomach    Codeine Itching   Fenofibrate     aching joints   Hydrocodone Itching   Hydrocodone-Acetaminophen Itching   Lisinopril Cough   Metformin And Related     Makes pt deathly ill   Doxycycline Rash   Objective:   Vitals:   08/24/23 0925  BP: (!) 142/67  Pulse: 79   There is no height or weight on file to calculate BMI. Constitutional Well developed. Well nourished.  Vascular Dorsalis pedis pulses palpable bilaterally. Posterior tibial pulses palpable bilaterally. Capillary refill normal to all digits.  No cyanosis or clubbing noted. Pedal hair growth normal.  Neurologic Normal  speech. Oriented to person, place, and time. Epicritic sensation to light touch grossly present bilaterally.  Dermatologic Pain on palpation left secondDIPJ joint.  Pain with range of motion of the joint no pain at the PIPJ joint.  Red hot swollen joint noted  Orthopedic: Normal joint ROM without pain or crepitus bilaterally. No visible deformities. No bony tenderness.   Radiographs: None Assessment:   No diagnosis found.  Plan:  Patient was evaluated and treated and all questions answered.  Left second DIPJ joint synovitis with possible underlying inflammatory component -All questions and concerns were discussed with the patient extensive detail -Given that she has failed multiple rounds of antibiotics this likely rules and inflammation rather than infection therefore patient will benefit from steroid injection of decrease inflammatory component associate with pain.  Patient agrees with plan like to proceed with steroid injection -A steroid injection was performed at left third digit PIPJ joint using 1% plain Lidocaine and 10 mg of Kenalog. This was well tolerated.   No follow-ups on file.

## 2023-08-29 DIAGNOSIS — G35 Multiple sclerosis: Secondary | ICD-10-CM | POA: Diagnosis not present

## 2023-09-01 ENCOUNTER — Other Ambulatory Visit: Payer: Self-pay

## 2023-09-01 NOTE — Progress Notes (Signed)
Specialty Pharmacy Refill Coordination Note  Rachel Coffey is a 72 y.o. female contacted today regarding refills of specialty medication(s) Ibrutinib   Patient requested Delivery   Delivery date: 09/15/23   Verified address: [address]  [place] [address]   Medication will be filled on 09/14/23.

## 2023-09-04 ENCOUNTER — Encounter: Payer: Self-pay | Admitting: Family Medicine

## 2023-09-04 DIAGNOSIS — D519 Vitamin B12 deficiency anemia, unspecified: Secondary | ICD-10-CM | POA: Diagnosis not present

## 2023-09-04 DIAGNOSIS — D599 Acquired hemolytic anemia, unspecified: Secondary | ICD-10-CM | POA: Diagnosis not present

## 2023-09-04 DIAGNOSIS — D649 Anemia, unspecified: Secondary | ICD-10-CM | POA: Diagnosis not present

## 2023-09-04 DIAGNOSIS — M069 Rheumatoid arthritis, unspecified: Secondary | ICD-10-CM | POA: Diagnosis not present

## 2023-09-05 ENCOUNTER — Encounter: Payer: Self-pay | Admitting: Family Medicine

## 2023-09-07 ENCOUNTER — Ambulatory Visit: Payer: Medicare Other | Admitting: Podiatry

## 2023-10-05 ENCOUNTER — Other Ambulatory Visit: Payer: Self-pay

## 2023-10-05 ENCOUNTER — Other Ambulatory Visit (HOSPITAL_COMMUNITY): Payer: Self-pay

## 2023-10-05 DIAGNOSIS — Z23 Encounter for immunization: Secondary | ICD-10-CM | POA: Diagnosis not present

## 2023-10-05 NOTE — Progress Notes (Signed)
Specialty Pharmacy Refill Coordination Note  Rachel Coffey is a 72 y.o. female contacted today regarding refills of specialty medication(s) Ibrutinib   Patient requested Delivery   Delivery date: 10/17/23   Verified address: [address]  [place] [address]   Medication will be filled on 10/16/23.

## 2023-10-10 ENCOUNTER — Encounter: Payer: Self-pay | Admitting: Family Medicine

## 2023-10-16 ENCOUNTER — Other Ambulatory Visit: Payer: Self-pay

## 2023-11-01 ENCOUNTER — Ambulatory Visit
Admission: RE | Admit: 2023-11-01 | Discharge: 2023-11-01 | Disposition: A | Discharge status: Home / Self Care | Payer: Medicare Other | Source: Ambulatory Visit | Attending: Family Medicine | Admitting: Family Medicine

## 2023-11-01 VITALS — BP 150/83 | HR 88 | Temp 98.0°F | Resp 17

## 2023-11-01 DIAGNOSIS — B372 Candidiasis of skin and nail: Secondary | ICD-10-CM

## 2023-11-01 NOTE — ED Provider Notes (Signed)
[facility]    CSN: [id number] Arrival date & time: 11/01/23  1240      History   Chief Complaint Chief Complaint  Patient presents with   Vaginal Itching    Yeast on skin under treatment on lower abdomen however now I am experiencing itching in my vaginal and anal area - Entered by patient    HPI Rachel Coffey is a 72 y.o. female.   Patient presenting today with recurrent yeast infections.  States she has an area to the right lower abdomen in a skin fold and now vaginal and anal itching.  This has been an issue for her in the past numerous times.  She has been trying nystatin cream, over-the-counter powders with mild temporary benefit.  She is wanting to know what she can use on the anal area in the vaginal area.      Past Medical History:  Diagnosis Date   Asthma    stress induced with allergies   Cancer (HCC)    CLL (chronic lymphocytic leukemia) (HCC)    sees Dr. Truett Perna   Compression fracture of thoracic vertebra (HCC)    at T11 and T12, sees Dr. Donalee Citrin   Genital warts    Hyperlipidemia    MS (multiple sclerosis) (HCC)    sees Dr. Anselm Pancoast of [facility]rology at the [facility] office   Type 2 diabetes mellitus [facility])     Patient Active Problem List   Diagnosis Date Noted   Neuropathy of both feet 02/16/2022   Anxiety disorder 12/16/2020   Asthma without status asthmaticus 12/16/2020   Catarrhal nasal discharge 12/16/2020   Diarrhea 12/16/2020   Diverticular disease of colon 12/16/2020   Dyslipidemia 12/16/2020   Epigastric pain 12/16/2020   Gastroesophageal reflux disease 12/16/2020   Hardening of the aorta (main artery of the heart) (HCC) 12/16/2020   History of lymphoma 12/16/2020   History of malignant neoplasm of skin 12/16/2020   Knee pain 12/16/2020   Lymphoma of intra-abdominal lymph nodes (HCC) 12/16/2020   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   Obesity 12/16/2020   Osteoarthritis 12/16/2020   Spasm 12/16/2020    Vitamin D deficiency 12/16/2020   Lymphoma, small lymphocytic (HCC) 07/24/2019   Dysphonia 07/05/2018   Globus pharyngeus 06/19/2018   Sudden idiopathic hearing loss of left ear 05/31/2018   Temporomandibular joint (TMJ) pain 05/31/2018   Referred otalgia of left ear 05/24/2018   CLL (chronic lymphocytic leukemia) (HCC) 06/28/2016   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   Lymphadenopathy, retroperitoneal 06/03/2016   Malignant melanoma of arm, right (HCC) 01/13/2016   Weakness of back 01/08/2014   Back pain, lumbosacral 01/08/2014   Cholelithiasis with cholecystitis 02/06/2013   Type 2 diabetes mellitus without complications (HCC) 04/02/2010   MULTIPLE SCLEROSIS 04/02/2010   ALLERGIC RHINITIS 04/02/2010   Asthma 04/02/2010   DIVERTICULITIS, HX OF 04/02/2010    Past Surgical History:  Procedure Laterality Date   ABDOMINAL HYSTERECTOMY     with 1 ovary removed-[facility]   CHOLECYSTECTOMY N/A 02/25/2013   Procedure: LAPAROSCOPIC CHOLECYSTECTOMY WITH INTRAOPERATIVE CHOLANGIOGRAM;  Surgeon: Velora Heckler, MD;  Location: [facility];  Service: General;  Laterality: N/A;   COLONOSCOPY  02/19/2013   per Dr. Dorena Cookey, clear, repeat in 10 yrs   IR KYPHO THORACIC WITH BONE BIOPSY  05/07/2021   TONSILLECTOMY      OB History     Gravida  3   Para  2   Term  2   Preterm  AB  1   Living  2      SAB  1   IAB      Ectopic      Multiple      Live Births               Home Medications    Prior to Admission medications   Medication Sig Start Date End Date Taking? Authorizing Provider  acetaminophen (TYLENOL) 500 MG tablet Take 500 mg by mouth as needed.    [provider]  ALPRAZolam Prudy Feeler) 0.5 MG tablet Take 0.5 mg by mouth daily as needed (for pain).     [provider]  baclofen (LIORESAL) 10 MG tablet Take 5 mg by mouth 3 (three) times daily as needed. 04/15/21   [provider]  Cholecalciferol (VITAMIN D) 50 MCG (2000  UT) CAPS Take 2,000 Units by mouth daily.     [provider]  CINNAMON PO Take 2,000 mg by mouth daily. 05/24/18   [provider]  diclofenac Sodium (VOLTAREN) 1 % GEL Apply 1 application. topically 4 (four) times daily as needed (pain).    [provider]  esomeprazole (NEXIUM) 20 MG capsule Take 20 mg by mouth daily.    [provider]  fluticasone (FLONASE) 50 MCG/ACT nasal spray Place 1 spray into both nostrils daily as needed for allergies. Sometimes bid    [provider]  glipiZIDE (GLUCOTROL XL) 2.5 MG 24 hr tablet Take 1 tablet (2.5 mg total) by mouth 2 (two) times daily. 08/14/23   Nelwyn Salisbury, MD  ibrutinib (IMBRUVICA) 420 MG tablet TAKE 1 TABLET BY MOUTH ONCE DAILY.  TABLET SHOULD BE TAKEN WITH A GLASS OF WATER. 07/10/23   Ladene Artist, MD  Lancets Misc. MISC 1 each by Does not apply route as directed. 02/10/22   Nelwyn Salisbury, MD  loratadine (CLARITIN) 10 MG tablet Take 10 mg by mouth daily as needed (allergies).    [provider]  losartan (COZAAR) 25 MG tablet TAKE ONE TABLET (25MG  TOTAL) BY MOUTH DAILY 03/14/23   Nelwyn Salisbury, MD  MAGNESIUM GLUCONATE PO Take 200 mg by mouth 2 (two) times daily.    [provider]  meloxicam (MOBIC) 15 MG tablet TAKE ONE (1) TABLET BY MOUTH EVERY DAY AS NEEDED FOR KNEE PAIN 02/24/23   Nelwyn Salisbury, MD  simvastatin (ZOCOR) 20 MG tablet TAKE ONE TABLET (20MG  TOTAL) BY MOUTH ATBEDTIME 03/27/23   Nelwyn Salisbury, MD  VENTOLIN HFA 108 (90 Base) MCG/ACT inhaler INHALE 2 PUFFS EVERY 4 TO 6 HOURS AS NEEDED. SHAKE WELL BEFORE EACH USE. 01/28/22   Nelwyn Salisbury, MD    Family History Family History  Problem Relation Age of Onset   COPD Mother    Heart disease Mother    Diabetes Father    Heart disease Sister     Social History Social History   Tobacco Use   Smoking status: Former    Current packs/day: 0.00    Types: Cigarettes    Quit date: 01/31/2005    Years since quitting:  18.7   Smokeless tobacco: Never  Vaping Use   Vaping status: Never Used  Substance Use Topics   Alcohol use: No   Drug use: No     Allergies   Amoxicillin-pot clavulanate, Codeine, Fenofibrate, Hydrocodone, Hydrocodone-acetaminophen, Lisinopril, Metformin and related, and Doxycycline   Review of Systems Review of Systems Per HPI  Physical Exam Triage Vital Signs ED Triage  Vitals  Encounter Vitals Group     BP 11/01/23 1247 (!) 150/83     Systolic BP Percentile --      Diastolic BP Percentile --      Pulse Rate 11/01/23 1247 88     Resp 11/01/23 1247 17     Temp 11/01/23 1247 98 F (36.7 C)     Temp Source 11/01/23 1247 Oral     SpO2 11/01/23 1247 96 %     Weight --      Height --      Head Circumference --      Peak Flow --      Pain Score 11/01/23 1252 0     Pain Loc --      Pain Education --      Exclude from Growth Chart --    No data found.  Updated Vital Signs BP (!) 150/83 (BP Location: Right Arm)   Pulse 88   Temp 98 F (36.7 C) (Oral)   Resp 17   SpO2 96%   Visual Acuity Right Eye Distance:   Left Eye Distance:   Bilateral Distance:    Right Eye Near:   Left Eye Near:    Bilateral Near:     Physical Exam Vitals and nursing note reviewed.  Constitutional:      Appearance: Normal appearance. She is not ill-appearing.  HENT:     Head: Atraumatic.  Eyes:     Extraocular Movements: Extraocular movements intact.     Conjunctiva/sclera: Conjunctivae normal.  Cardiovascular:     Rate and Rhythm: Normal rate and regular rhythm.     Heart sounds: Normal heart sounds.  Pulmonary:     Effort: Pulmonary effort is normal.     Breath sounds: Normal breath sounds.  Genitourinary:    Comments: GU exam declined, deferred to PCP Musculoskeletal:        General: Normal range of motion.     Cervical back: Normal range of motion and neck supple.  Skin:    General: Skin is warm and dry.     Findings: Rash present.     Comments: Area of erythema and  maceration to right lower abdomen in skin fold  Neurological:     Mental Status: She is alert and oriented to person, place, and time.  Psychiatric:        Mood and Affect: Mood normal.        Thought Content: Thought content normal.        Judgment: Judgment normal.      UC Treatments / Results  Labs (all labs ordered are listed, but only abnormal results are displayed) Labs Reviewed - No data to display  EKG   Radiology No results found.  Procedures Procedures (including critical care time)  Medications Ordered in UC Medications - No data to display  Initial Impression / Assessment and Plan / UC Course  I have reviewed the triage vital signs and the nursing notes.  Pertinent labs & imaging results that were available during my care of the patient were reviewed by me and considered in my medical decision making (see chart for details).     Continue nystatin cream, Monistat vaginal cream, good blood sugar control and follow-up with PCP for recheck. Final Clinical Impressions(s) / UC Diagnoses   Final diagnoses:  Yeast dermatitis     Discharge Instructions      Continue nystatin cream, Monistat to affected areas, follow-up with primary care for a recheck  ED Prescriptions   None    PDMP not reviewed this encounter.   Particia Nearing, [place] 11/01/23 1614

## 2023-11-01 NOTE — Discharge Instructions (Addendum)
Continue nystatin cream, Monistat to affected areas, follow-up with primary care for a recheck

## 2023-11-01 NOTE — ED Triage Notes (Signed)
Pt reports, itching in her anus, states she had a colonoscopy, and thinks it may be a yeast infection from the propofol, and has tried using Nystatin, monistat. Pt has since developed, yeast in two other area's as well.

## 2023-11-07 ENCOUNTER — Other Ambulatory Visit: Payer: Self-pay

## 2023-11-07 ENCOUNTER — Other Ambulatory Visit (HOSPITAL_COMMUNITY): Payer: Self-pay

## 2023-11-07 ENCOUNTER — Other Ambulatory Visit: Payer: Self-pay | Admitting: Oncology

## 2023-11-07 DIAGNOSIS — C9110 Chronic lymphocytic leukemia of B-cell type not having achieved remission: Secondary | ICD-10-CM

## 2023-11-07 MED ORDER — IBRUTINIB 420 MG PO TABS
ORAL_TABLET | ORAL | 3 refills | Status: DC
Start: 2023-11-07 — End: 2024-02-29
  Filled 2023-11-07: qty 28, 28d supply, fill #0
  Filled 2023-12-07: qty 28, 28d supply, fill #1
  Filled 2024-01-01: qty 28, 28d supply, fill #2
  Filled 2024-02-01: qty 28, 28d supply, fill #3

## 2023-11-07 NOTE — Progress Notes (Signed)
Specialty Pharmacy Refill Coordination Note  Rachel Coffey is a 72 y.o. female contacted today regarding refills of specialty medication(s) Ibrutinib [name]   Patient requested Delivery   Delivery date: 11/20/23   Verified address: [address]  [place] [address]   Medication will be filled on 11/17/23.

## 2023-11-09 ENCOUNTER — Encounter: Payer: Self-pay | Admitting: Oncology

## 2023-11-17 ENCOUNTER — Other Ambulatory Visit: Payer: Self-pay

## 2023-11-17 ENCOUNTER — Other Ambulatory Visit (HOSPITAL_COMMUNITY): Payer: Self-pay

## 2023-11-20 ENCOUNTER — Ambulatory Visit: Payer: Self-pay | Admitting: Family Medicine

## 2023-11-20 NOTE — Telephone Encounter (Signed)
 Pt has appointment schedule on 11/21/23 with Alyson Reedy, FNP

## 2023-11-20 NOTE — Telephone Encounter (Signed)
  Chief Complaint: facial pain Symptoms: cough, sinus pain, bilateral earache, post nasal drip Frequency: since yesterday Pertinent Negatives: Patient denies fever, SOB, chest pain. Disposition: [] ED /[] Urgent Care (no appt availability in office) / [x] Appointment(In office/virtual)/ []  [facility]/ [] Home Care/ [] Refused Recommended Disposition /[] [facility]/ []  Follow-up with PCP Additional Notes: Patient states she is calling about symptoms since yesterday due to she is concerned about her immune system with her medical history of being a cancer patient, diabetic and having MS. Patient requesting to be seen at the end of the week and try taking Zinc and Vitamin C at home. Advised patient to be evaluated by provider within 24 hours. Patient agreeable to office visit tomorrow with available provider at [facility] due to no sooner availability at her PCP.    Copied from CRM [id number]. Topic: Clinical - Pink Word Triage >> Nov 20, 2023  8:51 AM Rosina BIRCH wrote: Reason for Triage: sinus drainage, coughing, face tight and eyes feel like that are about to blow out her face Reason for Disposition  Earache  Answer Assessment - Initial Assessment Questions 1. LOCATION: Where does it hurt?      Under cheek bones, eyes, radiates into ears.  2. ONSET: When did the sinus pain start?  (e.g., hours, days)      Yesterday.  3. SEVERITY: How bad is the pain?   (Scale 1-10; mild, moderate or severe)   - MILD (1-3): doesn't interfere with normal activities    - MODERATE (4-7): interferes with normal activities (e.g., work or school) or awakens from sleep   - SEVERE (8-10): excruciating pain and patient unable to do any normal activities        4/10.  4. RECURRENT SYMPTOM: Have you ever had sinus problems before? If Yes, ask: When was the last time? and What happened that time?      Patient states last time was several years ago and she states when it went green she was  placed on antibiotics.  5. NASAL CONGESTION: Is the nose blocked? If Yes, ask: Can you open it or must you breathe through your mouth?     Patient states she can breathe through her nose, it is not blocked up.  6. NASAL DISCHARGE: Do you have discharge from your nose? If so ask, What color?     Clear discharge.  7. FEVER: Do you have a fever? If Yes, ask: What is it, how was it measured, and when did it start?      Denies.  8. OTHER SYMPTOMS: Do you have any other symptoms? (e.g., sore throat, cough, earache, difficulty breathing)     Nonproductive cough, bilateral earache, post nasal drip, scratchy throat yesterday.  Protocols used: Sinus Pain or Congestion-A-AH

## 2023-11-21 ENCOUNTER — Ambulatory Visit (INDEPENDENT_AMBULATORY_CARE_PROVIDER_SITE_OTHER): Payer: Medicare Other | Admitting: Family Medicine

## 2023-11-21 ENCOUNTER — Encounter (HOSPITAL_BASED_OUTPATIENT_CLINIC_OR_DEPARTMENT_OTHER): Payer: Self-pay | Admitting: Family Medicine

## 2023-11-21 VITALS — BP 150/58 | HR 90 | Temp 99.1°F | Ht 67.0 in | Wt 178.2 lb

## 2023-11-21 DIAGNOSIS — B9689 Other specified bacterial agents as the cause of diseases classified elsewhere: Secondary | ICD-10-CM

## 2023-11-21 DIAGNOSIS — J019 Acute sinusitis, unspecified: Secondary | ICD-10-CM

## 2023-11-21 MED ORDER — BENZONATATE 100 MG PO CAPS: 100.0000 mg | ORAL_CAPSULE | Freq: Three times a day (TID) | ORAL | 0 refills | Status: DC | PRN

## 2023-11-21 MED ORDER — AZITHROMYCIN 250 MG PO TABS
ORAL_TABLET | ORAL | 0 refills | Status: AC
Start: 2023-11-21 — End: 2023-11-26

## 2023-11-21 NOTE — Progress Notes (Signed)
 Acute Office Visit  Subjective:     Patient ID: Rachel Coffey, female    DOB: 28-Apr-1951, 73 y.o.   MRN: [id number]  Chief Complaint  Patient presents with   Cough    Has been dry, until today can hear some congestion in her chest when she coughs it up it is green. Has tried Mucinex, Tylenol  for pain, Tylenol  sinus, tried Xyzal but it dries her out   Sinus Problem    Started on Sunday, pressure in face    Rachel Coffey is a pleasant 73 year old female patient who presents today for an acute visit with complaint of cough, nasal congestion, and sinus pressure.  Symptoms have been present since Saturday. She reports her cough is productive and is green in color. Associated symptoms include: scratchy throat started on Saturday afternoon, postnasal drip, cough- feeling very tight, sinus pressure/pain, chills, body aches, decreased appetite  Denies shortness of breath, wheezing, chest pain.  Treatments tried include: see above   Sick contacts: unknown   ROS: see HPI    Objective:    BP (!) 150/58   Pulse 90   Temp 99.1 F (37.3 C) (Oral)   Ht 5' 7 (1.702 m)   Wt 178 lb 3.2 oz (80.8 kg)   SpO2 96%   BMI 27.91 kg/m   Physical Exam Vitals reviewed.  Constitutional:      Appearance: Normal appearance.  HENT:     Right Ear: Tympanic membrane, ear canal and external ear normal.     Left Ear: Tympanic membrane, ear canal and external ear normal.     Nose: Congestion and rhinorrhea present.     Right Turbinates: Enlarged and swollen.     Left Turbinates: Enlarged and swollen.     Right Sinus: Maxillary sinus tenderness and frontal sinus tenderness present.     Left Sinus: Maxillary sinus tenderness and frontal sinus tenderness present.     Mouth/Throat:     Mouth: Mucous membranes are moist.     Pharynx: Oropharynx is clear. No oropharyngeal exudate or posterior oropharyngeal erythema.  Eyes:     General:        Right eye: No discharge.        Left eye: No discharge.      Conjunctiva/sclera: Conjunctivae normal.     Pupils: Pupils are equal, round, and reactive to light.  Cardiovascular:     Rate and Rhythm: Normal rate and regular rhythm.     Heart sounds: Normal heart sounds.  Pulmonary:     Effort: Pulmonary effort is normal.     Breath sounds: Normal breath sounds.  Lymphadenopathy:     Cervical: Cervical adenopathy present.     Right cervical: Superficial cervical adenopathy present.     Left cervical: Superficial cervical adenopathy present.  Neurological:     Mental Status: She is alert.    Assessment & Plan:   1. Acute bacterial rhinosinusitis (Primary) Rachel Coffey is a pleasant 73 year old female patient who presents today for an acute visit with complaint of cough, nasal congestion, and sinus pressure. Physical exam with TMs intact with no bulging, erythema, or fluid present- clearly visible landmarks. Normal sclera with no injection or chemosis. Frontal and maxillary sinus tenderness with palpation. Nasal congestion present with swollen turbinates. Cardiovascular exam with regular rate and rhythm. No murmurs present. Lungs clear to auscultation in all lung fields. Cervical lymphadenopathy present. Due to being immunocompromised and upcoming travel, reasonable to treat patient for acute bacterial rhinosinusitis. Antibiotic  and cough suppressant sent to pharmacy on file. Advised her to return to office if symptoms worsen or fail to improve.  - azithromycin  (ZITHROMAX ) 250 MG tablet; Take 2 tablets on day 1, then 1 tablet daily on days 2 through 5  Dispense: 6 tablet; Refill: 0 - benzonatate  (TESSALON  PERLES) 100 MG capsule; Take 1 capsule (100 mg total) by mouth 3 (three) times daily as needed for cough.  Dispense: 30 capsule; Refill: 0  Return if symptoms worsen or fail to improve.  Rachel Arts, FNP

## 2023-11-21 NOTE — Patient Instructions (Signed)
 Vitamin Regimen:  Vitamin C 500mg  twice daily  Zinc 50-75mg  once daily   Over the counter Medications:  Aspirin 81mg  per day * unless allergic or contraindicated*  Use Tylenol  (acetaminophen ) for fever *unless allergic or contraindicated*   Non-Medication Therapy:  Drink plenty of fluids, warm if possible.  A teaspoon of honey may help ease coughing symptoms.  Cough drops or hard candy for coughing.   Over the Counter Medication Therapy:  Use a cough expectorant such as guaifenesin (Mucinex) if recommended by your doctor for a wet, congested cough. If you have high blood pressure, please ask your doctor first before using this.  Use a cough suppressant such as dextromethorphan (Robitussin/Delsym) for a dry cough. If you have high blood pressure, please ask your doctor first before using this.  If you have high blood pressure, medication such as Coricidin HBP is safe to take for your cough and will not increase your blood pressure.

## 2023-11-23 ENCOUNTER — Encounter (HOSPITAL_BASED_OUTPATIENT_CLINIC_OR_DEPARTMENT_OTHER): Payer: Self-pay

## 2023-11-23 ENCOUNTER — Ambulatory Visit (INDEPENDENT_AMBULATORY_CARE_PROVIDER_SITE_OTHER): Payer: Medicare Other | Admitting: Family Medicine

## 2023-11-23 ENCOUNTER — Encounter: Payer: Self-pay | Admitting: Family Medicine

## 2023-11-23 ENCOUNTER — Ambulatory Visit (HOSPITAL_BASED_OUTPATIENT_CLINIC_OR_DEPARTMENT_OTHER): Payer: Medicare Other | Admitting: Family Medicine

## 2023-11-23 ENCOUNTER — Ambulatory Visit: Payer: Self-pay | Admitting: Family Medicine

## 2023-11-23 VITALS — BP 110/60 | HR 75 | Temp 97.8°F | Ht 67.0 in | Wt 178.2 lb

## 2023-11-23 DIAGNOSIS — U071 COVID-19: Secondary | ICD-10-CM | POA: Diagnosis not present

## 2023-11-23 DIAGNOSIS — J020 Streptococcal pharyngitis: Secondary | ICD-10-CM

## 2023-11-23 DIAGNOSIS — J029 Acute pharyngitis, unspecified: Secondary | ICD-10-CM | POA: Diagnosis not present

## 2023-11-23 LAB — POC COVID19 BINAXNOW: SARS Coronavirus 2 Ag: POSITIVE — AB

## 2023-11-23 LAB — POCT RAPID STREP A (OFFICE): Rapid Strep A Screen: POSITIVE — AB

## 2023-11-23 MED ORDER — PROMETHAZINE-DM 6.25-15 MG/5ML PO SYRP: 5.0000 mL | ORAL_SOLUTION | Freq: Four times a day (QID) | ORAL | 0 refills | Status: DC | PRN

## 2023-11-23 MED ORDER — CEFDINIR 300 MG PO CAPS
300.0000 mg | ORAL_CAPSULE | Freq: Two times a day (BID) | ORAL | 0 refills | Status: AC
Start: 2023-11-23 — End: 2023-11-30

## 2023-11-23 NOTE — Telephone Encounter (Signed)
Pt was seen at the office today by Dr Legrand Como

## 2023-11-23 NOTE — Progress Notes (Signed)
 Acute Office Visit  Subjective:     Patient ID: Rachel Coffey, female    DOB: [birth year], 73 y.o.   MRN: [id number]  Chief Complaint  Patient presents with   Sinus Problem    Patient complains of sinus congestion, headache and facial pain x5 days, states she was seen at [facility] office 2 days ago, given Z-pac and states she does not feel better   Sore Throat    X4 days, tried salt water gargle and nasal spray    HPI Patient is in today for acute URI symptoms. Reports her illness duration is 6 days, was seen 2 days ago at [facility] and was given a z-pak. Has taken the first 2 days of it however she is not feeling any better. She reports that she is immunocompromised and she did go out without her mask last week. Fevers and chills are low grade at home. There is significant facial pressure that she is reporting. There is a history of CLL and asthma, remote smoking history, she took the benzonatate  but they have not improved her.   Review of Systems  All other systems reviewed and are negative.       Objective:    BP 110/60   Pulse 75   Temp 97.8 F (36.6 C) (Oral)   Ht 5' 7 (1.702 m)   Wt 178 lb 3.2 oz (80.8 kg)   SpO2 97%   BMI 27.91 kg/m    Physical Exam Vitals reviewed.  Constitutional:      Appearance: She is well-developed and normal weight.  HENT:     Right Ear: Tympanic membrane normal.     Left Ear: Tympanic membrane normal.     Mouth/Throat:     Mouth: Mucous membranes are moist.     Pharynx: Oropharyngeal exudate present.  Cardiovascular:     Rate and Rhythm: Normal rate and regular rhythm.     Heart sounds: Normal heart sounds.  Pulmonary:     Effort: Pulmonary effort is normal.     Breath sounds: Normal breath sounds. No wheezing.  Neurological:     Mental Status: She is alert and oriented to person, place, and time.     Results for orders placed or performed in visit on 11/23/23  POC COVID-19  Result Value Ref Range   SARS Coronavirus 2  Ag Positive (A) Negative  POC Rapid Strep A  Result Value Ref Range   Rapid Strep A Screen Positive (A) Negative        Assessment & Plan:   Problem List Items Addressed This Visit   None Visit Diagnoses       Sore throat    -  Primary   Relevant Orders   POC COVID-19 (Completed)   POC Rapid Strep A (Completed)     COVID-19       Relevant Medications   cefdinir  (OMNICEF ) 300 MG capsule   promethazine -dextromethorphan (PROMETHAZINE -DM) 6.25-15 MG/5ML syrup     Strep pharyngitis       Relevant Medications   cefdinir  (OMNICEF ) 300 MG capsule      Positive for both strep and COVID. Unfortunately she is outside the window for Paxlovid therapy. However, will add cefdinir  300 mg BID to improve the coverage for the strep pharyngitis. No wheezing on exam, will give PRN cough syrup to help her at night. Recommended continuing OTC medications for symptom relief.  Meds ordered this encounter  Medications   cefdinir  (OMNICEF ) 300 MG capsule  Sig: Take 1 capsule (300 mg total) by mouth 2 (two) times daily for 7 days.    Dispense:  14 capsule    Refill:  0   promethazine -dextromethorphan (PROMETHAZINE -DM) 6.25-15 MG/5ML syrup    Sig: Take 5 mLs by mouth 4 (four) times daily as needed for cough.    Dispense:  180 mL    Refill:  0    Return if symptoms worsen or fail to improve.  Heron CHRISTELLA Sharper, MD

## 2023-11-23 NOTE — Telephone Encounter (Signed)
 Copied from CRM [id number]. Topic: Clinical - Pink Word Triage >> Nov 23, 2023  8:11 AM Isabell A wrote: Reason for Triage: Sinus drainage, coughing, face tight, sore throat - patient states symptoms are getting worse  Chief Complaint: facial pain and sinues Symptoms: nasal congestion, facial pain, teeth hurt, losing voice and states it is going into her chest Frequency: constant; started last Saturday and was on z-pac. Pertinent Negatives: Patient denies sob, fever at this time Disposition: [] ED /[] Urgent Care (no appt availability in office) / [x] Appointment(In office/virtual)/ []  [place] [facility]/ [] Home Care/ [] Refused Recommended Disposition /[]  Mobile Bus/ []  Follow-up with PCP Additional Notes: states was seen last week and dx with sinusitis.  Zpac finished and patient states she is not feeling any better; feels like it is going into her chest.  Apt. Made for this afternoon.  Instructed to go to er if becomes worse.  Care advice given; denies questions.   Reason for Disposition  [1] MODERATE pain (e.g., interferes with normal activities) AND [2] constant AND [3] present > 24 hours  Answer Assessment - Initial Assessment Questions 1. ONSET: When did the pain start? (e.g., minutes, hours, days)     Last Saturday 2. ONSET: Does the pain come and go, or has it been constant since it started? (e.g., constant, intermittent, fleeting)     Steady 3. SEVERITY: How bad is the pain?   (Scale 1-10; mild, moderate or severe)   - MILD (1-3): doesn't interfere with normal activities    - MODERATE (4-7): interferes with normal activities or awakens from sleep    - SEVERE (8-10): excruciating pain, unable to do any normal activities      4/10 4. LOCATION: Where does it hurt?      All over face and sinuses 5. RASH: Is there any redness, rash, or swelling of the face?     denies 6. FEVER: Do you have a fever? If Yes, ask: What is it, how was it measured, and when did it  start?      Low grade fever 7. OTHER SYMPTOMS: Do you have any other symptoms? (e.g., fever, toothache, nasal discharge, nasal congestion, clicking sensation in jaw joint)     Nasal congestion and teeth ache  Protocols used: Face Pain-A-AH

## 2023-12-05 ENCOUNTER — Other Ambulatory Visit (HOSPITAL_COMMUNITY): Payer: Self-pay

## 2023-12-07 ENCOUNTER — Other Ambulatory Visit (HOSPITAL_COMMUNITY): Payer: Self-pay | Admitting: Pharmacy Technician

## 2023-12-07 ENCOUNTER — Other Ambulatory Visit (HOSPITAL_COMMUNITY): Payer: Self-pay

## 2023-12-07 NOTE — Progress Notes (Signed)
Specialty Pharmacy Refill Coordination Note  Rachel Coffey is a 73 y.o. female contacted today regarding refills of specialty medication(s) Ibrutinib [name]   Patient requested Delivery   Delivery date: 12/13/23   Verified address: Patient address [address]  [place] [address]   Medication will be filled on 12/12/23.

## 2023-12-12 ENCOUNTER — Other Ambulatory Visit: Payer: Self-pay

## 2023-12-12 DIAGNOSIS — Z1231 Encounter for screening mammogram for malignant neoplasm of breast: Secondary | ICD-10-CM | POA: Diagnosis not present

## 2023-12-12 LAB — HM MAMMOGRAPHY

## 2023-12-13 ENCOUNTER — Encounter: Payer: Self-pay | Admitting: Family Medicine

## 2024-01-01 ENCOUNTER — Other Ambulatory Visit (HOSPITAL_COMMUNITY): Payer: Self-pay

## 2024-01-01 NOTE — Progress Notes (Signed)
 Specialty Pharmacy Refill Coordination Note  Rachel Coffey is a 73 y.o. female contacted today regarding refills of specialty medication(s) Ibrutinib [name]   Patient requested Delivery   Delivery date: 01/10/24   Verified address: [address]  [place] [address]   Medication will be filled on 01/09/24.

## 2024-01-01 NOTE — Progress Notes (Signed)
 Specialty Pharmacy Ongoing Clinical Assessment Note  Rachel Coffey is a 73 y.o. female who is being followed by the specialty pharmacy service for RxSp Oncology   Patient's specialty medication(s) reviewed today: Ibrutinib (IMBRUVICA)   Missed doses in the last 4 weeks: 0   Patient/Caregiver did not have any additional questions or concerns.   Therapeutic benefit summary: Patient is achieving benefit   Adverse events/side effects summary: Experienced adverse events/side effects (sensitive skin, tolerable)   Patient's therapy is appropriate to: Continue    Goals Addressed             This Visit's Progress    Achieve or maintain remission       Patient is on track. Patient will maintain adherence. Patient is in clinical remission from CLL.          Follow up:  6 months  Servando Snare Specialty Pharmacist

## 2024-01-10 ENCOUNTER — Encounter: Payer: Self-pay | Admitting: Family Medicine

## 2024-01-12 DIAGNOSIS — Z808 Family history of malignant neoplasm of other organs or systems: Secondary | ICD-10-CM | POA: Diagnosis not present

## 2024-01-12 DIAGNOSIS — L720 Epidermal cyst: Secondary | ICD-10-CM | POA: Diagnosis not present

## 2024-01-12 DIAGNOSIS — Z85820 Personal history of malignant melanoma of skin: Secondary | ICD-10-CM | POA: Diagnosis not present

## 2024-01-12 DIAGNOSIS — D2271 Melanocytic nevi of right lower limb, including hip: Secondary | ICD-10-CM | POA: Diagnosis not present

## 2024-01-12 DIAGNOSIS — L578 Other skin changes due to chronic exposure to nonionizing radiation: Secondary | ICD-10-CM | POA: Diagnosis not present

## 2024-01-12 DIAGNOSIS — Z85828 Personal history of other malignant neoplasm of skin: Secondary | ICD-10-CM | POA: Diagnosis not present

## 2024-01-12 DIAGNOSIS — D225 Melanocytic nevi of trunk: Secondary | ICD-10-CM | POA: Diagnosis not present

## 2024-01-12 DIAGNOSIS — L821 Other seborrheic keratosis: Secondary | ICD-10-CM | POA: Diagnosis not present

## 2024-01-12 MED ORDER — GLIPIZIDE ER 5 MG PO TB24: 5.0000 mg | ORAL_TABLET | Freq: Two times a day (BID) | ORAL | 5 refills | Status: DC

## 2024-01-12 NOTE — Telephone Encounter (Signed)
 I think that is a good idea. I have stopped the XL 2.5 mg and I have sent in the XL 5 mg dose twice daily

## 2024-01-16 ENCOUNTER — Other Ambulatory Visit (INDEPENDENT_AMBULATORY_CARE_PROVIDER_SITE_OTHER): Payer: Medicare Other

## 2024-01-16 DIAGNOSIS — E119 Type 2 diabetes mellitus without complications: Secondary | ICD-10-CM | POA: Diagnosis not present

## 2024-01-16 LAB — HEMOGLOBIN A1C: Hgb A1c MFr Bld: 6.6 % — ABNORMAL HIGH (ref 4.6–6.5)

## 2024-01-17 ENCOUNTER — Encounter: Payer: Self-pay | Admitting: *Deleted

## 2024-01-19 ENCOUNTER — Other Ambulatory Visit: Payer: Medicare Other

## 2024-01-19 ENCOUNTER — Ambulatory Visit: Payer: Medicare Other | Admitting: Oncology

## 2024-01-19 ENCOUNTER — Other Ambulatory Visit: Payer: Self-pay

## 2024-01-22 ENCOUNTER — Other Ambulatory Visit: Payer: Self-pay | Admitting: Family Medicine

## 2024-01-23 ENCOUNTER — Inpatient Hospital Stay (HOSPITAL_BASED_OUTPATIENT_CLINIC_OR_DEPARTMENT_OTHER): Payer: Medicare Other | Admitting: Oncology

## 2024-01-23 ENCOUNTER — Inpatient Hospital Stay: Payer: Medicare Other | Attending: Oncology

## 2024-01-23 VITALS — BP 125/61 | HR 85 | Temp 98.1°F | Resp 18 | Ht 67.0 in | Wt 182.3 lb

## 2024-01-23 DIAGNOSIS — G35 Multiple sclerosis: Secondary | ICD-10-CM | POA: Insufficient documentation

## 2024-01-23 DIAGNOSIS — C9110 Chronic lymphocytic leukemia of B-cell type not having achieved remission: Secondary | ICD-10-CM | POA: Diagnosis not present

## 2024-01-23 DIAGNOSIS — C9111 Chronic lymphocytic leukemia of B-cell type in remission: Secondary | ICD-10-CM | POA: Insufficient documentation

## 2024-01-23 DIAGNOSIS — E119 Type 2 diabetes mellitus without complications: Secondary | ICD-10-CM | POA: Insufficient documentation

## 2024-01-23 DIAGNOSIS — Z85820 Personal history of malignant melanoma of skin: Secondary | ICD-10-CM | POA: Diagnosis not present

## 2024-01-23 DIAGNOSIS — J449 Chronic obstructive pulmonary disease, unspecified: Secondary | ICD-10-CM | POA: Diagnosis not present

## 2024-01-23 LAB — CBC WITH DIFFERENTIAL (CANCER CENTER ONLY)
Abs Immature Granulocytes: 0.02 10*3/uL (ref 0.00–0.07)
Basophils Absolute: 0.1 10*3/uL (ref 0.0–0.1)
Basophils Relative: 1 %
Eosinophils Absolute: 0.1 10*3/uL (ref 0.0–0.5)
Eosinophils Relative: 1 %
HCT: 42.7 % (ref 36.0–46.0)
Hemoglobin: 13.8 g/dL (ref 12.0–15.0)
Immature Granulocytes: 0 %
Lymphocytes Relative: 19 %
Lymphs Abs: 1.6 10*3/uL (ref 0.7–4.0)
MCH: 29.2 pg (ref 26.0–34.0)
MCHC: 32.3 g/dL (ref 30.0–36.0)
MCV: 90.5 fL (ref 80.0–100.0)
Monocytes Absolute: 0.6 10*3/uL (ref 0.1–1.0)
Monocytes Relative: 7 %
Neutro Abs: 5.8 10*3/uL (ref 1.7–7.7)
Neutrophils Relative %: 72 %
Platelet Count: 153 10*3/uL (ref 150–400)
RBC: 4.72 MIL/uL (ref 3.87–5.11)
RDW: 13.1 % (ref 11.5–15.5)
WBC Count: 8.2 10*3/uL (ref 4.0–10.5)
nRBC: 0 % (ref 0.0–0.2)

## 2024-01-23 LAB — CMP (CANCER CENTER ONLY)
ALT: 13 U/L (ref 0–44)
AST: 15 U/L (ref 15–41)
Albumin: 4.4 g/dL (ref 3.5–5.0)
Alkaline Phosphatase: 56 U/L (ref 38–126)
Anion gap: 8 (ref 5–15)
BUN: 21 mg/dL (ref 8–23)
CO2: 32 mmol/L (ref 22–32)
Calcium: 9.4 mg/dL (ref 8.9–10.3)
Chloride: 102 mmol/L (ref 98–111)
Creatinine: 0.9 mg/dL (ref 0.44–1.00)
GFR, Estimated: >60 mL/min (ref >60)
Glucose, Bld: 96 mg/dL (ref 70–99)
Potassium: 4.4 mmol/L (ref 3.5–5.1)
Sodium: 142 mmol/L (ref 135–145)
Total Bilirubin: 0.6 mg/dL (ref 0.0–1.2)
Total Protein: 6.3 g/dL — ABNORMAL LOW (ref 6.5–8.1)

## 2024-01-23 NOTE — Progress Notes (Signed)
 [facility] OFFICE PROGRESS NOTE   Diagnosis: CLL  INTERVAL HISTORY:   Rachel Coffey returns as scheduled.  She continues ibrutinib.  She has chronic arthralgias that she does not attribute to ibrutinib.  She bruises easily.  No other bleeding.  No fever or night sweats.  She was diagnosed with COVID-19 in early January after presenting with a sore throat and fever.  She complains of discomfort in the left groin for the past few months.  This occurred after she began after she participated in an exercise program.  The discomfort is slightly improved.  The pain is not associated with weightbearing.  Objective:  Vital signs in last 24 hours:  Blood pressure 125/61, pulse 85, temperature 98.1 F (36.7 C), temperature source Temporal, resp. rate 18, height 5\' 7"  (1.702 m), weight 182 lb 4.8 oz (82.7 kg), SpO2 97%.    HEENT: No thrush, 1 mm ulcer at the left buccal mucosa Lymphatics: No cervical, supraclavicular, axillary, or inguinal nodes Resp: Lungs scattered coarse end inspiratory rhonchi, no respiratory distress Cardio: Regular rate and rhythm GI: No hepatosplenomegaly, nontender, no mass Vascular: Trace edema at the left ankle, no leg edema or erythema Musculoskeletal: No pain with motion of the left hip.  Tender at the medial left groin adjacent to the lateral pubis.  No discrete mass. Skin: Small ecchymosis at the dorsum of the left hand   Lab Results:  Lab Results  Component Value Date   WBC 8.2 01/23/2024   HGB 13.8 01/23/2024   HCT 42.7 01/23/2024   MCV 90.5 01/23/2024   PLT 153 01/23/2024   NEUTROABS 5.8 01/23/2024    CMP  Lab Results  Component Value Date   NA 142 01/23/2024   K 4.4 01/23/2024   CL 102 01/23/2024   CO2 32 01/23/2024   GLUCOSE 96 01/23/2024   BUN 21 01/23/2024   CREATININE 0.90 01/23/2024   CALCIUM 9.4 01/23/2024   PROT 6.3 (L) 01/23/2024   ALBUMIN 4.4 01/23/2024   AST 15 01/23/2024   ALT 13 01/23/2024   ALKPHOS 56  01/23/2024   BILITOT 0.6 01/23/2024   GFRNONAA >60 01/23/2024   GFRAA >60 06/22/2020    Medications: I have reviewed the patient's current medications.   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 06/20/2016 Monoclonal Kappa restricted B-cell population identified on flow cytometry Staging PET scan 06/13/2016 confirmed hypermetabolic abdominal/pelvic lymphadenopathy FISH panel positive for 11q-,-12,13q-, and 17p-(10% of cells) Peripheral blood flow cytometry 09/22/2016 at [facility]-CD5/CD23 positive For restricted B-cell population, 2% of lymphocytes Peripheral blood IGH mutation analysis at [facility] 09/22/2016-unmutated CT 06/06/2017-generally stable diffuse lymphadenopathy, node in the pelvis is smaller, some the lymph nodes are larger CTs 07/02/2018- slight decrease in size of retrocrural, retroperitoneal, and mesenteric adenopathy, new 6 mm left upper lobe nodule CT chest 01/01/2019- increase in size of left upper lobe lung nodule-inflammatory appearance, other stable lung nodules, right retrocrural and upper abdominal adenopathy has progressed CTs 07/04/2019- progressive lower thoracic and abdominal adenopathy with dominant mass-effect on the stomach and pancreas, new heterogeneity in the spleen- possible splenic lesion, stable lung nodules Ibrutinib starting 08/14/2019 CT abdomen/pelvis 11/19/2019-decreased size of dominant abdominal mass and decreased abdominal lymphadenopathy CTs 06/22/2020-2 upper abdominal lymph nodes have decreased in size, no evidence of disease progression, unchanged bilateral lung nodules   Stage IB (T1b,N0) melanoma of the right forearm, status post a wide excision and sentinel lymph node biopsy at [facility] March 2017     3.  Multiple sclerosis   4.    Diabetes   5.   COPD  6.  T12 compression fracture after a fall May 22, T12 kyphoplasty 05/07/2021    Disposition: Rachel Coffey remains in clinical remission  from CLL.  She continues to tolerate the ibrutinib well.  She will continue ibrutinib.  She will return for an office visit in 6 months.  The etiology of the left groin discomfort is unclear.  This is most likely related to a benign musculoskeletal condition.  She will call if the pain does not improve.  Thornton Papas, MD  01/23/2024  11:27 AM

## 2024-01-30 ENCOUNTER — Ambulatory Visit: Payer: Self-pay

## 2024-02-01 ENCOUNTER — Other Ambulatory Visit: Payer: Self-pay

## 2024-02-01 NOTE — Progress Notes (Signed)
 Specialty Pharmacy Refill Coordination Note  Rachel Coffey is a 73 y.o. female contacted today regarding refills of specialty medication(s) Ibrutinib (IMBRUVICA)   Patient requested (Patient-Rptd) Delivery   Delivery date: (Patient-Rptd) 02/09/24   Verified address: (Patient-Rptd) [address] [place] [address]   Medication will be filled on 03.27.25.

## 2024-02-11 ENCOUNTER — Encounter: Payer: Self-pay | Admitting: Family Medicine

## 2024-02-12 ENCOUNTER — Other Ambulatory Visit: Payer: Self-pay | Admitting: Family Medicine

## 2024-02-12 ENCOUNTER — Ambulatory Visit: Payer: Self-pay

## 2024-02-12 MED ORDER — GLIPIZIDE ER 5 MG PO TB24: 5.0000 mg | ORAL_TABLET | Freq: Two times a day (BID) | ORAL | 0 refills | Status: DC

## 2024-02-12 NOTE — Telephone Encounter (Signed)
 Copied from CRM [id number]. Topic: Clinical - Medical Advice >> Feb 12, 2024  7:56 AM Alcus Dad wrote: Reason for CRM: Patient has anal itching. Has gone to urgent care. Patient was on yeast cream but it isn't working. Extreme itching and has pain when she urinate  Chief Complaint: itching around anal area Symptoms: itching and pain Frequency: on and off for two weeks Pertinent Negatives: Patient denies fever Disposition: [] ED /[] Urgent Care (no appt availability in office) / [x] Appointment(In office/virtual)/ []  [place] [facility]/ [] Home Care/ [] Refused Recommended Disposition /[] [place] [facility]/ []  Follow-up with PCP Additional Notes: was seen in urgent care and was prescribed a cream which patient states is not working;  care advice given, denies questions; instructed to go to ER if becomes worse.  Reason for Disposition  [1] Localized rash is very painful AND [2] no fever  Answer Assessment - Initial Assessment Questions 1. APPEARANCE of RASH: "Describe the rash."      Area around anus, itching and burns when she urinates 2. LOCATION: "Where is the rash located?"      Around the anus 3. NUMBER: "How many spots are there?"      Unable to see 4. SIZE: "How big are the spots?" (Inches, centimeters or compare to size of a coin)      Unable to see 5. ONSET: "When did the rash start?"      Off and on for two months 6. ITCHING: "Does the rash itch?" If Yes, ask: "How bad is the itch?"  (Scale 0-10; or none, mild, moderate, severe)     4-5/10 7. PAIN: "Does the rash hurt?" If Yes, ask: "How bad is the pain?"  (Scale 0-10; or none, mild, moderate, severe)    - NONE (0): no pain    - MILD (1-3): doesn't interfere with normal activities     - MODERATE (4-7): interferes with normal activities or awakens from sleep     - SEVERE (8-10): excruciating pain, unable to do any normal activities     3/10 8. OTHER SYMPTOMS: "Do you have any other symptoms?" (e.g., fever)     denies 9.  PREGNANCY: "Is there any chance you are pregnant?" "When was your last menstrual period?"     na  Protocols used: Rash or Redness - Localized-A-AH

## 2024-02-13 ENCOUNTER — Encounter: Payer: Self-pay | Admitting: Family Medicine

## 2024-02-13 ENCOUNTER — Ambulatory Visit (INDEPENDENT_AMBULATORY_CARE_PROVIDER_SITE_OTHER): Admitting: Family Medicine

## 2024-02-13 VITALS — BP 128/68 | HR 88 | Temp 98.5°F | Resp 16 | Ht 67.0 in | Wt 181.6 lb

## 2024-02-13 DIAGNOSIS — L03032 Cellulitis of left toe: Secondary | ICD-10-CM | POA: Diagnosis not present

## 2024-02-13 DIAGNOSIS — L309 Dermatitis, unspecified: Secondary | ICD-10-CM

## 2024-02-13 MED ORDER — NYSTATIN-TRIAMCINOLONE 100000-0.1 UNIT/GM-% EX CREA: 1.0000 | TOPICAL_CREAM | Freq: Two times a day (BID) | CUTANEOUS | 0 refills | Status: AC

## 2024-02-13 MED ORDER — SULFAMETHOXAZOLE-TRIMETHOPRIM 800-160 MG PO TABS: 1.0000 | ORAL_TABLET | Freq: Two times a day (BID) | ORAL | 0 refills | Status: AC

## 2024-02-13 NOTE — Progress Notes (Signed)
 ACUTE VISIT Chief Complaint  Patient presents with   Anal Itching   Toe Pain   HPI: Rachel Coffey is a 73 y.o. female with a PMHx significant for asthma, GERD, cholelithiasis, DM II, OA, MS, lymphoma, chronic lymphocytic leukemia, malignant melanoma, anxiety, and diverticulitis, among some, who is here today with above complaints.  Anal Itching:  Patient complains of intermittent anal itching since having her last colonoscopy in 08/2023. She mentions she has a hx of developing "skin yeast" whenever undergoing anesthesia.  She also notes she has occasional diarrhea from one of her CLL medications.   She had went to UC in 11/2023 and was told it was a yeast. Since then, she has been taking monistat, which seems to help.  She has a dermatologist (Dr. Emily Filbert) but has not seen her for this problem yet.  Denies any abdominal pain, rectal pain, or rectal bleeding.   DM II, she is on Glipizide. Lab Results  Component Value Date   HGBA1C 6.6 (H) 01/16/2024   Toe pain:  Patient also complains of intermittent toe pain on the second toe of her left foot for about a year. She had dropped a picture frame on her foot at that time.   She follows with podiatrist, Dx'ed with hammer toe, she has had injections (second toe left foot). She has had intermittent oozing and tenderness around toenail for about a week now. Negative for fever, chills, or decreased appetite  Review of Systems  Constitutional:  Negative for activity change, appetite change and unexpected weight change.  HENT:  Negative for mouth sores, nosebleeds and sore throat.   Respiratory:  Negative for cough and shortness of breath.   Cardiovascular:  Negative for leg swelling.  Gastrointestinal:  Negative for abdominal pain, nausea and vomiting.  Genitourinary:  Negative for decreased urine volume, dysuria and hematuria.  Skin:  Negative for rash.  Neurological:  Negative for syncope and weakness.  See other pertinent  positives and negatives in HPI.  Current Outpatient Medications on File Prior to Visit  Medication Sig Dispense Refill   acetaminophen (TYLENOL) 650 MG CR tablet Take 650 mg by mouth daily.     ALPRAZolam (XANAX) 0.5 MG tablet Take 0.5 mg by mouth daily as needed (for pain).      baclofen (LIORESAL) 20 MG tablet Take 20 mg by mouth 2 (two) times daily.     Cholecalciferol (VITAMIN D) 50 MCG (2000 UT) CAPS Take 2,000 Units by mouth daily.      CINNAMON PO Take 2,000 mg by mouth daily.     diclofenac Sodium (VOLTAREN) 1 % GEL Apply 1 application. topically 4 (four) times daily as needed (pain).     esomeprazole (NEXIUM) 20 MG capsule Take 20 mg by mouth daily.     fluticasone (FLONASE) 50 MCG/ACT nasal spray Place 1 spray into both nostrils daily as needed for allergies. Sometimes bid     glipiZIDE (GLIPIZIDE XL) 5 MG 24 hr tablet Take 1 tablet (5 mg total) by mouth in the morning and at bedtime. 180 tablet 0   glucose blood (ONETOUCH VERIO) test strip TEST ONCE DAILY 100 strip 0   ibrutinib (IMBRUVICA) 420 MG tablet TAKE 1 TABLET BY MOUTH ONCE DAILY.  TABLET SHOULD BE TAKEN WITH A GLASS OF WATER. 28 tablet 3   Lancets Misc. MISC 1 each by Does not apply route as directed. 100 each 0   loratadine (CLARITIN) 10 MG tablet Take 10 mg by mouth daily as needed (  allergies).     losartan (COZAAR) 25 MG tablet TAKE ONE TABLET (25MG  TOTAL) BY MOUTH DAILY 90 tablet 3   MAGNESIUM GLYCINATE PO Take 2 tablets by mouth daily. Strength 265 mg-for muscle spasms     meloxicam (MOBIC) 15 MG tablet TAKE ONE (1) TABLET BY MOUTH EVERY DAY AS NEEDED FOR KNEE PAIN 30 tablet 0   simvastatin (ZOCOR) 20 MG tablet TAKE ONE TABLET (20MG  TOTAL) BY MOUTH ATBEDTIME 90 tablet 3   VENTOLIN HFA 108 (90 Base) MCG/ACT inhaler INHALE 2 PUFFS EVERY 4 TO 6 HOURS AS NEEDED. SHAKE WELL BEFORE EACH USE. 18 g 5   Lancets (ONETOUCH DELICA PLUS LANCET33G) MISC TEST ONCE DAILY 100 each 1   No current facility-administered medications on  file prior to visit.    Past Medical History:  Diagnosis Date   Asthma    stress induced with allergies   Cancer (HCC)    CLL (chronic lymphocytic leukemia) (HCC)    sees Dr. Truett Perna   Compression fracture of thoracic vertebra (HCC)    at T11 and T12, sees Dr. Donalee Citrin   Genital warts    Hyperlipidemia    MS (multiple sclerosis) (HCC)    sees Dr. Anselm Pancoast of [facility] at the [facility] office   Type 2 diabetes mellitus (HCC)    Allergies  Allergen Reactions   Amoxicillin-Pot Clavulanate Other (See Comments)    Pt preferred - causes upset stomach    Codeine Itching   Fenofibrate     aching joints   Hydrocodone Itching   Hydrocodone-Acetaminophen Itching   Lisinopril Cough   Metformin And Related     Makes pt deathly ill   Doxycycline Rash    Social History   Socioeconomic History   Marital status: Married    Spouse name: Not on file   Number of children: Not on file   Years of education: Not on file   Highest education level: Some college, no degree  Occupational History   Not on file  Tobacco Use   Smoking status: Former    Current packs/day: 0.00    Types: Cigarettes    Quit date: 01/31/2005    Years since quitting: 19.0   Smokeless tobacco: Never  Vaping Use   Vaping status: Never Used  Substance and Sexual Activity   Alcohol use: No   Drug use: No   Sexual activity: Never    Partners: Male    Comment: TV[facility]--still has 1 ovary  Other Topics Concern   Not on file  Social History Narrative   Not on file   Social Drivers of Health   Financial Resource Strain: Patient Declined (11/23/2023)   Overall Financial Resource Strain (CARDIA)    Difficulty of Paying Living Expenses: Patient declined  Food Insecurity: Patient Declined (11/23/2023)   Hunger Vital Sign    Worried About Running Out of Food in the Last Year: Patient declined    Ran Out of Food in the Last Year: Patient declined  Transportation Needs: No Transportation Needs (08/10/2023)    PRAPARE - Administrator, Civil Service (Medical): No    Lack of Transportation (Non-Medical): No  Physical Activity: Unknown (11/23/2023)   Exercise Vital Sign    Days of Exercise per Week: Patient declined    Minutes of Exercise per Session: 40 min  Stress: Patient Declined (11/23/2023)   [employer] of Occupational Health - Occupational Stress Questionnaire    Feeling of Stress : Patient declined  Social Connections:  Socially Integrated (11/23/2023)   Social Connection and Isolation Panel [NHANES]    Frequency of Communication with Friends and Family: More than three times a week    Frequency of Social Gatherings with Friends and Family: Once a week    Attends Religious Services: More than 4 times per year    Active Member of Clubs or Organizations: Patient declined    Attends Engineer, structural: More than 4 times per year    Marital Status: Married   Vitals:   02/13/24 1106  BP: 128/68  Pulse: 88  Resp: 16  Temp: 98.5 F (36.9 C)  SpO2: 95%   Body mass index is 28.44 kg/m.  Physical Exam Vitals and nursing note reviewed. Exam conducted with a chaperone present.  Constitutional:      General: She is not in acute distress.    Appearance: She is well-developed.  HENT:     Head: Normocephalic and atraumatic.  Eyes:     Conjunctiva/sclera: Conjunctivae normal.  Cardiovascular:     Rate and Rhythm: Normal rate and regular rhythm.     Heart sounds: No murmur heard. Pulmonary:     Effort: Pulmonary effort is normal. No respiratory distress.     Breath sounds: Normal breath sounds.  Abdominal:     Palpations: Abdomen is soft. There is no mass.     Tenderness: There is no abdominal tenderness.  Genitourinary:    Comments: Perianal erythema, small fissures from scratching. NO edema or edema. Hemorrhoids palpated in rectal canal. Skin:    General: Skin is warm.     Findings: No rash.     Comments: Left 2nd toe with periungual erythema and  tenderness. Small pustular lesion. Dystrophic toenail.  Neurological:     Mental Status: She is alert and oriented to person, place, and time.     Gait: Gait normal.  Psychiatric:        Mood and Affect: Mood and affect normal.   ASSESSMENT AND PLAN:  Ms. Markoff was seen today for anal itching.   Paronychia of second toe of left foot Continue keeping area clean with soap and water, soak foot in vinegar with water, and pull cuticle away from toenail. Bactrim DS bid x 5d, side effects discussed, including aggravating diarrhea. Continue following with podiatrist.  -     Sulfamethoxazole-Trimethoprim; Take 1 tablet by mouth 2 (two) times daily for 5 days.  Dispense: 10 tablet; Refill: 0  Perianal dermatitis We discussed possible etiologies, problem has been going on for a few months. Recommend applying a small amount of Nystatin-Triamcinolone bid for up to 14 days and then continue with Hydrocortisone 1% OTC small amount daily as needed for pruritus. Desitin at night and continue good hygiene. F/U with PCP in 4 weeks, before if needed.  -     Nystatin-Triamcinolone; Apply 1 Application topically 2 (two) times daily for 14 days.  Dispense: 28 g; Refill: 0  Return in about 4 weeks (around 03/12/2024) for pcp.  I, Rolla Etienne Wierda, acting as a scribe for Olamae Ferrara [place], MD., have documented all relevant documentation on the behalf of Viraj Liby [place], MD, as directed by  Calogero Geisen [place], MD while in the presence of Rohini Jaroszewski [place], MD.   I, Alejo Beamer [place], MD, have reviewed all documentation for this visit. The documentation on 02/13/24 for the exam, diagnosis, procedures, and orders are all accurate and complete.  Filomena Pokorney G. [place], MD  [facility]. [place] office.

## 2024-02-13 NOTE — Patient Instructions (Addendum)
 A few things to remember from today's visit:  Paronychia of second toe of left foot - Plan: sulfamethoxazole-trimethoprim (BACTRIM DS) 800-160 MG tablet  Perianal dermatitis - Plan: nystatin-triamcinolone (MYCOLOG II) cream Use cream, small amount for up to 2 weeks then continue over the counter Hydrocortisone 1% daily as needed. Desitin at bedtime. Continue adequate hygiene.  For toe infection, recommend Bactrim. May aggravate diarrhea. Keep toe uncovered most of the day.  Do not use My Chart to request refills or for acute issues that need immediate attention. If you send a my chart message, it may take a few days to be addressed, specially if I am not in the office.  Please be sure medication list is accurate. If a new problem present, please set up appointment sooner than planned today.

## 2024-02-14 NOTE — Telephone Encounter (Signed)
 Pt had appointment with Dr [place]  on 02/13/24

## 2024-02-29 ENCOUNTER — Other Ambulatory Visit: Payer: Self-pay

## 2024-02-29 ENCOUNTER — Other Ambulatory Visit: Payer: Self-pay | Admitting: Oncology

## 2024-02-29 ENCOUNTER — Other Ambulatory Visit (HOSPITAL_COMMUNITY): Payer: Self-pay

## 2024-02-29 DIAGNOSIS — C9110 Chronic lymphocytic leukemia of B-cell type not having achieved remission: Secondary | ICD-10-CM

## 2024-02-29 MED ORDER — IBRUTINIB 420 MG PO TABS
ORAL_TABLET | ORAL | 3 refills | Status: DC
  Filled 2024-02-29: qty 28, 28d supply, fill #0
  Filled 2024-03-28: qty 28, 28d supply, fill #1
  Filled 2024-04-29: qty 28, 28d supply, fill #2
  Filled 2024-05-28: qty 28, 28d supply, fill #3

## 2024-02-29 NOTE — Progress Notes (Signed)
 Specialty Pharmacy Refill Coordination Note  Rachel Coffey is a 73 y.o. female contacted today regarding refills of specialty medication(s) Imbruvica.  Patient requested (Patient-Rptd) Delivery   Delivery date: (Patient-Rptd) 03/08/24   Verified address: (Patient-Rptd) [address] [place]   Medication will be filled on 03/07/24.   This fill date is pending response to refill request from provider. Patient is aware and if they have not received fill by intended date, they must follow up with pharmacy.

## 2024-03-07 ENCOUNTER — Other Ambulatory Visit: Payer: Self-pay

## 2024-03-07 ENCOUNTER — Encounter: Payer: Self-pay | Admitting: Oncology

## 2024-03-07 ENCOUNTER — Other Ambulatory Visit (HOSPITAL_COMMUNITY): Payer: Self-pay

## 2024-03-07 NOTE — Telephone Encounter (Signed)
 Sent message for oral oncology pharmacist to research

## 2024-03-11 ENCOUNTER — Other Ambulatory Visit: Payer: Self-pay | Admitting: Family Medicine

## 2024-03-12 ENCOUNTER — Ambulatory Visit (INDEPENDENT_AMBULATORY_CARE_PROVIDER_SITE_OTHER): Admitting: Family Medicine

## 2024-03-12 ENCOUNTER — Encounter: Payer: Self-pay | Admitting: Family Medicine

## 2024-03-12 VITALS — BP 124/76 | HR 71 | Temp 98.4°F | Wt 184.0 lb

## 2024-03-12 DIAGNOSIS — B351 Tinea unguium: Secondary | ICD-10-CM

## 2024-03-12 DIAGNOSIS — L03032 Cellulitis of left toe: Secondary | ICD-10-CM

## 2024-03-12 NOTE — Progress Notes (Signed)
   Subjective:    Patient ID: Rachel Coffey, female    DOB: 1951-08-11, 73 y.o.   MRN: [id number]  HPI Here to follow up on a paronychia in the left 2nd toe. She was here on 02-13-24 for swelling and pain around the nail. She was given 5 days of Bactrim  DS, and she has been soaking the foot. She says this has improved a lot, and she no longer has pain in the toe. She has been struggling with recurrent paronychia infections and a chronic fungal infection in the 2nd toe for the past year. She sees Dr. Tinnie Forehand for podiatric care, and he has injected the toe several times with steroids.    Review of Systems  Constitutional: Negative.   Respiratory: Negative.    Cardiovascular: Negative.   Skin:  Positive for wound.       Objective:   Physical Exam Constitutional:      Appearance: Normal appearance.  Cardiovascular:     Rate and Rhythm: Normal rate and regular rhythm.     Pulses: Normal pulses.     Heart sounds: Normal heart sounds.  Pulmonary:     Effort: Pulmonary effort is normal.     Breath sounds: Normal breath sounds.  Skin:    Comments: The left 2nd toenail is very misshapen and crusty. There is a zone of erythema at the nailbed. This is not warm or tender   Neurological:     Mental Status: She is alert.           Assessment & Plan:  She has onychomycosis and recurrent paronychias in the left 2nd toe. At this point I feel the best option would be to surgically remove the nail and to ablate the matrix. She will ask Dr. Lydia Sams to do this.  Rachel Diego, MD

## 2024-03-28 ENCOUNTER — Other Ambulatory Visit: Payer: Self-pay

## 2024-03-28 NOTE — Progress Notes (Signed)
 Specialty Pharmacy Refill Coordination Note  Rachel Coffey is a 73 y.o. female contacted today regarding refills of specialty medication(s) Ibrutinib  (IMBRUVICA )   Patient requested (Patient-Rptd) Delivery   Delivery date: (Patient-Rptd) 04/02/24   Verified address: (Patient-Rptd) [address] [place]   Medication will be filled on 04/01/24.

## 2024-04-01 ENCOUNTER — Encounter: Payer: Self-pay | Admitting: Family Medicine

## 2024-04-02 NOTE — Telephone Encounter (Signed)
 These look great! Stay on the current regimen

## 2024-04-05 ENCOUNTER — Ambulatory Visit (INDEPENDENT_AMBULATORY_CARE_PROVIDER_SITE_OTHER): Admitting: Family Medicine

## 2024-04-05 ENCOUNTER — Encounter: Payer: Self-pay | Admitting: Family Medicine

## 2024-04-05 VITALS — BP 126/60 | HR 71 | Temp 98.6°F | Wt 182.0 lb

## 2024-04-05 DIAGNOSIS — S39012A Strain of muscle, fascia and tendon of lower back, initial encounter: Secondary | ICD-10-CM | POA: Diagnosis not present

## 2024-04-05 MED ORDER — METHYLPREDNISOLONE ACETATE 80 MG/ML IJ SUSP
80.0000 mg | Freq: Once | INTRAMUSCULAR | Status: AC
Start: 2024-04-05 — End: 2024-04-05
  Administered 2024-04-05: 80 mg via INTRAMUSCULAR

## 2024-04-05 MED ORDER — METHYLPREDNISOLONE ACETATE 40 MG/ML IJ SUSP
40.0000 mg | Freq: Once | INTRAMUSCULAR | Status: AC
  Administered 2024-04-05: 40 mg via INTRAMUSCULAR

## 2024-04-05 NOTE — Addendum Note (Signed)
 Addended by: Philbert Brave on: 04/05/2024 11:18 AM   Modules accepted: Orders

## 2024-04-05 NOTE — Progress Notes (Signed)
   Subjective:    Patient ID: Rachel Coffey, female    DOB: Nov 25, 1950, 73 y.o.   MRN: [id number]  HPI Here for back pain that started a few days ago after she did a lot of yard work, including cutting the grass with a push mower and weeding flower beds. She has had spasms and pain in the lower back. Using heat, Arthritis Strength Tylenol , and Baclofen . Of note, she had compression fractures of T11 and T12  a few years ago.     Review of Systems  Constitutional: Negative.   Respiratory: Negative.    Cardiovascular: Negative.   Musculoskeletal:  Positive for back pain.       Objective:   Physical Exam Constitutional:      Appearance: Normal appearance.  Cardiovascular:     Rate and Rhythm: Normal rate and regular rhythm.     Pulses: Normal pulses.     Heart sounds: Normal heart sounds.  Pulmonary:     Effort: Pulmonary effort is normal.     Breath sounds: Normal breath sounds.  Musculoskeletal:     Comments: Tender in the lower thoracic and upper lumbar portion of her back. ROM is full.   Neurological:     Mental Status: She is alert.           Assessment & Plan:  Lower back strain. She is given a shot of DepoMedrol. Recheck as needed.  Rachel Diego, MD

## 2024-04-16 ENCOUNTER — Other Ambulatory Visit: Payer: Self-pay | Admitting: Family Medicine

## 2024-04-22 ENCOUNTER — Encounter: Payer: Self-pay | Admitting: Family Medicine

## 2024-04-23 ENCOUNTER — Other Ambulatory Visit (HOSPITAL_COMMUNITY): Payer: Self-pay

## 2024-04-23 NOTE — Telephone Encounter (Signed)
 We received the form from the pharmacy, form will be faxed after Dr Alyne Babinski signs.

## 2024-04-29 ENCOUNTER — Other Ambulatory Visit: Payer: Self-pay

## 2024-04-29 ENCOUNTER — Other Ambulatory Visit: Payer: Self-pay | Admitting: Pharmacy Technician

## 2024-04-29 NOTE — Progress Notes (Signed)
 Specialty Pharmacy Refill Coordination Note  Rachel Coffey is a 73 y.o. female contacted today regarding refills of specialty medication(s) Ibrutinib  (IMBRUVICA )   Patient requested Delivery   Delivery date: 05/01/24   Verified address: [address] [place]   Medication will be filled on 04/30/24.

## 2024-04-29 NOTE — Telephone Encounter (Signed)
 Spoke with pt pharmacy completed the form on the phone, advised to notified pt that the Rx has been approved.

## 2024-05-01 ENCOUNTER — Other Ambulatory Visit: Payer: Self-pay | Admitting: Family Medicine

## 2024-05-06 ENCOUNTER — Other Ambulatory Visit: Payer: Self-pay | Admitting: Family Medicine

## 2024-05-11 ENCOUNTER — Encounter (HOSPITAL_COMMUNITY): Payer: Self-pay | Admitting: Interventional Radiology

## 2024-05-20 ENCOUNTER — Ambulatory Visit (INDEPENDENT_AMBULATORY_CARE_PROVIDER_SITE_OTHER): Admitting: Family Medicine

## 2024-05-20 ENCOUNTER — Encounter: Payer: Self-pay | Admitting: Family Medicine

## 2024-05-20 VITALS — BP 136/50 | HR 81 | Temp 98.2°F | Wt 183.0 lb

## 2024-05-20 DIAGNOSIS — S76212A Strain of adductor muscle, fascia and tendon of left thigh, initial encounter: Secondary | ICD-10-CM | POA: Diagnosis not present

## 2024-05-20 NOTE — Progress Notes (Signed)
   Subjective:    Patient ID: Rachel Coffey, female    DOB: Jul 26, 1951, 73 y.o.   MRN: [id number]  HPI Here to check on pain in the left groin. This began in January when she got her foot caught in a rug at home and fell, twisting the left leg. The pain is located in the groin, and this is distinctly different from her back pain. This is a dull pain that comes and goes. She has taken Tylenol  and Ibiprofen with mixed results. Over the next few months the pain began to slowly improve until it was almost gone. Then last week as she was stepping off her riding lawn mower, she lost her balance and hyperextended the left leg again. Now the sharp pain has returned in the same area.    Review of Systems  Constitutional: Negative.   Respiratory: Negative.    Cardiovascular: Negative.   Musculoskeletal:  Positive for arthralgias.       Objective:   Physical Exam Constitutional:      Appearance: Normal appearance.     Comments: Walks easily, gets up and down from the exam table easily   Cardiovascular:     Rate and Rhythm: Normal rate and regular rhythm.     Pulses: Normal pulses.     Heart sounds: Normal heart sounds.  Pulmonary:     Effort: Pulmonary effort is normal.     Breath sounds: Normal breath sounds.  Musculoskeletal:     Comments: She is quite tender in the left groin in the area where her hip flexors insert onto the pelvis. Both hips have full ROM, but she has pain on full external rotation of the left hip. There is no lymphadenopathy in the groin   Neurological:     Mental Status: She is alert.           Assessment & Plan:  She has a groin strain that she reactivated last week. I told her to rest the hip and apply ice as needed. This should heal again over the next month or so. Recheck as needed.  Garnette Olmsted, MD

## 2024-05-27 ENCOUNTER — Encounter (INDEPENDENT_AMBULATORY_CARE_PROVIDER_SITE_OTHER): Payer: Self-pay

## 2024-05-27 DIAGNOSIS — H524 Presbyopia: Secondary | ICD-10-CM | POA: Diagnosis not present

## 2024-05-27 DIAGNOSIS — H16292 Other keratoconjunctivitis, left eye: Secondary | ICD-10-CM | POA: Diagnosis not present

## 2024-05-27 DIAGNOSIS — H2511 Age-related nuclear cataract, right eye: Secondary | ICD-10-CM | POA: Diagnosis not present

## 2024-05-27 DIAGNOSIS — H5203 Hypermetropia, bilateral: Secondary | ICD-10-CM | POA: Diagnosis not present

## 2024-05-27 DIAGNOSIS — H52222 Regular astigmatism, left eye: Secondary | ICD-10-CM | POA: Diagnosis not present

## 2024-05-27 DIAGNOSIS — E119 Type 2 diabetes mellitus without complications: Secondary | ICD-10-CM | POA: Diagnosis not present

## 2024-05-27 DIAGNOSIS — H25812 Combined forms of age-related cataract, left eye: Secondary | ICD-10-CM | POA: Diagnosis not present

## 2024-05-27 LAB — HM DIABETES EYE EXAM

## 2024-05-28 ENCOUNTER — Other Ambulatory Visit: Payer: Self-pay

## 2024-05-28 NOTE — Progress Notes (Signed)
 Specialty Pharmacy Refill Coordination Note  Rachel Coffey is a 73 y.o. female contacted today regarding refills of specialty medication(s) Ibrutinib  (IMBRUVICA )   Patient requested (Patient-Rptd) Delivery   Delivery date: 05/29/24   Verified address: (Patient-Rptd) [address] [place] [address]   Medication will be filled on 05/28/24.

## 2024-06-18 ENCOUNTER — Other Ambulatory Visit: Payer: Self-pay | Admitting: Oncology

## 2024-06-18 ENCOUNTER — Other Ambulatory Visit: Payer: Self-pay | Admitting: Pharmacy Technician

## 2024-06-18 ENCOUNTER — Encounter (INDEPENDENT_AMBULATORY_CARE_PROVIDER_SITE_OTHER): Payer: Self-pay

## 2024-06-18 ENCOUNTER — Other Ambulatory Visit: Payer: Self-pay

## 2024-06-18 DIAGNOSIS — C9110 Chronic lymphocytic leukemia of B-cell type not having achieved remission: Secondary | ICD-10-CM

## 2024-06-18 NOTE — Progress Notes (Signed)
 Specialty Pharmacy Refill Coordination Note  Rachel Coffey is a 73 y.o. female contacted today regarding refills of specialty medication(s) Ibrutinib  (IMBRUVICA )   Patient requested (Patient-Rptd) Delivery   Delivery date: 06/20/24 Verified address: (Patient-Rptd) [address] [place]   Medication will be filled on 06/19/24 or when MD approve  RR sent to MD

## 2024-06-19 ENCOUNTER — Other Ambulatory Visit: Payer: Self-pay

## 2024-06-19 ENCOUNTER — Other Ambulatory Visit (HOSPITAL_COMMUNITY): Payer: Self-pay

## 2024-06-19 MED ORDER — IBRUTINIB 420 MG PO TABS
ORAL_TABLET | ORAL | 3 refills | Status: DC
  Filled 2024-06-19: qty 28, 28d supply, fill #0
  Filled 2024-07-16: qty 28, 28d supply, fill #1
  Filled 2024-08-14 – 2024-08-15 (×2): qty 28, 28d supply, fill #2
  Filled 2024-09-10 – 2024-09-12 (×2): qty 28, 28d supply, fill #3

## 2024-06-20 ENCOUNTER — Other Ambulatory Visit: Payer: Self-pay

## 2024-06-20 DIAGNOSIS — D2271 Melanocytic nevi of right lower limb, including hip: Secondary | ICD-10-CM | POA: Diagnosis not present

## 2024-06-20 DIAGNOSIS — D225 Melanocytic nevi of trunk: Secondary | ICD-10-CM | POA: Diagnosis not present

## 2024-06-20 DIAGNOSIS — Z85828 Personal history of other malignant neoplasm of skin: Secondary | ICD-10-CM | POA: Diagnosis not present

## 2024-06-20 DIAGNOSIS — Z808 Family history of malignant neoplasm of other organs or systems: Secondary | ICD-10-CM | POA: Diagnosis not present

## 2024-06-20 DIAGNOSIS — L814 Other melanin hyperpigmentation: Secondary | ICD-10-CM | POA: Diagnosis not present

## 2024-06-20 DIAGNOSIS — L578 Other skin changes due to chronic exposure to nonionizing radiation: Secondary | ICD-10-CM | POA: Diagnosis not present

## 2024-06-20 DIAGNOSIS — L720 Epidermal cyst: Secondary | ICD-10-CM | POA: Diagnosis not present

## 2024-06-20 DIAGNOSIS — Z85820 Personal history of malignant melanoma of skin: Secondary | ICD-10-CM | POA: Diagnosis not present

## 2024-06-20 DIAGNOSIS — L821 Other seborrheic keratosis: Secondary | ICD-10-CM | POA: Diagnosis not present

## 2024-06-20 NOTE — Progress Notes (Signed)
 Specialty Pharmacy Ongoing Clinical Assessment Note  Rachel Coffey is a 73 y.o. female who is being followed by the specialty pharmacy service for RxSp Oncology   Patient's specialty medication(s) reviewed today: Ibrutinib  (IMBRUVICA )   Missed doses in the last 4 weeks: 0   Patient/Caregiver did not have any additional questions or concerns.   Therapeutic benefit summary: Patient is achieving benefit   Adverse events/side effects summary: Experienced adverse events/side effects (intermittent nausea, pt wants to discuss at next OV)   Patient's therapy is appropriate to: Continue    Goals Addressed             This Visit's Progress    Achieve or maintain remission   On track    Patient is on track. Patient will maintain adherence. Patient is in clinical remission from CLL.          Follow up: 6 months  [facility]

## 2024-07-11 ENCOUNTER — Other Ambulatory Visit (HOSPITAL_COMMUNITY): Payer: Self-pay

## 2024-07-16 ENCOUNTER — Other Ambulatory Visit: Payer: Self-pay

## 2024-07-16 NOTE — Progress Notes (Signed)
 Specialty Pharmacy Refill Coordination Note  Rachel Coffey is a 73 y.o. female contacted today regarding refills of specialty medication(s) Ibrutinib  (IMBRUVICA )   Patient requested Delivery   Delivery date: 07/19/24   Verified address: [address], [place], [address]   Medication will be filled on 07/18/24.

## 2024-07-18 ENCOUNTER — Other Ambulatory Visit: Payer: Self-pay

## 2024-07-26 ENCOUNTER — Encounter: Payer: Self-pay | Admitting: Oncology

## 2024-07-26 ENCOUNTER — Inpatient Hospital Stay: Attending: Oncology | Admitting: Oncology

## 2024-07-26 ENCOUNTER — Inpatient Hospital Stay

## 2024-07-26 VITALS — BP 115/60 | HR 77 | Temp 98.7°F | Resp 18 | Ht 67.0 in | Wt 183.1 lb

## 2024-07-26 DIAGNOSIS — R252 Cramp and spasm: Secondary | ICD-10-CM | POA: Diagnosis not present

## 2024-07-26 DIAGNOSIS — C9110 Chronic lymphocytic leukemia of B-cell type not having achieved remission: Secondary | ICD-10-CM | POA: Diagnosis not present

## 2024-07-26 DIAGNOSIS — G35 Multiple sclerosis: Secondary | ICD-10-CM | POA: Diagnosis not present

## 2024-07-26 DIAGNOSIS — J449 Chronic obstructive pulmonary disease, unspecified: Secondary | ICD-10-CM | POA: Diagnosis not present

## 2024-07-26 DIAGNOSIS — E119 Type 2 diabetes mellitus without complications: Secondary | ICD-10-CM | POA: Insufficient documentation

## 2024-07-26 DIAGNOSIS — Z85820 Personal history of malignant melanoma of skin: Secondary | ICD-10-CM | POA: Diagnosis not present

## 2024-07-26 LAB — CMP (CANCER CENTER ONLY)
ALT: 10 U/L (ref 0–44)
AST: 21 U/L (ref 15–41)
Albumin: 4.2 g/dL (ref 3.5–5.0)
Alkaline Phosphatase: 68 U/L (ref 38–126)
Anion gap: 9 (ref 5–15)
BUN: 26 mg/dL — ABNORMAL HIGH (ref 8–23)
CO2: 28 mmol/L (ref 22–32)
Calcium: 9.6 mg/dL (ref 8.9–10.3)
Chloride: 102 mmol/L (ref 98–111)
Creatinine: 1.05 mg/dL — ABNORMAL HIGH (ref 0.44–1.00)
GFR, Estimated: 57 mL/min — ABNORMAL LOW (ref >60)
Glucose, Bld: 127 mg/dL — ABNORMAL HIGH (ref 70–99)
Potassium: 5.1 mmol/L (ref 3.5–5.1)
Sodium: 139 mmol/L (ref 135–145)
Total Bilirubin: 0.5 mg/dL (ref 0.0–1.2)
Total Protein: 6.2 g/dL — ABNORMAL LOW (ref 6.5–8.1)

## 2024-07-26 LAB — CBC WITH DIFFERENTIAL (CANCER CENTER ONLY)
Abs Immature Granulocytes: 0.02 K/uL (ref 0.00–0.07)
Basophils Absolute: 0.1 K/uL (ref 0.0–0.1)
Basophils Relative: 1 %
Eosinophils Absolute: 0.1 K/uL (ref 0.0–0.5)
Eosinophils Relative: 1 %
HCT: 42.8 % (ref 36.0–46.0)
Hemoglobin: 13.8 g/dL (ref 12.0–15.0)
Immature Granulocytes: 0 %
Lymphocytes Relative: 19 %
Lymphs Abs: 1.7 K/uL (ref 0.7–4.0)
MCH: 29.6 pg (ref 26.0–34.0)
MCHC: 32.2 g/dL (ref 30.0–36.0)
MCV: 91.8 fL (ref 80.0–100.0)
Monocytes Absolute: 0.7 K/uL (ref 0.1–1.0)
Monocytes Relative: 9 %
Neutro Abs: 5.9 K/uL (ref 1.7–7.7)
Neutrophils Relative %: 70 %
Platelet Count: 166 K/uL (ref 150–400)
RBC: 4.66 MIL/uL (ref 3.87–5.11)
RDW: 13.1 % (ref 11.5–15.5)
WBC Count: 8.7 K/uL (ref 4.0–10.5)
nRBC: 0 % (ref 0.0–0.2)

## 2024-07-26 NOTE — Progress Notes (Signed)
 [facility] OFFICE PROGRESS NOTE   Diagnosis: CLL  INTERVAL HISTORY:   Rachel Coffey returns as scheduled.  She continues ibrutinib .  No rash or diarrhea.  Stable arthritis pain.  She continues to have pain at the left upper thigh.  The pain is worse with position change and has been present for 9 months.  She has intermittent muscle spasms.  Magnesium helps.  Objective:  Vital signs in last 24 hours:  Blood pressure 115/60, pulse 77, temperature 98.7 F (37.1 C), temperature source Temporal, resp. rate 18, height 5' 7 (1.702 m), weight 183 lb 1.9 oz (83.1 kg), SpO2 97%.   Lymphatics: No cervical, supraclavicular, axillary, or inguinal nodes Resp: Lungs clear bilaterally Cardio: Regular rate and rhythm GI: No hepatosplenomegaly, no mass, nontender Vascular: No leg edema Musculoskeletal: Discomfort in the left groin with internal/external rotation of the left hip, no tenderness at the trochanter, tender over the upper medial left thigh muscular/tendon, no mass   Lab Results:  Lab Results  Component Value Date   WBC 8.7 07/26/2024   HGB 13.8 07/26/2024   HCT 42.8 07/26/2024   MCV 91.8 07/26/2024   PLT 166 07/26/2024   NEUTROABS 5.9 07/26/2024    CMP  Lab Results  Component Value Date   NA 139 07/26/2024   K 5.1 07/26/2024   CL 102 07/26/2024   CO2 28 07/26/2024   GLUCOSE 127 (H) 07/26/2024   BUN 26 (H) 07/26/2024   CREATININE 1.05 (H) 07/26/2024   CALCIUM 9.6 07/26/2024   PROT 6.2 (L) 07/26/2024   ALBUMIN 4.2 07/26/2024   AST 21 07/26/2024   ALT 10 07/26/2024   ALKPHOS 68 07/26/2024   BILITOT 0.5 07/26/2024   GFRNONAA 57 (L) 07/26/2024   GFRAA >60 06/22/2020    Medications: I have reviewed the patient's current medications.   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 06/20/2016 Monoclonal Kappa restricted B-cell population identified on flow cytometry Staging PET scan 06/13/2016  confirmed hypermetabolic abdominal/pelvic lymphadenopathy FISH panel positive for 11q-,-12,13q-, and 17p-(10% of cells) Peripheral blood flow cytometry 09/22/2016 at [facility]-CD5/CD23 positive For restricted B-cell population, 2% of lymphocytes Peripheral blood IGH mutation analysis at [facility] 09/22/2016-unmutated CT 06/06/2017-generally stable diffuse lymphadenopathy, node in the pelvis is smaller, some the lymph nodes are larger CTs 07/02/2018- slight decrease in size of retrocrural, retroperitoneal, and mesenteric adenopathy, new 6 mm left upper lobe nodule CT chest 01/01/2019- increase in size of left upper lobe lung nodule-inflammatory appearance, other stable lung nodules, right retrocrural and upper abdominal adenopathy has progressed CTs 07/04/2019- progressive lower thoracic and abdominal adenopathy with dominant mass-effect on the stomach and pancreas, new heterogeneity in the spleen- possible splenic lesion, stable lung nodules Ibrutinib  starting 08/14/2019 CT abdomen/pelvis 11/19/2019-decreased size of dominant abdominal mass and decreased abdominal lymphadenopathy CTs 06/22/2020-2 upper abdominal lymph nodes have decreased in size, no evidence of disease progression, unchanged bilateral lung nodules   Stage IB (T1b,N0) melanoma of the right forearm, status post a wide excision and sentinel lymph node biopsy at [facility] March 2017     3.    Multiple sclerosis   4.    Diabetes   5.   COPD  6.    T12 compression fracture after a fall May 22, T12 kyphoplasty 05/07/2021      Disposition: Ms. Mamaril is in clinical remission from CLL.  She continues ibrutinib .  It is possible the ibrutinib  is contributing to the muscle spasms.  She does not wish to change  to a different BTK inhibitor.  The etiology of the left groin/upper thigh discomfort is unclear.  She plans to see Dr. Johnny next month and will request an orthopedic consult.  She will obtain an influenza and COVID-19 vaccine.  Ms. Kolarik  will return for an office and lab visit in 6 months.  Arley Hof, MD  07/26/2024  11:46 AM

## 2024-07-29 ENCOUNTER — Other Ambulatory Visit: Payer: Self-pay | Admitting: Family Medicine

## 2024-08-06 ENCOUNTER — Ambulatory Visit: Admitting: Podiatry

## 2024-08-11 ENCOUNTER — Other Ambulatory Visit: Payer: Self-pay | Admitting: Family Medicine

## 2024-08-13 ENCOUNTER — Other Ambulatory Visit: Payer: Self-pay

## 2024-08-14 ENCOUNTER — Ambulatory Visit: Payer: Medicare Other | Admitting: Family Medicine

## 2024-08-14 ENCOUNTER — Other Ambulatory Visit: Payer: Self-pay

## 2024-08-14 ENCOUNTER — Encounter: Payer: Self-pay | Admitting: Family Medicine

## 2024-08-14 ENCOUNTER — Encounter (INDEPENDENT_AMBULATORY_CARE_PROVIDER_SITE_OTHER): Payer: Self-pay

## 2024-08-14 VITALS — BP 110/60 | HR 96 | Temp 98.2°F | Ht 67.0 in | Wt 182.0 lb

## 2024-08-14 DIAGNOSIS — E538 Deficiency of other specified B group vitamins: Secondary | ICD-10-CM

## 2024-08-14 DIAGNOSIS — R600 Localized edema: Secondary | ICD-10-CM | POA: Diagnosis not present

## 2024-08-14 DIAGNOSIS — G35D Multiple sclerosis, unspecified: Secondary | ICD-10-CM | POA: Diagnosis not present

## 2024-08-14 DIAGNOSIS — G5793 Unspecified mononeuropathy of bilateral lower limbs: Secondary | ICD-10-CM | POA: Diagnosis not present

## 2024-08-14 DIAGNOSIS — E119 Type 2 diabetes mellitus without complications: Secondary | ICD-10-CM

## 2024-08-14 DIAGNOSIS — E559 Vitamin D deficiency, unspecified: Secondary | ICD-10-CM

## 2024-08-14 DIAGNOSIS — M150 Primary generalized (osteo)arthritis: Secondary | ICD-10-CM

## 2024-08-14 DIAGNOSIS — E785 Hyperlipidemia, unspecified: Secondary | ICD-10-CM

## 2024-08-14 DIAGNOSIS — K219 Gastro-esophageal reflux disease without esophagitis: Secondary | ICD-10-CM

## 2024-08-14 DIAGNOSIS — S76012D Strain of muscle, fascia and tendon of left hip, subsequent encounter: Secondary | ICD-10-CM

## 2024-08-14 NOTE — Progress Notes (Signed)
 Subjective:    Patient ID: Rachel Coffey, female    DOB: 1951/05/15, 73 y.o.   MRN: [id number]  HPI Here to follow up on issues. She has a few issues to discuss. First she began having intermttient swelling of her legs about 9 months ago. The left leg is usually more involved than the right leg. The selling goes away after several days. No SOB. She also asks to check her left groin area. She has had sharp pains in this area for about 10 months after she fell at home. She had been seeing a neurologist in [place] for her MS, but she asks to be referred to someone in [place]. She sees Dr. Audery for her hx of CLL, and her labs on 07-26-24 were normal except for a creatinine of 1.05 and a GFR of 57.    Review of Systems  Constitutional: Negative.   HENT: Negative.    Eyes: Negative.   Respiratory: Negative.    Cardiovascular:  Positive for leg swelling.  Gastrointestinal: Negative.   Genitourinary:  Negative for decreased urine volume, difficulty urinating, dyspareunia, dysuria, enuresis, flank pain, frequency, hematuria, pelvic pain and urgency.  Musculoskeletal:  Positive for arthralgias.  Skin: Negative.   Neurological:  Positive for numbness. Negative for weakness and headaches.  Psychiatric/Behavioral: Negative.         Objective:   Physical Exam Constitutional:      General: She is not in acute distress.    Appearance: Normal appearance. She is well-developed.  HENT:     Head: Normocephalic and atraumatic.     Right Ear: External ear normal.     Left Ear: External ear normal.     Nose: Nose normal.     Mouth/Throat:     Pharynx: No oropharyngeal exudate.  Eyes:     General: No scleral icterus.    Conjunctiva/sclera: Conjunctivae normal.     Pupils: Pupils are equal, round, and reactive to light.  Neck:     Thyroid : No thyromegaly.     Vascular: No JVD.  Cardiovascular:     Rate and Rhythm: Normal rate and regular rhythm.     Pulses: Normal pulses.     Heart  sounds: Normal heart sounds. No murmur heard.    No friction rub. No gallop.  Pulmonary:     Effort: Pulmonary effort is normal. No respiratory distress.     Breath sounds: Normal breath sounds. No wheezing or rales.  Chest:     Chest wall: No tenderness.  Abdominal:     General: Bowel sounds are normal. There is no distension.     Palpations: Abdomen is soft. There is no mass.     Tenderness: There is no abdominal tenderness. There is no guarding or rebound.  Musculoskeletal:        General: No tenderness. Normal range of motion.     Cervical back: Normal range of motion and neck supple.     Comments: She is tender over the insertion of the left hip flexor to the pelvis   Lymphadenopathy:     Cervical: No cervical adenopathy.  Skin:    General: Skin is warm and dry.     Findings: No erythema or rash.  Neurological:     General: No focal deficit present.     Mental Status: She is alert and oriented to person, place, and time.     Cranial Nerves: No cranial nerve deficit.     Motor: No abnormal muscle tone.  Coordination: Coordination normal.     Deep Tendon Reflexes: Reflexes are normal and symmetric. Reflexes normal.  Psychiatric:        Mood and Affect: Mood normal.        Behavior: Behavior normal.        Thought Content: Thought content normal.        Judgment: Judgment normal.           Assessment & Plan:  She has MS which has been quiet for the past 20 years, and we will refer her to Dr. Charlie Coffey here in [place]. He can also evaluate her LE neuropathy. We will check a B12 level today. Check lipids and TSH. Recheck a BMET for the renal function. Check an A1c for the type 2 diabetes. We will evaluate the leg edema with venous dopplers of both legs. She has a chronic hip flexor strain that should heal over time. I personally spent a total of 35 minutes in the care of the patient today including getting/reviewing separately obtained history, performing a medically  appropriate exam/evaluation, placing orders, and independently interpreting results.  Rachel Olmsted, MD

## 2024-08-15 ENCOUNTER — Other Ambulatory Visit: Payer: Self-pay | Admitting: Pharmacy Technician

## 2024-08-15 ENCOUNTER — Ambulatory Visit: Payer: Self-pay | Admitting: Family Medicine

## 2024-08-15 ENCOUNTER — Encounter: Payer: Self-pay | Admitting: Family Medicine

## 2024-08-15 ENCOUNTER — Other Ambulatory Visit: Payer: Self-pay

## 2024-08-15 DIAGNOSIS — Z23 Encounter for immunization: Secondary | ICD-10-CM | POA: Diagnosis not present

## 2024-08-15 DIAGNOSIS — G8929 Other chronic pain: Secondary | ICD-10-CM

## 2024-08-15 DIAGNOSIS — E119 Type 2 diabetes mellitus without complications: Secondary | ICD-10-CM

## 2024-08-15 LAB — BASIC METABOLIC PANEL WITH GFR
BUN: 22 mg/dL (ref 6–23)
CO2: 25 meq/L (ref 19–32)
Calcium: 9.4 mg/dL (ref 8.4–10.5)
Chloride: 102 meq/L (ref 96–112)
Creatinine, Ser: 0.99 mg/dL (ref 0.40–1.20)
GFR: 57.57 mL/min — ABNORMAL LOW (ref >60.00)
Glucose, Bld: 93 mg/dL (ref 70–99)
Potassium: 4.8 meq/L (ref 3.5–5.1)
Sodium: 141 meq/L (ref 135–145)

## 2024-08-15 LAB — LIPID PANEL
Cholesterol: 137 mg/dL (ref 0–200)
HDL: 46.7 mg/dL (ref >39.00)
LDL Cholesterol: 44 mg/dL (ref 0–99)
NonHDL: 90.3
Total CHOL/HDL Ratio: 3
Triglycerides: 231 mg/dL — ABNORMAL HIGH (ref 0.0–149.0)
VLDL: 46.2 mg/dL — ABNORMAL HIGH (ref 0.0–40.0)

## 2024-08-15 LAB — VITAMIN B12: Vitamin B-12: 247 pg/mL (ref 211–911)

## 2024-08-15 LAB — HEMOGLOBIN A1C: Hgb A1c MFr Bld: 6.6 % — ABNORMAL HIGH (ref 4.6–6.5)

## 2024-08-15 LAB — TSH: TSH: 3.17 u[IU]/mL (ref 0.35–5.50)

## 2024-08-15 NOTE — Progress Notes (Signed)
 Specialty Pharmacy Refill Coordination Note  Rachel Coffey is a 73 y.o. female contacted today regarding refills of specialty medication(s) Ibrutinib  (IMBRUVICA )   Patient requested (Patient-Rptd) Delivery   Delivery date: 08/21/24 Verified address: (Patient-Rptd) [address] [place] [address]   Medication will be filled on 08/20/24.

## 2024-08-19 NOTE — Telephone Encounter (Signed)
 Please advise if ok to add med to pt allergy list

## 2024-08-20 ENCOUNTER — Other Ambulatory Visit: Payer: Self-pay

## 2024-08-20 NOTE — Telephone Encounter (Signed)
 So what was her reaction to this?

## 2024-08-20 NOTE — Telephone Encounter (Signed)
 Okay, I added Aubagio to her allergy list

## 2024-08-26 ENCOUNTER — Ambulatory Visit (HOSPITAL_COMMUNITY)
Admission: RE | Admit: 2024-08-26 | Discharge: 2024-08-26 | Disposition: A | Discharge status: Home / Self Care | Source: Ambulatory Visit | Attending: Family Medicine | Admitting: Family Medicine

## 2024-08-26 DIAGNOSIS — R600 Localized edema: Secondary | ICD-10-CM | POA: Insufficient documentation

## 2024-08-27 NOTE — Addendum Note (Signed)
 Addended by: JOHNNY SENIOR A on: 08/27/2024 12:46 PM   Modules accepted: Orders

## 2024-08-27 NOTE — Telephone Encounter (Signed)
 Done

## 2024-09-03 DIAGNOSIS — Z23 Encounter for immunization: Secondary | ICD-10-CM | POA: Diagnosis not present

## 2024-09-08 NOTE — Progress Notes (Unsigned)
 [facility]  PATIENT: Rachel Coffey DOB: May 03, 1951  REFERRING DOCTOR OR PCP:  Garnette Olmsted, MD SOURCE: Patient, notes from primary care and [facility] neurology  _________________________________   HISTORICAL  CHIEF COMPLAINT:  Chief Complaint  Patient presents with   New Patient (Initial Visit)    Pt in room 11. Alone. Internal referral for MS TOC.    HISTORY OF PRESENT ILLNESS:  I had the pleasure of seeing your patient, Rachel Coffey, at the MS center [facility] for neurologic consultation regarding her multiple sclerosis.  She is a 73 year old woman with CLL, NIDDM who was diagnosed with MS in October 1999 after an MRI ordered by her PCP showed abnormality consistent with MS. At the time she had bilateral leg tingling x several weeks followed a few months later by left head numbness and arm numbness and truncal dysesthesia.   After the MRI, she was referred to Dr. Excell in [facility] and then Dr. Rachelle and Miquel Merritt, NP when he retired.  She was placed on Avonex between 2001 and 2011 and was stable.   She saw Dr. Juliane once and tried Aubagio.  She felt poorly and discontinued.   She has been off any DMT since 2011.   She has not had any exacerbations off medication  Currently, she notes balance is off and she needs to hold on the shower if she closes her eyes.   She uses the bannister on stairs.   She has a reduced gai but also notes a right Baker's cyst and DJD elsewhere.   She feels MS pays less role in her gait issues.   Recently also pulled a hip flexor.  She was walking 1.5-2 miles a couple times a week prior to this.    Legs are strong.  She has peripheral polyneuropathy with foot numbness (has NIDDM x 20 years).  This can be uncomfortable but not painful.   However, occasioanlly electrical shock down her legs and a wobbly sensation.    She had a T12 compression fracture while vacationing after a fall in 2022 requiring kyphoplasty.     Vision is fine.  Bladder is fine.  She denies issues with cognition and mood.  However, rarely has word finding difficulty.    She has a deep ache that is helped by baclofen    She rarely takes meloxicam  (1/month)  She has lymphatic lymphoma/CLL(B cell) and is on ibrutinib  420 mg once daily.  She sees Dr. Cloretta and studies showed FISH panel positive for 11q-,-12,13q-, and 17p-(10% of cells).   Peripheral blood flow cytometry 09/22/2016 at [facility]-CD5/CD23 positive For restricted B-cell population, 2% of lymphocytes   PET shows hypermetabolic abdominal/pelvic lymphadenopathy .   Concurrently, she also has melanoma diagnosed and treated with surgery in 2017 (saw [facility]) but biopsy of LN was c/w with CLL/lyphoma.   She has on ibrutinib  since 2020 and tolerates it ok - has a deep ache.   Imaging: MRI of the brain 05/12/2020 shows about 1 dozen T2/FLAIR hyperintense foci in the periventricular, subcortical and deep white matter.  Some of the periventricular foci seen radially oriented to the ventricles.  No foci enhanced after contrast.  The foci are consistent with chronic demyelinating plaque associated with multiple sclerosis though some of the deep white matter foci could also be due to age-related chronic microvascular ischemic change  MRI of the thoracic spine 10/26/2021 and 05/03/2021 showed a normal spinal cord.  There was a T12 compression fracture that was acute on  05/03/2021 associated with edema on STIR images  REVIEW OF SYSTEMS: Constitutional: No fevers, chills, sweats, or change in appetite Eyes: No visual changes, double vision, eye pain Ear, nose and throat: No hearing loss, ear pain, nasal congestion, sore throat Cardiovascular: No chest pain, palpitations Respiratory:  No shortness of breath at rest or with exertion.   No wheezes GastrointestinaI: No nausea, vomiting, diarrhea, abdominal pain, fecal incontinence Genitourinary:  No dysuria, urinary retention or frequency.  No  nocturia. Musculoskeletal: She has muscle aches.  Right Baker's cyst Integumentary: No rash, pruritus, skin lesions Neurological: as above Psychiatric: No depression at this time.  No anxiety Endocrine: No palpitations, diaphoresis, change in appetite, change in weigh or increased thirst.  She has diabetes mellitus type 2 Hematologic/Lymphatic: See above. Allergic/Immunologic: No itchy/runny eyes, nasal congestion, recent allergic reactions, rashes  ALLERGIES: Allergies  Allergen Reactions   Amoxicillin-Pot Clavulanate Other (See Comments)    Pt preferred - causes upset stomach    Aubagio [Teriflunomide] Diarrhea   Codeine Itching   Fenofibrate     aching joints   Hydrocodone Itching   Hydrocodone-Acetaminophen  Itching   Lisinopril  Cough   Metformin And Related     Makes pt deathly ill   Doxycycline  Rash    HOME MEDICATIONS:  Current Outpatient Medications:    acetaminophen  (TYLENOL ) 650 MG CR tablet, Take 650 mg by mouth daily., Disp: , Rfl:    ALPRAZolam  (XANAX ) 0.5 MG tablet, Take 0.5 mg by mouth daily as needed (for pain). , Disp: , Rfl:    baclofen  (LIORESAL ) 20 MG tablet, Take 20 mg by mouth 2 (two) times daily., Disp: , Rfl:    Cholecalciferol (VITAMIN D ) 50 MCG (2000 UT) CAPS, Take 2,000 Units by mouth daily. , Disp: , Rfl:    CINNAMON PO, Take 2,000 mg by mouth daily., Disp: , Rfl:    diclofenac Sodium (VOLTAREN) 1 % GEL, Apply 1 application. topically 4 (four) times daily as needed (pain)., Disp: , Rfl:    esomeprazole (NEXIUM) 20 MG capsule, Take 20 mg by mouth daily., Disp: , Rfl:    fluticasone (FLONASE) 50 MCG/ACT nasal spray, Place 1 spray into both nostrils daily as needed for allergies. Sometimes bid, Disp: , Rfl:    glipiZIDE  (GLUCOTROL  XL) 5 MG 24 hr tablet, TAKE ONE TABLET (5MG  TOTAL) BY MOUTH IN THE MORNING AND AT BEDTIME, Disp: 180 tablet, Rfl: 3   ibrutinib  (IMBRUVICA ) 420 MG tablet, TAKE 1 TABLET BY MOUTH ONCE DAILY.  TABLET SHOULD BE TAKEN WITH A GLASS  OF WATER., Disp: 28 tablet, Rfl: 3   Lancets (ONETOUCH DELICA PLUS LANCET33G) MISC, TEST ONCE DAILY, Disp: 100 each, Rfl: 1   Lancets Misc. MISC, 1 each by Does not apply route as directed., Disp: 100 each, Rfl: 0   loratadine (CLARITIN) 10 MG tablet, Take 10 mg by mouth daily as needed (allergies)., Disp: , Rfl:    losartan  (COZAAR ) 25 MG tablet, TAKE ONE TABLET (25MG  TOTAL) BY MOUTH DAILY, Disp: 90 tablet, Rfl: 3   MAGNESIUM GLYCINATE PO, Take 2 tablets by mouth daily. Strength 265 mg-for muscle spasms (Patient taking differently: Take 4 tablets by mouth daily. Strength 265 mg-for muscle spasms), Disp: , Rfl:    meloxicam  (MOBIC ) 15 MG tablet, TAKE ONE (1) TABLET BY MOUTH EVERY DAY AS NEEDED FOR KNEE PAIN, Disp: 30 tablet, Rfl: 0   ONETOUCH VERIO test strip, USE TO TEST BLOOD SUGAR ONCE DAILY, Disp: 100 strip, Rfl: 0   simvastatin  (ZOCOR ) 20 MG tablet, TAKE  ONE TABLET (20MG  TOTAL) BY MOUTH ATBEDTIME, Disp: 90 tablet, Rfl: 3   VENTOLIN  HFA 108 (90 Base) MCG/ACT inhaler, INHALE 2 PUFFS EVERY 4 TO 6 HOURS AS NEEDED. SHAKE WELL BEFORE EACH USE., Disp: 18 g, Rfl: 5  PAST MEDICAL HISTORY: Past Medical History:  Diagnosis Date   Asthma    stress induced with allergies   Baker's cyst of knee, left    Cancer (HCC)    CLL (chronic lymphocytic leukemia) (HCC)    sees Dr. Cloretta   Compression fracture of thoracic vertebra (HCC)    at T11 and T12, sees Dr. Arley Helling   Genital warts    Hyperlipidemia    MS (multiple sclerosis)    sees Dr. Norleen Lunger of [facility] at the [facility] office   Type 2 diabetes mellitus [facility])     PAST SURGICAL HISTORY: Past Surgical History:  Procedure Laterality Date   ABDOMINAL HYSTERECTOMY     with 1 ovary removed-[facility]   CHOLECYSTECTOMY N/A 02/25/2013   Procedure: LAPAROSCOPIC CHOLECYSTECTOMY WITH INTRAOPERATIVE CHOLANGIOGRAM;  Surgeon: Krystal CHRISTELLA Spinner, MD;  Location: [facility];  Service: General;  Laterality: N/A;   COLONOSCOPY  08/17/2023   per Dr. Belvie Just, adenomatous polyp, repeat in 7 yrs   IR KYPHO THORACIC WITH BONE BIOPSY  05/07/2021   TONSILLECTOMY      FAMILY HISTORY: Family History  Problem Relation Age of Onset   COPD Mother    Heart disease Mother    Diabetes Father    Heart disease Sister     SOCIAL HISTORY: Social History   Socioeconomic History   Marital status: Married    Spouse name: Not on file   Number of children: Not on file   Years of education: Not on file   Highest education level: Associate degree: academic program  Occupational History   Not on file  Tobacco Use   Smoking status: Former    Current packs/day: 0.00    Types: Cigarettes    Quit date: 01/31/2005    Years since quitting: 19.6   Smokeless tobacco: Never  Vaping Use   Vaping status: Never Used  Substance and Sexual Activity   Alcohol use: No   Drug use: No   Sexual activity: Never    Partners: Male    Comment: TVH--still has 1 ovary  Other Topics Concern   Not on file  Social History Narrative   Not on file   Social Drivers of Health   Financial Resource Strain: Low Risk  (05/17/2024)   Overall Financial Resource Strain (CARDIA)    Difficulty of Paying Living Expenses: Not hard at all  Food Insecurity: No Food Insecurity (05/17/2024)   Hunger Vital Sign    Worried About Running Out of Food in the Last Year: Never true    Ran Out of Food in the Last Year: Never true  Transportation Needs: No Transportation Needs (05/17/2024)   PRAPARE - Administrator, Civil Service (Medical): No    Lack of Transportation (Non-Medical): No  Physical Activity: Unknown (05/17/2024)   Exercise Vital Sign    Days of Exercise per Week: 3 days    Minutes of Exercise per Session: Not on file  Stress: No Stress Concern Present (05/17/2024)   [employer] of Occupational Health - Occupational Stress Questionnaire    Feeling of Stress: Not at all  Social Connections: Socially Integrated (05/17/2024)   Social Connection and Isolation Panel     Frequency of Communication with Friends  and Family: Three times a week    Frequency of Social Gatherings with Friends and Family: Once a week    Attends Religious Services: More than 4 times per year    Active Member of [employer] or Organizations: Yes    Attends Banker Meetings: More than 4 times per year    Marital Status: Married  Catering Manager Violence: Not At Risk (08/10/2023)   Humiliation, Afraid, Rape, and Kick questionnaire    Fear of Current or Ex-Partner: No    Emotionally Abused: No    Physically Abused: No    Sexually Abused: No       PHYSICAL EXAM  Vitals:   09/09/24 0903  BP: 122/64  Pulse: 84  Weight: 180 lb 8 oz (81.9 kg)  Height: 5' 5.5 (1.664 m)    Body mass index is 29.58 kg/m.   General: The patient is well-developed and well-nourished and in no acute distress  HEENT:  Head is [place]/AT.  Sclera are anicteric.     Neck: No carotid bruits are noted.  The neck is nontender.  Cardiovascular: The heart has a regular rate and rhythm with a normal S1 and S2. There were no murmurs, gallops or rubs.    Skin: Extremities are without rash or  edema.  Musculoskeletal:  Back is nontender  Neurologic Exam  Mental status: The patient is alert and oriented x 3 at the time of the examination. The patient has apparent normal recent and remote memory, with an apparently normal attention span and concentration ability.   Speech is normal.  Cranial nerves: Extraocular movements are full.   There is good facial sensation to soft touch bilaterally. Facial strength is normal.  Trapezius and sternocleidomastoid strength is normal. No dysarthria is noted.  The tongue is midline, and the patient has symmetric elevation of the soft palate. No obvious hearing deficits are noted.  Motor:  Muscle bulk is normal.   Tone is normal. Strength is  5 / 5 in all 4 extremities.   Sensory: Sensory testing is intact to pinprick, soft touch and vibration sensation in all 4  extremities.  Coordination: Cerebellar testing reveals good finger-nose-finger and heel-to-shin bilaterally.  Gait and station: Station is normal.   Gait is mildly arthritic . Tandem gait is mildly wide. Romberg is negative.   Reflexes: Deep tendon reflexes are symmetric and normal bilaterally.   Plantar responses are flexor.    DIAGNOSTIC DATA (LABS, IMAGING, TESTING) - I reviewed patient records, labs, notes, testing and imaging myself where available.  Lab Results  Component Value Date   WBC 8.7 07/26/2024   HGB 13.8 07/26/2024   HCT 42.8 07/26/2024   MCV 91.8 07/26/2024   PLT 166 07/26/2024      Component Value Date/Time   NA 141 08/14/2024 1403   NA 142 06/22/2017 0853   K 4.8 08/14/2024 1403   K 4.4 06/22/2017 0853   CL 102 08/14/2024 1403   CO2 25 08/14/2024 1403   CO2 29 06/22/2017 0853   GLUCOSE 93 08/14/2024 1403   GLUCOSE 159 (H) 06/22/2017 0853   BUN 22 08/14/2024 1403   BUN 16.7 06/22/2017 0853   CREATININE 0.99 08/14/2024 1403   CREATININE 1.05 (H) 07/26/2024 1026   CREATININE 0.8 06/22/2017 0853   CALCIUM 9.4 08/14/2024 1403   CALCIUM 9.9 06/22/2017 0853   PROT 6.2 (L) 07/26/2024 1026   PROT 6.4 06/22/2017 0853   ALBUMIN 4.2 07/26/2024 1026   ALBUMIN 3.8 06/22/2017 0853  AST 21 07/26/2024 1026   AST 21 06/22/2017 0853   ALT 10 07/26/2024 1026   ALT 19 06/22/2017 0853   ALKPHOS 68 07/26/2024 1026   ALKPHOS 65 06/22/2017 0853   BILITOT 0.5 07/26/2024 1026   BILITOT 0.60 06/22/2017 0853   GFRNONAA 57 (L) 07/26/2024 1026   GFRAA >60 06/22/2020 0939   Lab Results  Component Value Date   CHOL 137 08/14/2024   HDL 46.70 08/14/2024   LDLCALC 44 08/14/2024   LDLDIRECT 53.0 08/11/2022   TRIG 231.0 (H) 08/14/2024   CHOLHDL 3 08/14/2024   Lab Results  Component Value Date   HGBA1C 6.6 (H) 08/14/2024   Lab Results  Component Value Date   VITAMINB12 247 08/14/2024   Lab Results  Component Value Date   TSH 3.17 08/14/2024        ASSESSMENT AND PLAN  Multiple sclerosis - Plan: MR CERVICAL SPINE WO CONTRAST, MR BRAIN WO CONTRAST  Dysesthesia - Plan: MR CERVICAL SPINE WO CONTRAST, MR BRAIN WO CONTRAST  Diabetic polyneuropathy associated with type 2 diabetes mellitus (HCC)  CLL (chronic lymphocytic leukemia) (HCC)  In summary, Rachel Coffey is a 73 year old woman with multiple sclerosis and CLL.  She has been off of a disease modifying therapy since around 2011 and has been stable.  She has had a couple MRIs during that time with no apparent activity.  Most recent MRI was in 2021 and showed an overall low plaque burden.  We will check another MRI this year for comparison.  I discussed that if there is no progression or very minimal progression (just 1 or 2 lesions over 4 years), then I would recommend that she continue to remain off of a disease modifying therapy.  We discussed the natural history of MS.  Additionally, ibrutinib , though not formally tested in MS, is related to several BTK inhibitors currently in clinical trials and likely has some benefit for both relapsing and second progressive MS.  If there is more than just minimal progression of the MS, then I would recommend that we begin a disease modifying therapy.  As she needs to continue ibrutinib , we will consider adding a medication such as teriflunomide or dimethyl fumarate that would likely not significantly increase infectious risks symptomatically, she is doing well though she does have some deep muscle aches and some tingling (unclear if from minimal diabetic polyneuropathy or MS).  Baclofen  has helped the deep aches.  If this or dysesthesias worsen, consider adding gabapentin   She will return to see me in 1 year or sooner if there are new or worsening neurologic symptoms.  Thank you for asking me to see this patient.        Deone Omahoney A. Vear, MD, [facility] 09/09/2024, 9:54 AM Certified in Neurology, Clinical Neurophysiology, Sleep Medicine and  Neuroimaging  [facility] [address] [place], [place] [address] [phone]

## 2024-09-09 ENCOUNTER — Encounter: Payer: Self-pay | Admitting: Neurology

## 2024-09-09 ENCOUNTER — Ambulatory Visit: Admitting: Neurology

## 2024-09-09 VITALS — BP 122/64 | HR 84 | Ht 65.5 in | Wt 180.5 lb

## 2024-09-09 DIAGNOSIS — C9110 Chronic lymphocytic leukemia of B-cell type not having achieved remission: Secondary | ICD-10-CM | POA: Diagnosis not present

## 2024-09-09 DIAGNOSIS — R208 Other disturbances of skin sensation: Secondary | ICD-10-CM | POA: Diagnosis not present

## 2024-09-09 DIAGNOSIS — Z7984 Long term (current) use of oral hypoglycemic drugs: Secondary | ICD-10-CM | POA: Diagnosis not present

## 2024-09-09 DIAGNOSIS — E1142 Type 2 diabetes mellitus with diabetic polyneuropathy: Secondary | ICD-10-CM

## 2024-09-09 DIAGNOSIS — G35D Multiple sclerosis, unspecified: Secondary | ICD-10-CM | POA: Diagnosis not present

## 2024-09-10 ENCOUNTER — Other Ambulatory Visit (HOSPITAL_COMMUNITY): Payer: Self-pay

## 2024-09-12 ENCOUNTER — Other Ambulatory Visit: Payer: Self-pay

## 2024-09-15 ENCOUNTER — Encounter (INDEPENDENT_AMBULATORY_CARE_PROVIDER_SITE_OTHER): Payer: Self-pay

## 2024-09-16 ENCOUNTER — Encounter: Payer: Self-pay | Admitting: Radiology

## 2024-09-16 ENCOUNTER — Encounter: Payer: Self-pay | Admitting: Family Medicine

## 2024-09-16 ENCOUNTER — Other Ambulatory Visit: Payer: Self-pay | Admitting: Pharmacy Technician

## 2024-09-16 ENCOUNTER — Other Ambulatory Visit: Payer: Self-pay

## 2024-09-16 NOTE — Progress Notes (Signed)
 Specialty Pharmacy Refill Coordination Note  Rachel Coffey is a 73 y.o. female contacted today regarding refills of specialty medication(s) Ibrutinib  (IMBRUVICA )   Patient requested (Patient-Rptd) Delivery   Delivery date: 09/20/2024 Verified address: (Patient-Rptd) [address] [place] [address]   Medication will be filled on: 09/19/2024

## 2024-09-17 NOTE — Telephone Encounter (Signed)
 FYI COVID Vaccine updated.

## 2024-09-18 ENCOUNTER — Other Ambulatory Visit: Payer: Self-pay

## 2024-09-20 ENCOUNTER — Telehealth: Payer: Self-pay | Admitting: Pharmacy Technician

## 2024-09-20 NOTE — Telephone Encounter (Signed)
 Oral Oncology Patient Advocate Encounter  Was successful in securing patient a $8000 grant from [facility] to provide copayment coverage for Imbruvica .  This will keep the out of pocket expense at $0.     [employer] ID: [id number]   The billing information is as follows and has been shared with [facility].    RxBin: [id number] PCN: [id number] Member ID: [id number] Group ID: [id number] Dates of Eligibility: 08/21/2024 through 08/20/2025  Fund:  Chronic Lymphocytic Leukemia  Monte Bronder (Patty) Chet Burnet, CPhT  [facility], Zelda Salmon, [facility] - Oral Chemotherapy Patient Advocate Specialist III Phone: [phone]  Fax: [phone]

## 2024-09-23 ENCOUNTER — Other Ambulatory Visit: Payer: Self-pay

## 2024-09-23 ENCOUNTER — Encounter: Payer: Self-pay | Admitting: Neurology

## 2024-09-23 ENCOUNTER — Other Ambulatory Visit (INDEPENDENT_AMBULATORY_CARE_PROVIDER_SITE_OTHER): Payer: Self-pay

## 2024-09-23 ENCOUNTER — Ambulatory Visit (INDEPENDENT_AMBULATORY_CARE_PROVIDER_SITE_OTHER): Admitting: Orthopedic Surgery

## 2024-09-23 DIAGNOSIS — M25561 Pain in right knee: Secondary | ICD-10-CM

## 2024-09-23 DIAGNOSIS — M25562 Pain in left knee: Secondary | ICD-10-CM

## 2024-09-23 DIAGNOSIS — M25552 Pain in left hip: Secondary | ICD-10-CM

## 2024-09-23 NOTE — Progress Notes (Unsigned)
 Office Visit Note   Patient: Rachel Coffey           Date of Birth: 14-Sep-1951           MRN: [id number] Visit Date: 09/23/2024 Requested by: Johnny Garnette LABOR, MD [address] [place],  [place] [address] PCP: Johnny Garnette LABOR, MD  Subjective: Chief Complaint  Patient presents with   Other    Bilateral knee pain     HPI: [name] is a 73 y.o. female who presents to the office reporting low total orthopedic complaints.  She does report bilateral knee pain right worse than left.  She also reports left hip pain.  Patient states she had a fall in January 2025 when she did a split and she felt a muscle pulling in that left hip region.  She has tried extensive rehabilitation as well as a nonoperative conservative treatment program but still has pain in that hip flexor region.  Does report some weakness and giving way.  No prior knee surgery.  Patient states her knees feel fairly stiff most of the time.  Whenever she has any type of twisting event she has pain in the groin as well as proximal soreness.  Of note the patient does have CLL and takes immunotherapy.  She has a Baker's cyst behind the right knee but no DVT.  She has MS which she has not really been treated since 2012.  She takes Tylenol  arthritis for her symptoms about twice a day..                ROS: All systems reviewed are negative as they relate to the chief complaint within the history of present illness.  Patient denies fevers or chills.  Assessment & Plan: Visit Diagnoses:  1. Pain in both knees, unspecified chronicity   2. Pain in left hip     Plan: Impression is possible hip flexor strain versus occult arthritis which has not really improved in the past 9 months.  Based on her history of CLL I think MRI of the pelvis to evaluate a tear of the reflected head of the rectus is indicated.  She may also have occult arthritis.  In general the pelvic MRI would give us  a good comparison between the 2 hips.  Less likely  this is coming from her back.  Her knees have some mild arthritis but the hip is the biggest problem at this time.  We could consider injections into the knees when she comes back to review the MRI scan on the pelvis.  Follow-Up Instructions: No follow-ups on file.   Orders:  Orders Placed This Encounter  Procedures   XR KNEE 3 VIEW RIGHT   XR Knee 1-2 Views Left   XR HIP UNILAT W OR W/O PELVIS 2-3 VIEWS LEFT   MR Pelvis w/o contrast   No orders of the defined types were placed in this encounter.     Procedures: No procedures performed   Clinical Data: No additional findings.  Objective: Vital Signs: There were no vitals taken for this visit.  Physical Exam:  Constitutional: Patient appears well-developed HEENT:  Head: Normocephalic Eyes:EOM are normal Neck: Normal range of motion Cardiovascular: Normal rate Pulmonary/chest: Effort normal Neurologic: Patient is alert Skin: Skin is warm Psychiatric: Patient has normal mood and affect  Ortho Exam: Ortho exam demonstrates normal gait alignment.  She has no effusion in either knee with mild patellofemoral crepitus.  Collateral cruciate ligaments are stable.  Ankle dorsiflexion  foot plantarflexion quad hamstring strength testing 5+ out of 5 bilaterally.  Slight amount of groin pain on the left with internal/external Tatian of the leg but no real pain with resisted hip adduction or abduction.  Does have some pain with resisted hip flexion with the leg straight on that left-hand side.  No paresthesias L1 S1 bilaterally.  No trochanteric tenderness is noted.  Specialty Comments:  No specialty comments available.  Imaging: XR HIP UNILAT W OR W/O PELVIS 2-3 VIEWS LEFT Result Date: 09/23/2024 AP pelvis lateral radiograph left hip reviewed.  No acute fracture.  Joint spaces maintained.  Remainder bony pelvis normal  XR KNEE 3 VIEW RIGHT Result Date: 09/23/2024 AP lateral merchant radiograph right knee reviewed.  No acute  fracture.  Alignment intact.  Mild degenerative changes present in the lateral compartment and medial compartment with mild to moderate changes in the patellofemoral compartment.  Some lateral spurring noted.  XR Knee 1-2 Views Left Result Date: 09/23/2024 AP lateral merchant radiographs left knee reviewed.  Mild degenerative changes present in the lateral compartment and medial compartment.  Mild to moderate changes present in the patellofemoral compartment.  Some spurring is noted in the lateral compartment.  Overall alignment intact.  No acute fracture    PMFS History: Patient Active Problem List   Diagnosis Date Noted   Neuropathy of both feet 02/16/2022   Anxiety disorder 12/16/2020   Asthma without status asthmaticus 12/16/2020   Catarrhal nasal discharge 12/16/2020   Diarrhea 12/16/2020   Diverticular disease of colon 12/16/2020   Dyslipidemia 12/16/2020   Epigastric pain 12/16/2020   Gastroesophageal reflux disease 12/16/2020   Hardening of the aorta (main artery of the heart) 12/16/2020   History of lymphoma 12/16/2020   History of malignant neoplasm of skin 12/16/2020   Knee pain 12/16/2020   Lymphoma of intra-abdominal lymph nodes (HCC) 12/16/2020   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   Obesity 12/16/2020   Osteoarthritis 12/16/2020   Spasm 12/16/2020   Vitamin D  deficiency 12/16/2020   Lymphoma, small lymphocytic (HCC) 07/24/2019   Dysphonia 07/05/2018   Globus pharyngeus 06/19/2018   Sudden idiopathic hearing loss of left ear 05/31/2018   Temporomandibular joint (TMJ) pain 05/31/2018   Referred otalgia of left ear 05/24/2018   CLL (chronic lymphocytic leukemia) (HCC) 06/28/2016   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 Cloretta   Compression fracture of thoracic vertebra (HCC)    at T11 and T12, sees Dr. Arley Helling   Genital warts    Hyperlipidemia    MS (multiple sclerosis)    sees Dr. Norleen Lunger of [facility] at the [facility] office   Type 2 diabetes mellitus [facility])     Family History  Problem Relation Age of Onset   COPD Mother    Heart disease Mother    Diabetes Father    Heart disease Sister     Past Surgical  History:  Procedure Laterality Date   ABDOMINAL HYSTERECTOMY     with 1 ovary removed-[facility]   CHOLECYSTECTOMY N/A 02/25/2013   Procedure: LAPAROSCOPIC CHOLECYSTECTOMY WITH INTRAOPERATIVE CHOLANGIOGRAM;  Surgeon: Krystal CHRISTELLA Spinner, MD;  Location: [facility];  Service: General;  Laterality: N/A;   COLONOSCOPY  08/17/2023   per Dr. Belvie Just, adenomatous polyp, repeat in 7 yrs   IR KYPHO THORACIC WITH BONE BIOPSY  05/07/2021   TONSILLECTOMY     Social History   Occupational History   Not on file  Tobacco Use   Smoking status: Former    Current packs/day: 0.00    Types: Cigarettes    Quit date: 01/31/2005    Years since quitting: 19.6   Smokeless tobacco: Never  Vaping Use   Vaping status: Never Used  Substance and Sexual Activity   Alcohol use: No   Drug use: No   Sexual activity: Never    Partners: Male    Comment: [facility]--still has 1 ovary

## 2024-09-24 ENCOUNTER — Telehealth: Payer: Self-pay | Admitting: Orthopedic Surgery

## 2024-09-24 ENCOUNTER — Encounter: Payer: Self-pay | Admitting: Orthopedic Surgery

## 2024-09-24 NOTE — Telephone Encounter (Signed)
 Pt called stating that she got a call from [facility] for MRI but pt would like referral sent to [facility]. Pt phone number is [phone].

## 2024-09-24 NOTE — Telephone Encounter (Signed)
 This has been changed and was sent to Dewane Sor with [facility] to schedule.

## 2024-10-01 ENCOUNTER — Ambulatory Visit
Admission: RE | Admit: 2024-10-01 | Discharge: 2024-10-01 | Disposition: A | Discharge status: Home / Self Care | Source: Ambulatory Visit | Attending: Neurology | Admitting: Neurology

## 2024-10-01 ENCOUNTER — Ambulatory Visit
Admission: RE | Admit: 2024-10-01 | Discharge: 2024-10-01 | Disposition: A | Source: Ambulatory Visit | Attending: Neurology | Admitting: Neurology

## 2024-10-01 DIAGNOSIS — R208 Other disturbances of skin sensation: Secondary | ICD-10-CM

## 2024-10-01 DIAGNOSIS — G35D Multiple sclerosis, unspecified: Secondary | ICD-10-CM | POA: Diagnosis not present

## 2024-10-02 ENCOUNTER — Ambulatory Visit: Payer: Self-pay | Admitting: Neurology

## 2024-10-02 ENCOUNTER — Ambulatory Visit
Admission: RE | Admit: 2024-10-02 | Discharge: 2024-10-02 | Disposition: A | Discharge status: Home / Self Care | Source: Ambulatory Visit | Attending: Orthopedic Surgery | Admitting: Orthopedic Surgery

## 2024-10-02 DIAGNOSIS — M25552 Pain in left hip: Secondary | ICD-10-CM

## 2024-10-07 DIAGNOSIS — M25552 Pain in left hip: Secondary | ICD-10-CM | POA: Diagnosis not present

## 2024-10-11 ENCOUNTER — Other Ambulatory Visit (HOSPITAL_COMMUNITY): Payer: Self-pay

## 2024-10-11 ENCOUNTER — Other Ambulatory Visit: Payer: Self-pay | Admitting: Oncology

## 2024-10-11 DIAGNOSIS — C9110 Chronic lymphocytic leukemia of B-cell type not having achieved remission: Secondary | ICD-10-CM

## 2024-10-14 ENCOUNTER — Other Ambulatory Visit: Payer: Self-pay

## 2024-10-14 ENCOUNTER — Other Ambulatory Visit: Payer: Self-pay | Admitting: Pharmacy Technician

## 2024-10-14 ENCOUNTER — Other Ambulatory Visit: Payer: Self-pay | Admitting: Oncology

## 2024-10-14 ENCOUNTER — Other Ambulatory Visit (HOSPITAL_COMMUNITY): Payer: Self-pay

## 2024-10-14 DIAGNOSIS — C9110 Chronic lymphocytic leukemia of B-cell type not having achieved remission: Secondary | ICD-10-CM

## 2024-10-14 MED ORDER — IBRUTINIB 420 MG PO TABS
ORAL_TABLET | ORAL | 3 refills | Status: AC
  Filled 2024-10-14: qty 28, 28d supply, fill #0
  Filled 2024-11-05: qty 28, 28d supply, fill #1
  Filled 2024-12-04 (×2): qty 28, 28d supply, fill #2

## 2024-10-14 NOTE — Progress Notes (Signed)
 Specialty Pharmacy Refill Coordination Note  Rachel Coffey is a 73 y.o. female contacted today regarding refills of specialty medication(s) Imbruvica   Patient requested Delivery Delivery date: 10/17/2024 Verified address: [address] [place] [address]   Medication will be filled on: 10/16/2024  RR sent to MD on 11/28 & 12/01.  This fill date is pending response to refill request from provider. Patient is aware and if they have not received fill by intended date they must follow up with pharmacy.

## 2024-10-16 DIAGNOSIS — D485 Neoplasm of uncertain behavior of skin: Secondary | ICD-10-CM | POA: Diagnosis not present

## 2024-10-17 ENCOUNTER — Other Ambulatory Visit: Payer: Self-pay | Admitting: Family Medicine

## 2024-10-17 ENCOUNTER — Encounter: Payer: Self-pay | Admitting: Family Medicine

## 2024-10-17 ENCOUNTER — Other Ambulatory Visit: Payer: Self-pay

## 2024-10-17 ENCOUNTER — Ambulatory Visit: Admitting: Orthopedic Surgery

## 2024-10-17 DIAGNOSIS — M25552 Pain in left hip: Secondary | ICD-10-CM | POA: Diagnosis not present

## 2024-10-18 ENCOUNTER — Encounter: Payer: Self-pay | Admitting: Orthopedic Surgery

## 2024-10-18 MED ORDER — BACLOFEN 20 MG PO TABS: 20.0000 mg | ORAL_TABLET | Freq: Two times a day (BID) | ORAL | 3 refills | Status: DC

## 2024-10-18 NOTE — Progress Notes (Signed)
 Office Visit Note   Patient: Rachel Coffey           Date of Birth: [birth year]           MRN: [id number] Visit Date: 10/17/2024 Requested by: Johnny Garnette LABOR, MD [address] [place],  [place] [address] PCP: Johnny Garnette LABOR, MD  Subjective: Chief Complaint  Patient presents with   Other    Follow up to review MRI Pelvis    HPI: JAELEIGH MONACO is a 73 y.o. female who presents to the office reporting left hip pain in the adductor region.  This all started about 11 months ago doing a split.  Rotation hurts the leg when she is climbing into a high bed.  Localizes to the groin and adductor attachment region.  Certain movements are bad but in general most of the time she is asymptomatic.  She also has some restriction of range of motion in that left hip since the accident 11 months ago.  Since she was last seen she has had an MRI scan which is reviewed with the patient.  In general that scan shows no evidence of intra-articular injury or arthritis with symmetric and relatively absent hip effusions.  The muscles around the hip joint also showed no evidence of acute or subacute injury with edema..                ROS: All systems reviewed are negative as they relate to the chief complaint within the history of present illness.  Patient denies fevers or chills.  Assessment & Plan: Visit Diagnoses:  1. Pain in left hip     Plan: Impression is left hip pain with nothing structural in the bones or soft tissues around the left hip joint.  Particular attention is paid to the adductor attachment on the symphysis and no evidence of subacute or acute injury is present.  In general patient is most interested in making sure that there is no other occult process that could be causing his hip pain.  None is visualized on the MRI scan.  She may try to resume low level of resistive exercises at this time.  No contraindication to that based on clinical exam and radiographic findings.  Follow-Up  Instructions: No follow-ups on file.   Orders:  No orders of the defined types were placed in this encounter.  No orders of the defined types were placed in this encounter.     Procedures: No procedures performed   Clinical Data: No additional findings.  Objective: Vital Signs: There were no vitals taken for this visit.  Physical Exam:  Constitutional: Patient appears well-developed HEENT:  Head: Normocephalic Eyes:EOM are normal Neck: Normal range of motion Cardiovascular: Normal rate Pulmonary/chest: Effort normal Neurologic: Patient is alert Skin: Skin is warm Psychiatric: Patient has normal mood and affect  Ortho Exam: Ortho exam demonstrates normal gait alignment.  She has excellent hip abduction adduction and flexion strength.  Pedal pulses are palpable.  No groin pain with internal/external rotation of either leg.  When she tries to sit crosslegged had the right hip does come close to extending down near the floor the left hip less so in terms of having the knee reach the floor.  No popping or grinding with internal and external rotation of either hip.  No significant trochanteric tenderness left versus right.  Specialty Comments:  No specialty comments available.  Imaging: No results found.   PMFS History: Patient Active Problem List   Diagnosis Date  Noted   Neuropathy of both feet 02/16/2022   Anxiety disorder 12/16/2020   Asthma without status asthmaticus 12/16/2020   Catarrhal nasal discharge 12/16/2020   Diarrhea 12/16/2020   Diverticular disease of colon 12/16/2020   Dyslipidemia 12/16/2020   Epigastric pain 12/16/2020   Gastroesophageal reflux disease 12/16/2020   Hardening of the aorta (main artery of the heart) 12/16/2020   History of lymphoma 12/16/2020   History of malignant neoplasm of skin 12/16/2020   Knee pain 12/16/2020   Lymphoma of intra-abdominal lymph nodes (HCC) 12/16/2020   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   Obesity  12/16/2020   Osteoarthritis 12/16/2020   Spasm 12/16/2020   Vitamin D  deficiency 12/16/2020   Lymphoma, small lymphocytic (HCC) 07/24/2019   Dysphonia 07/05/2018   Globus pharyngeus 06/19/2018   Sudden idiopathic hearing loss of left ear 05/31/2018   Temporomandibular joint (TMJ) pain 05/31/2018   Referred otalgia of left ear 05/24/2018   CLL (chronic lymphocytic leukemia) (HCC) 06/28/2016   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   Lymphadenopathy, retroperitoneal 06/03/2016   Malignant melanoma of arm, right (HCC) 01/13/2016   Weakness of back 01/08/2014   Back pain, lumbosacral 01/08/2014   Cholelithiasis with cholecystitis 02/06/2013   Type 2 diabetes mellitus without complications (HCC) 04/02/2010   MULTIPLE SCLEROSIS 04/02/2010   ALLERGIC RHINITIS 04/02/2010   Asthma 04/02/2010   DIVERTICULITIS, HX OF 04/02/2010   Past Medical History:  Diagnosis Date   Asthma    stress induced with allergies   Baker's cyst of knee, left    Cancer (HCC)    CLL (chronic lymphocytic leukemia) (HCC)    sees Dr. Cloretta   Compression fracture of thoracic vertebra (HCC)    at T11 and T12, sees Dr. Arley Helling   Genital warts    Hyperlipidemia    MS (multiple sclerosis)    sees Dr. Norleen Lunger of [facility] at the [facility] office   Type 2 diabetes mellitus [facility])     Family History  Problem Relation Age of Onset   COPD Mother    Heart disease Mother    Diabetes Father    Heart disease Sister     Past Surgical History:  Procedure Laterality Date   ABDOMINAL HYSTERECTOMY     with 1 ovary removed-[facility]   CHOLECYSTECTOMY N/A 02/25/2013   Procedure: LAPAROSCOPIC CHOLECYSTECTOMY WITH INTRAOPERATIVE CHOLANGIOGRAM;  Surgeon: Krystal CHRISTELLA Spinner, MD;  Location: [facility];  Service: General;  Laterality: N/A;   COLONOSCOPY  08/17/2023   per Dr. Belvie Just, adenomatous polyp, repeat in 7 yrs   IR KYPHO THORACIC WITH BONE BIOPSY  05/07/2021   TONSILLECTOMY     Social History    Occupational History   Not on file  Tobacco Use   Smoking status: Former    Current packs/day: 0.00    Types: Cigarettes    Quit date: 01/31/2005    Years since quitting: 19.7   Smokeless tobacco: Never  Vaping Use   Vaping status: Never Used  Substance and Sexual Activity   Alcohol use: No   Drug use: No   Sexual activity: Never    Partners: Male    Comment: [facility]--still has 1 ovary

## 2024-10-21 ENCOUNTER — Encounter: Payer: Self-pay | Admitting: Family Medicine

## 2024-10-21 ENCOUNTER — Encounter: Payer: Self-pay | Admitting: Oncology

## 2024-11-05 ENCOUNTER — Other Ambulatory Visit (HOSPITAL_COMMUNITY): Payer: Self-pay

## 2024-11-05 ENCOUNTER — Other Ambulatory Visit: Payer: Self-pay

## 2024-11-05 NOTE — Progress Notes (Signed)
 Specialty Pharmacy Refill Coordination Note  Rachel Coffey is a 73 y.o. female contacted today regarding refills of specialty medication(s) Ibrutinib  (IMBRUVICA )   Patient requested Delivery   Delivery date: 11/12/24   Verified address: [address] [place] [address]   Medication will be filled on: 11/11/24

## 2024-11-11 ENCOUNTER — Other Ambulatory Visit: Payer: Self-pay

## 2024-11-12 ENCOUNTER — Other Ambulatory Visit: Payer: Self-pay | Admitting: Family Medicine

## 2024-11-15 ENCOUNTER — Encounter: Payer: Self-pay | Admitting: Family Medicine

## 2024-11-15 ENCOUNTER — Ambulatory Visit: Admitting: Family Medicine

## 2024-11-15 ENCOUNTER — Ambulatory Visit: Payer: Self-pay | Admitting: Family Medicine

## 2024-11-15 ENCOUNTER — Other Ambulatory Visit

## 2024-11-15 VITALS — BP 124/60 | HR 70 | Temp 98.3°F | Wt 178.8 lb

## 2024-11-15 DIAGNOSIS — E119 Type 2 diabetes mellitus without complications: Secondary | ICD-10-CM

## 2024-11-15 DIAGNOSIS — M545 Low back pain, unspecified: Secondary | ICD-10-CM

## 2024-11-15 DIAGNOSIS — F419 Anxiety disorder, unspecified: Secondary | ICD-10-CM

## 2024-11-15 DIAGNOSIS — Z7984 Long term (current) use of oral hypoglycemic drugs: Secondary | ICD-10-CM | POA: Diagnosis not present

## 2024-11-15 LAB — HEMOGLOBIN A1C: Hgb A1c MFr Bld: 6.2 % (ref 4.6–6.5)

## 2024-11-15 MED ORDER — BACLOFEN 20 MG PO TABS: 20.0000 mg | ORAL_TABLET | Freq: Two times a day (BID) | ORAL | 3 refills | Status: AC

## 2024-11-15 MED ORDER — ALPRAZOLAM 0.5 MG PO TABS: 0.5000 mg | ORAL_TABLET | Freq: Two times a day (BID) | ORAL | 1 refills | Status: AC

## 2024-11-15 NOTE — Progress Notes (Signed)
" ° °  Subjective:    Patient ID: Rachel Coffey, female    DOB: 02/20/1951, 74 y.o.   MRN: [id number]  HPI Here to follow up on back pain, type 2 diabetes, and anxiety. She is doing well overall. She takes Baclofen  twice daily, and she asks us  to assume prescribing this from Orthopedics. She also takes Xanax  twice daily, and she asks us  to assume prescribing this from Miquel Merritt NP in [place].    Review of Systems  Constitutional: Negative.   Respiratory: Negative.    Cardiovascular: Negative.   Musculoskeletal:  Positive for arthralgias and back pain.  Psychiatric/Behavioral: Negative.         Objective:   Physical Exam Constitutional:      Appearance: Normal appearance.  Cardiovascular:     Rate and Rhythm: Normal rate and regular rhythm.     Pulses: Normal pulses.     Heart sounds: Normal heart sounds.  Pulmonary:     Effort: Pulmonary effort is normal.     Breath sounds: Normal breath sounds.  Neurological:     Mental Status: She is alert.  Psychiatric:        Mood and Affect: Mood normal.        Behavior: Behavior normal.        Thought Content: Thought content normal.           Assessment & Plan:  Her anxiety is stable, so we refilled the Xanax . Her back pain is stable, so we refilled the Baclofen . For the type 2 diabetes, we will check an A1c. Garnette Olmsted, MD   "

## 2024-12-02 ENCOUNTER — Other Ambulatory Visit: Payer: Self-pay

## 2024-12-02 ENCOUNTER — Other Ambulatory Visit (HOSPITAL_COMMUNITY): Payer: Self-pay

## 2024-12-04 ENCOUNTER — Other Ambulatory Visit: Payer: Self-pay | Admitting: Pharmacy Technician

## 2024-12-04 ENCOUNTER — Other Ambulatory Visit: Payer: Self-pay

## 2024-12-04 ENCOUNTER — Other Ambulatory Visit (HOSPITAL_COMMUNITY): Payer: Self-pay

## 2024-12-06 ENCOUNTER — Other Ambulatory Visit (HOSPITAL_COMMUNITY): Payer: Self-pay

## 2024-12-09 ENCOUNTER — Other Ambulatory Visit: Payer: Self-pay | Admitting: Pharmacy Technician

## 2024-12-12 ENCOUNTER — Other Ambulatory Visit: Payer: Self-pay

## 2024-12-16 ENCOUNTER — Other Ambulatory Visit: Payer: Self-pay

## 2024-12-16 ENCOUNTER — Other Ambulatory Visit (HOSPITAL_COMMUNITY): Payer: Self-pay

## 2024-12-16 NOTE — Progress Notes (Signed)
 Specialty Pharmacy Ongoing Clinical Assessment Note  Rachel Coffey is a 74 y.o. female who is being followed by the specialty pharmacy service for RxSp Oncology   Patient's specialty medication(s) reviewed today: Ibrutinib  (IMBRUVICA )   Missed doses in the last 4 weeks: 0   Patient/Caregiver did not have any additional questions or concerns.   Therapeutic benefit summary: Patient is achieving benefit   Adverse events/side effects summary: Experienced adverse events/side effects (occasional bumps and bruising but very minor and resolve quickly)   Patient's therapy is appropriate to: Continue    Goals Addressed             This Visit's Progress    Achieve or maintain remission   On track    Patient is on track. Patient will maintain adherence. Patient is in clinical remission from CLL.          Follow up: 6 months  [facility]

## 2024-12-16 NOTE — Progress Notes (Signed)
 Specialty Pharmacy Refill Coordination Note  Rachel Coffey is a 74 y.o. female contacted today regarding refills of specialty medication(s) Ibrutinib  (IMBRUVICA )   Patient requested Delivery   Delivery date: 12/20/24   Verified address: [address]  [place] [address]   Medication will be filled on: 12/19/24

## 2024-12-18 ENCOUNTER — Telehealth: Payer: Self-pay | Admitting: Family Medicine

## 2024-12-18 NOTE — Telephone Encounter (Signed)
 Refilled Baclofen  for a 90 day supply

## 2024-12-19 ENCOUNTER — Other Ambulatory Visit: Payer: Self-pay

## 2025-01-20 ENCOUNTER — Ambulatory Visit: Admitting: Oncology

## 2025-01-20 ENCOUNTER — Other Ambulatory Visit

## 2025-10-06 ENCOUNTER — Ambulatory Visit: Admitting: Neurology
